# Patient Record
Sex: Female | Born: 1960 | Race: White | Hispanic: No | Marital: Married | Smoking: Former smoker
Health system: Southern US, Community
[De-identification: ages and names within clinical notes are randomized; demographics above are authoritative.]

## PROBLEM LIST (undated history)

## (undated) DIAGNOSIS — K648 Other hemorrhoids: Secondary | ICD-10-CM

## (undated) DIAGNOSIS — E162 Hypoglycemia, unspecified: Secondary | ICD-10-CM

## (undated) DIAGNOSIS — R7989 Other specified abnormal findings of blood chemistry: Secondary | ICD-10-CM

## (undated) DIAGNOSIS — E785 Hyperlipidemia, unspecified: Secondary | ICD-10-CM

## (undated) DIAGNOSIS — F419 Anxiety disorder, unspecified: Secondary | ICD-10-CM

## (undated) DIAGNOSIS — F32A Depression, unspecified: Secondary | ICD-10-CM

## (undated) DIAGNOSIS — F988 Other specified behavioral and emotional disorders with onset usually occurring in childhood and adolescence: Secondary | ICD-10-CM

## (undated) DIAGNOSIS — E119 Type 2 diabetes mellitus without complications: Secondary | ICD-10-CM

## (undated) DIAGNOSIS — IMO0002 Reserved for concepts with insufficient information to code with codable children: Secondary | ICD-10-CM

## (undated) DIAGNOSIS — K449 Diaphragmatic hernia without obstruction or gangrene: Secondary | ICD-10-CM

## (undated) DIAGNOSIS — R030 Elevated blood-pressure reading, without diagnosis of hypertension: Secondary | ICD-10-CM

## (undated) DIAGNOSIS — C801 Malignant (primary) neoplasm, unspecified: Secondary | ICD-10-CM

## (undated) DIAGNOSIS — K219 Gastro-esophageal reflux disease without esophagitis: Secondary | ICD-10-CM

## (undated) DIAGNOSIS — F329 Major depressive disorder, single episode, unspecified: Secondary | ICD-10-CM

## (undated) DIAGNOSIS — T7840XA Allergy, unspecified, initial encounter: Secondary | ICD-10-CM

## (undated) DIAGNOSIS — K8020 Calculus of gallbladder without cholecystitis without obstruction: Secondary | ICD-10-CM

## (undated) DIAGNOSIS — Q742 Other congenital malformations of lower limb(s), including pelvic girdle: Secondary | ICD-10-CM

## (undated) DIAGNOSIS — K2270 Barrett's esophagus without dysplasia: Secondary | ICD-10-CM

## (undated) DIAGNOSIS — K5790 Diverticulosis of intestine, part unspecified, without perforation or abscess without bleeding: Secondary | ICD-10-CM

## (undated) DIAGNOSIS — D649 Anemia, unspecified: Secondary | ICD-10-CM

## (undated) HISTORY — DX: Depression, unspecified: F32.A

## (undated) HISTORY — DX: Other specified behavioral and emotional disorders with onset usually occurring in childhood and adolescence: F98.8

## (undated) HISTORY — DX: Other congenital malformations of lower limb(s), including pelvic girdle: Q74.2

## (undated) HISTORY — DX: Type 2 diabetes mellitus without complications: E11.9

## (undated) HISTORY — DX: Anemia, unspecified: D64.9

## (undated) HISTORY — DX: Hypoglycemia, unspecified: E16.2

## (undated) HISTORY — DX: Anxiety disorder, unspecified: F41.9

## (undated) HISTORY — DX: Hyperlipidemia, unspecified: E78.5

## (undated) HISTORY — PX: HIATAL HERNIA REPAIR: SHX195

## (undated) HISTORY — DX: Gastro-esophageal reflux disease without esophagitis: K21.9

## (undated) HISTORY — DX: Barrett's esophagus without dysplasia: K22.70

## (undated) HISTORY — DX: Allergy, unspecified, initial encounter: T78.40XA

## (undated) HISTORY — DX: Calculus of gallbladder without cholecystitis without obstruction: K80.20

## (undated) HISTORY — DX: Diaphragmatic hernia without obstruction or gangrene: K44.9

## (undated) HISTORY — DX: Major depressive disorder, single episode, unspecified: F32.9

## (undated) HISTORY — DX: Other specified abnormal findings of blood chemistry: R79.89

## (undated) HISTORY — DX: Diverticulosis of intestine, part unspecified, without perforation or abscess without bleeding: K57.90

## (undated) HISTORY — PX: BREAST CYST EXCISION: SHX579

## (undated) HISTORY — DX: Elevated blood-pressure reading, without diagnosis of hypertension: R03.0

## (undated) HISTORY — DX: Reserved for concepts with insufficient information to code with codable children: IMO0002

## (undated) HISTORY — PX: LUMBAR LAMINECTOMY: SHX95

## (undated) HISTORY — DX: Other hemorrhoids: K64.8

## (undated) HISTORY — DX: Malignant (primary) neoplasm, unspecified: C80.1

## (undated) HISTORY — PX: CARDIAC CATHETERIZATION: SHX172

---

## 1999-03-04 ENCOUNTER — Encounter: Admission: RE | Admit: 1999-03-04 | Discharge: 1999-03-04 | Payer: Self-pay | Admitting: Obstetrics and Gynecology

## 1999-03-04 ENCOUNTER — Encounter: Payer: Self-pay | Admitting: Obstetrics and Gynecology

## 2000-03-04 ENCOUNTER — Encounter: Payer: Self-pay | Admitting: Obstetrics and Gynecology

## 2000-03-04 ENCOUNTER — Encounter: Admission: RE | Admit: 2000-03-04 | Discharge: 2000-03-04 | Payer: Self-pay | Admitting: Obstetrics and Gynecology

## 2000-12-01 ENCOUNTER — Ambulatory Visit (HOSPITAL_COMMUNITY): Admission: RE | Admit: 2000-12-01 | Discharge: 2000-12-02 | Payer: Self-pay | Admitting: Neurosurgery

## 2000-12-01 ENCOUNTER — Encounter: Payer: Self-pay | Admitting: Neurosurgery

## 2001-03-09 ENCOUNTER — Encounter: Admission: RE | Admit: 2001-03-09 | Discharge: 2001-03-09 | Payer: Self-pay | Admitting: Obstetrics and Gynecology

## 2001-03-09 ENCOUNTER — Encounter: Payer: Self-pay | Admitting: Obstetrics and Gynecology

## 2002-02-06 ENCOUNTER — Encounter: Payer: Self-pay | Admitting: Internal Medicine

## 2002-02-06 DIAGNOSIS — K648 Other hemorrhoids: Secondary | ICD-10-CM | POA: Insufficient documentation

## 2002-02-06 DIAGNOSIS — K5730 Diverticulosis of large intestine without perforation or abscess without bleeding: Secondary | ICD-10-CM | POA: Insufficient documentation

## 2002-02-06 HISTORY — PX: COLONOSCOPY: SHX174

## 2002-02-06 HISTORY — DX: Diverticulosis of large intestine without perforation or abscess without bleeding: K57.30

## 2002-03-15 ENCOUNTER — Encounter: Payer: Self-pay | Admitting: Obstetrics and Gynecology

## 2002-03-15 ENCOUNTER — Encounter: Admission: RE | Admit: 2002-03-15 | Discharge: 2002-03-15 | Payer: Self-pay | Admitting: Obstetrics and Gynecology

## 2003-03-25 ENCOUNTER — Encounter: Admission: RE | Admit: 2003-03-25 | Discharge: 2003-03-25 | Payer: Self-pay | Admitting: Obstetrics and Gynecology

## 2004-02-13 ENCOUNTER — Ambulatory Visit: Payer: Self-pay | Admitting: Family Medicine

## 2004-04-13 ENCOUNTER — Encounter: Admission: RE | Admit: 2004-04-13 | Discharge: 2004-04-13 | Payer: Self-pay | Admitting: Obstetrics and Gynecology

## 2004-04-14 ENCOUNTER — Ambulatory Visit: Payer: Self-pay | Admitting: Family Medicine

## 2004-04-15 ENCOUNTER — Ambulatory Visit: Payer: Self-pay | Admitting: Family Medicine

## 2004-05-13 ENCOUNTER — Ambulatory Visit: Payer: Self-pay | Admitting: Family Medicine

## 2004-05-14 ENCOUNTER — Ambulatory Visit: Payer: Self-pay | Admitting: Family Medicine

## 2004-05-18 ENCOUNTER — Ambulatory Visit: Payer: Self-pay | Admitting: Family Medicine

## 2004-05-26 ENCOUNTER — Ambulatory Visit: Payer: Self-pay | Admitting: Cardiology

## 2004-05-29 ENCOUNTER — Ambulatory Visit: Payer: Self-pay | Admitting: Cardiology

## 2004-06-04 ENCOUNTER — Inpatient Hospital Stay (HOSPITAL_BASED_OUTPATIENT_CLINIC_OR_DEPARTMENT_OTHER): Admission: RE | Admit: 2004-06-04 | Discharge: 2004-06-04 | Payer: Self-pay | Admitting: Cardiology

## 2004-06-04 ENCOUNTER — Ambulatory Visit: Payer: Self-pay | Admitting: Cardiology

## 2004-06-11 ENCOUNTER — Ambulatory Visit: Payer: Self-pay

## 2004-06-15 ENCOUNTER — Ambulatory Visit: Payer: Self-pay | Admitting: Family Medicine

## 2004-06-19 ENCOUNTER — Ambulatory Visit: Payer: Self-pay | Admitting: Cardiology

## 2004-06-24 ENCOUNTER — Ambulatory Visit: Payer: Self-pay | Admitting: Gastroenterology

## 2004-07-31 ENCOUNTER — Ambulatory Visit: Payer: Self-pay | Admitting: Gastroenterology

## 2004-07-31 ENCOUNTER — Encounter: Payer: Self-pay | Admitting: Gastroenterology

## 2004-08-10 ENCOUNTER — Ambulatory Visit: Payer: Self-pay | Admitting: Internal Medicine

## 2004-09-07 ENCOUNTER — Ambulatory Visit: Payer: Self-pay | Admitting: Gastroenterology

## 2004-09-07 ENCOUNTER — Ambulatory Visit: Payer: Self-pay | Admitting: Family Medicine

## 2004-12-07 ENCOUNTER — Ambulatory Visit: Payer: Self-pay | Admitting: Family Medicine

## 2004-12-23 ENCOUNTER — Ambulatory Visit: Payer: Self-pay | Admitting: Family Medicine

## 2005-01-04 ENCOUNTER — Ambulatory Visit: Payer: Self-pay | Admitting: Family Medicine

## 2005-01-26 ENCOUNTER — Ambulatory Visit: Payer: Self-pay | Admitting: Family Medicine

## 2005-02-05 ENCOUNTER — Ambulatory Visit: Payer: Self-pay | Admitting: Family Medicine

## 2005-04-20 ENCOUNTER — Encounter: Admission: RE | Admit: 2005-04-20 | Discharge: 2005-04-20 | Payer: Self-pay | Admitting: Obstetrics and Gynecology

## 2005-04-27 ENCOUNTER — Encounter: Admission: RE | Admit: 2005-04-27 | Discharge: 2005-04-27 | Payer: Self-pay | Admitting: Obstetrics and Gynecology

## 2005-11-02 ENCOUNTER — Encounter: Admission: RE | Admit: 2005-11-02 | Discharge: 2005-11-02 | Payer: Self-pay | Admitting: Family Medicine

## 2005-11-27 ENCOUNTER — Encounter (INDEPENDENT_AMBULATORY_CARE_PROVIDER_SITE_OTHER): Payer: Self-pay | Admitting: Internal Medicine

## 2005-11-27 LAB — CONVERTED CEMR LAB

## 2006-02-15 ENCOUNTER — Ambulatory Visit: Payer: Self-pay | Admitting: Family Medicine

## 2006-04-22 ENCOUNTER — Encounter: Admission: RE | Admit: 2006-04-22 | Discharge: 2006-04-22 | Payer: Self-pay | Admitting: Family Medicine

## 2006-06-24 ENCOUNTER — Ambulatory Visit: Payer: Self-pay | Admitting: Gastroenterology

## 2006-08-17 ENCOUNTER — Ambulatory Visit: Payer: Self-pay | Admitting: Family Medicine

## 2006-08-17 DIAGNOSIS — F411 Generalized anxiety disorder: Secondary | ICD-10-CM | POA: Insufficient documentation

## 2006-08-17 DIAGNOSIS — R7989 Other specified abnormal findings of blood chemistry: Secondary | ICD-10-CM | POA: Insufficient documentation

## 2006-08-17 LAB — CONVERTED CEMR LAB: Blood Glucose, Fingerstick: 114

## 2006-08-31 ENCOUNTER — Ambulatory Visit: Payer: Self-pay | Admitting: Family Medicine

## 2006-08-31 DIAGNOSIS — E7800 Pure hypercholesterolemia, unspecified: Secondary | ICD-10-CM | POA: Insufficient documentation

## 2006-08-31 DIAGNOSIS — E162 Hypoglycemia, unspecified: Secondary | ICD-10-CM | POA: Insufficient documentation

## 2006-08-31 DIAGNOSIS — F329 Major depressive disorder, single episode, unspecified: Secondary | ICD-10-CM | POA: Insufficient documentation

## 2006-09-01 LAB — CONVERTED CEMR LAB
AST: 22 units/L (ref 0–37)
BUN: 8 mg/dL (ref 6–23)
Calcium: 8.8 mg/dL (ref 8.4–10.5)
Chloride: 107 meq/L (ref 96–112)
Cholesterol: 196 mg/dL (ref 0–200)
Creatinine, Ser: 0.8 mg/dL (ref 0.4–1.2)
Glucose, Bld: 102 mg/dL — ABNORMAL HIGH (ref 70–99)
Potassium: 3.8 meq/L (ref 3.5–5.1)
Sodium: 140 meq/L (ref 135–145)
Total CHOL/HDL Ratio: 4.4

## 2006-09-21 ENCOUNTER — Telehealth (INDEPENDENT_AMBULATORY_CARE_PROVIDER_SITE_OTHER): Payer: Self-pay | Admitting: *Deleted

## 2006-09-27 ENCOUNTER — Ambulatory Visit: Payer: Self-pay | Admitting: Internal Medicine

## 2006-09-27 DIAGNOSIS — K219 Gastro-esophageal reflux disease without esophagitis: Secondary | ICD-10-CM | POA: Insufficient documentation

## 2007-03-01 ENCOUNTER — Ambulatory Visit: Payer: Self-pay | Admitting: Family Medicine

## 2007-03-09 LAB — CONVERTED CEMR LAB
Cholesterol: 269 mg/dL (ref 0–200)
Direct LDL: 177.3 mg/dL
HDL: 41.9 mg/dL (ref >39.0)
Total CHOL/HDL Ratio: 6.4
Triglycerides: 142 mg/dL (ref 0–149)
VLDL: 28 mg/dL (ref 0–40)

## 2007-03-10 ENCOUNTER — Ambulatory Visit: Payer: Self-pay | Admitting: Family Medicine

## 2007-03-10 DIAGNOSIS — J069 Acute upper respiratory infection, unspecified: Secondary | ICD-10-CM | POA: Insufficient documentation

## 2007-03-10 DIAGNOSIS — R030 Elevated blood-pressure reading, without diagnosis of hypertension: Secondary | ICD-10-CM | POA: Insufficient documentation

## 2007-03-10 LAB — CONVERTED CEMR LAB: Rapid Strep: NEGATIVE

## 2007-03-29 ENCOUNTER — Telehealth (INDEPENDENT_AMBULATORY_CARE_PROVIDER_SITE_OTHER): Payer: Self-pay | Admitting: *Deleted

## 2007-04-11 ENCOUNTER — Ambulatory Visit: Payer: Self-pay | Admitting: Internal Medicine

## 2007-04-13 ENCOUNTER — Encounter (INDEPENDENT_AMBULATORY_CARE_PROVIDER_SITE_OTHER): Payer: Self-pay | Admitting: Internal Medicine

## 2007-04-14 LAB — CONVERTED CEMR LAB
AST: 23 units/L (ref 0–37)
Cholesterol: 157 mg/dL (ref 0–200)
HDL: 48.8 mg/dL (ref >39.0)
VLDL: 25 mg/dL (ref 0–40)

## 2007-04-26 ENCOUNTER — Telehealth (INDEPENDENT_AMBULATORY_CARE_PROVIDER_SITE_OTHER): Payer: Self-pay | Admitting: Internal Medicine

## 2007-05-02 ENCOUNTER — Encounter (INDEPENDENT_AMBULATORY_CARE_PROVIDER_SITE_OTHER): Payer: Self-pay | Admitting: Internal Medicine

## 2007-05-26 ENCOUNTER — Ambulatory Visit: Payer: Self-pay | Admitting: Family Medicine

## 2007-05-26 ENCOUNTER — Encounter (INDEPENDENT_AMBULATORY_CARE_PROVIDER_SITE_OTHER): Payer: Self-pay | Admitting: Internal Medicine

## 2007-05-26 DIAGNOSIS — R1011 Right upper quadrant pain: Secondary | ICD-10-CM | POA: Insufficient documentation

## 2007-05-26 DIAGNOSIS — K8020 Calculus of gallbladder without cholecystitis without obstruction: Secondary | ICD-10-CM | POA: Insufficient documentation

## 2007-05-26 HISTORY — DX: Calculus of gallbladder without cholecystitis without obstruction: K80.20

## 2007-05-28 LAB — CONVERTED CEMR LAB: Lipase: 21 units/L (ref 11.0–59.0)

## 2007-06-01 ENCOUNTER — Encounter (INDEPENDENT_AMBULATORY_CARE_PROVIDER_SITE_OTHER): Payer: Self-pay | Admitting: Internal Medicine

## 2007-06-02 ENCOUNTER — Telehealth: Payer: Self-pay | Admitting: Family Medicine

## 2007-06-16 ENCOUNTER — Encounter (INDEPENDENT_AMBULATORY_CARE_PROVIDER_SITE_OTHER): Payer: Self-pay | Admitting: General Surgery

## 2007-06-16 ENCOUNTER — Ambulatory Visit (HOSPITAL_COMMUNITY): Admission: RE | Admit: 2007-06-16 | Discharge: 2007-06-16 | Payer: Self-pay | Admitting: General Surgery

## 2007-06-16 HISTORY — PX: CHOLECYSTECTOMY: SHX55

## 2007-07-18 ENCOUNTER — Encounter: Payer: Self-pay | Admitting: Family Medicine

## 2007-07-24 ENCOUNTER — Ambulatory Visit: Payer: Self-pay | Admitting: Family Medicine

## 2007-07-24 DIAGNOSIS — F988 Other specified behavioral and emotional disorders with onset usually occurring in childhood and adolescence: Secondary | ICD-10-CM | POA: Insufficient documentation

## 2007-07-28 ENCOUNTER — Encounter (INDEPENDENT_AMBULATORY_CARE_PROVIDER_SITE_OTHER): Payer: Self-pay | Admitting: Internal Medicine

## 2007-08-07 ENCOUNTER — Telehealth: Payer: Self-pay | Admitting: Family Medicine

## 2007-08-24 ENCOUNTER — Ambulatory Visit: Payer: Self-pay | Admitting: Family Medicine

## 2007-08-24 ENCOUNTER — Telehealth (INDEPENDENT_AMBULATORY_CARE_PROVIDER_SITE_OTHER): Payer: Self-pay | Admitting: Internal Medicine

## 2007-10-11 ENCOUNTER — Telehealth: Payer: Self-pay | Admitting: Family Medicine

## 2007-10-26 ENCOUNTER — Telehealth (INDEPENDENT_AMBULATORY_CARE_PROVIDER_SITE_OTHER): Payer: Self-pay | Admitting: Internal Medicine

## 2007-11-27 ENCOUNTER — Telehealth: Payer: Self-pay | Admitting: Family Medicine

## 2008-01-08 ENCOUNTER — Telehealth (INDEPENDENT_AMBULATORY_CARE_PROVIDER_SITE_OTHER): Payer: Self-pay | Admitting: Internal Medicine

## 2008-01-25 ENCOUNTER — Ambulatory Visit: Payer: Self-pay | Admitting: Family Medicine

## 2008-02-27 ENCOUNTER — Telehealth (INDEPENDENT_AMBULATORY_CARE_PROVIDER_SITE_OTHER): Payer: Self-pay | Admitting: *Deleted

## 2008-02-29 ENCOUNTER — Telehealth (INDEPENDENT_AMBULATORY_CARE_PROVIDER_SITE_OTHER): Payer: Self-pay | Admitting: *Deleted

## 2008-08-06 ENCOUNTER — Encounter (INDEPENDENT_AMBULATORY_CARE_PROVIDER_SITE_OTHER): Payer: Self-pay | Admitting: Internal Medicine

## 2008-08-06 ENCOUNTER — Ambulatory Visit: Payer: Self-pay | Admitting: Family Medicine

## 2008-08-06 DIAGNOSIS — R5381 Other malaise: Secondary | ICD-10-CM | POA: Insufficient documentation

## 2008-08-06 DIAGNOSIS — R5383 Other fatigue: Secondary | ICD-10-CM

## 2008-08-06 DIAGNOSIS — R079 Chest pain, unspecified: Secondary | ICD-10-CM | POA: Insufficient documentation

## 2008-08-07 ENCOUNTER — Ambulatory Visit: Payer: Self-pay | Admitting: Family Medicine

## 2008-08-07 ENCOUNTER — Encounter (INDEPENDENT_AMBULATORY_CARE_PROVIDER_SITE_OTHER): Payer: Self-pay | Admitting: Internal Medicine

## 2008-08-07 DIAGNOSIS — IMO0002 Reserved for concepts with insufficient information to code with codable children: Secondary | ICD-10-CM | POA: Insufficient documentation

## 2008-08-07 DIAGNOSIS — Q742 Other congenital malformations of lower limb(s), including pelvic girdle: Secondary | ICD-10-CM | POA: Insufficient documentation

## 2008-08-08 LAB — CONVERTED CEMR LAB
AST: 28 units/L (ref 0–37)
Alkaline Phosphatase: 60 units/L (ref 39–117)
BUN: 9 mg/dL (ref 6–23)
Basophils Absolute: 0 10*3/uL (ref 0.0–0.1)
CO2: 26 meq/L (ref 19–32)
Direct LDL: 110.9 mg/dL
Eosinophils Absolute: 0.2 10*3/uL (ref 0.0–0.7)
GFR calc non Af Amer: 80.89 mL/min (ref >60)
Glucose, Bld: 115 mg/dL — ABNORMAL HIGH (ref 70–99)
HCT: 35.9 % — ABNORMAL LOW (ref 36.0–46.0)
Hemoglobin: 12.1 g/dL (ref 12.0–15.0)
MCV: 76.4 fL — ABNORMAL LOW (ref 78.0–100.0)
Monocytes Absolute: 0.7 10*3/uL (ref 0.1–1.0)
Monocytes Relative: 8.2 % (ref 3.0–12.0)
Neutro Abs: 6.2 10*3/uL (ref 1.4–7.7)
RBC: 4.7 M/uL (ref 3.87–5.11)
Sodium: 141 meq/L (ref 135–145)
Total Bilirubin: 0.5 mg/dL (ref 0.3–1.2)
Triglycerides: 226 mg/dL — ABNORMAL HIGH (ref 0.0–149.0)
WBC: 8.9 10*3/uL (ref 4.5–10.5)

## 2008-08-09 ENCOUNTER — Encounter: Admission: RE | Admit: 2008-08-09 | Discharge: 2008-08-09 | Payer: Self-pay | Admitting: Family Medicine

## 2008-08-14 ENCOUNTER — Encounter (INDEPENDENT_AMBULATORY_CARE_PROVIDER_SITE_OTHER): Payer: Self-pay | Admitting: Internal Medicine

## 2008-09-19 ENCOUNTER — Ambulatory Visit: Payer: Self-pay | Admitting: Family Medicine

## 2008-09-19 DIAGNOSIS — R1310 Dysphagia, unspecified: Secondary | ICD-10-CM | POA: Insufficient documentation

## 2008-09-23 ENCOUNTER — Telehealth: Payer: Self-pay | Admitting: Family Medicine

## 2008-10-01 ENCOUNTER — Telehealth: Payer: Self-pay | Admitting: Family Medicine

## 2008-10-09 ENCOUNTER — Ambulatory Visit: Payer: Self-pay | Admitting: Family Medicine

## 2008-10-09 ENCOUNTER — Telehealth: Payer: Self-pay | Admitting: Internal Medicine

## 2008-10-09 DIAGNOSIS — K5900 Constipation, unspecified: Secondary | ICD-10-CM | POA: Insufficient documentation

## 2008-10-09 DIAGNOSIS — Z8719 Personal history of other diseases of the digestive system: Secondary | ICD-10-CM | POA: Insufficient documentation

## 2008-10-09 DIAGNOSIS — E785 Hyperlipidemia, unspecified: Secondary | ICD-10-CM | POA: Insufficient documentation

## 2008-10-09 DIAGNOSIS — E1169 Type 2 diabetes mellitus with other specified complication: Secondary | ICD-10-CM | POA: Insufficient documentation

## 2008-10-09 HISTORY — DX: Constipation, unspecified: K59.00

## 2008-10-10 ENCOUNTER — Ambulatory Visit: Payer: Self-pay | Admitting: Internal Medicine

## 2008-10-14 ENCOUNTER — Encounter: Payer: Self-pay | Admitting: Internal Medicine

## 2008-10-14 ENCOUNTER — Ambulatory Visit: Payer: Self-pay | Admitting: Internal Medicine

## 2008-10-16 ENCOUNTER — Telehealth: Payer: Self-pay | Admitting: Internal Medicine

## 2008-10-16 ENCOUNTER — Encounter: Payer: Self-pay | Admitting: Internal Medicine

## 2008-10-16 DIAGNOSIS — K2270 Barrett's esophagus without dysplasia: Secondary | ICD-10-CM | POA: Insufficient documentation

## 2008-10-16 DIAGNOSIS — K449 Diaphragmatic hernia without obstruction or gangrene: Secondary | ICD-10-CM | POA: Insufficient documentation

## 2008-10-16 HISTORY — DX: Diaphragmatic hernia without obstruction or gangrene: K44.9

## 2008-10-17 ENCOUNTER — Encounter: Payer: Self-pay | Admitting: Internal Medicine

## 2008-10-21 ENCOUNTER — Ambulatory Visit (HOSPITAL_COMMUNITY): Admission: RE | Admit: 2008-10-21 | Discharge: 2008-10-21 | Payer: Self-pay | Admitting: Internal Medicine

## 2008-10-22 ENCOUNTER — Encounter (INDEPENDENT_AMBULATORY_CARE_PROVIDER_SITE_OTHER): Payer: Self-pay | Admitting: Internal Medicine

## 2008-11-21 ENCOUNTER — Telehealth: Payer: Self-pay | Admitting: Internal Medicine

## 2008-11-28 ENCOUNTER — Ambulatory Visit: Payer: Self-pay | Admitting: Internal Medicine

## 2008-11-29 ENCOUNTER — Telehealth (INDEPENDENT_AMBULATORY_CARE_PROVIDER_SITE_OTHER): Payer: Self-pay | Admitting: Internal Medicine

## 2008-12-11 ENCOUNTER — Telehealth: Payer: Self-pay | Admitting: Internal Medicine

## 2008-12-16 ENCOUNTER — Telehealth: Payer: Self-pay | Admitting: Family Medicine

## 2008-12-17 ENCOUNTER — Telehealth (INDEPENDENT_AMBULATORY_CARE_PROVIDER_SITE_OTHER): Payer: Self-pay | Admitting: Internal Medicine

## 2008-12-20 ENCOUNTER — Telehealth (INDEPENDENT_AMBULATORY_CARE_PROVIDER_SITE_OTHER): Payer: Self-pay | Admitting: Internal Medicine

## 2008-12-23 ENCOUNTER — Ambulatory Visit: Payer: Self-pay | Admitting: Internal Medicine

## 2008-12-23 ENCOUNTER — Ambulatory Visit (HOSPITAL_COMMUNITY): Admission: RE | Admit: 2008-12-23 | Discharge: 2008-12-23 | Payer: Self-pay | Admitting: Internal Medicine

## 2009-01-06 ENCOUNTER — Encounter: Payer: Self-pay | Admitting: Family Medicine

## 2009-01-07 ENCOUNTER — Encounter (INDEPENDENT_AMBULATORY_CARE_PROVIDER_SITE_OTHER): Payer: Self-pay | Admitting: *Deleted

## 2009-01-10 ENCOUNTER — Encounter (INDEPENDENT_AMBULATORY_CARE_PROVIDER_SITE_OTHER): Payer: Self-pay | Admitting: Internal Medicine

## 2009-01-13 ENCOUNTER — Encounter (INDEPENDENT_AMBULATORY_CARE_PROVIDER_SITE_OTHER): Payer: Self-pay | Admitting: Internal Medicine

## 2009-01-22 ENCOUNTER — Encounter: Admission: RE | Admit: 2009-01-22 | Discharge: 2009-01-22 | Payer: Self-pay | Admitting: Neurosurgery

## 2009-02-05 ENCOUNTER — Encounter: Admission: RE | Admit: 2009-02-05 | Discharge: 2009-02-05 | Payer: Self-pay | Admitting: Neurosurgery

## 2009-02-11 ENCOUNTER — Encounter (INDEPENDENT_AMBULATORY_CARE_PROVIDER_SITE_OTHER): Payer: Self-pay | Admitting: *Deleted

## 2009-02-12 ENCOUNTER — Inpatient Hospital Stay (HOSPITAL_COMMUNITY): Admission: AD | Admit: 2009-02-12 | Discharge: 2009-02-14 | Payer: Self-pay | Admitting: Surgery

## 2009-02-13 ENCOUNTER — Encounter (INDEPENDENT_AMBULATORY_CARE_PROVIDER_SITE_OTHER): Payer: Self-pay | Admitting: Internal Medicine

## 2009-02-28 ENCOUNTER — Ambulatory Visit: Payer: Self-pay | Admitting: Family Medicine

## 2009-02-28 DIAGNOSIS — D6489 Other specified anemias: Secondary | ICD-10-CM | POA: Insufficient documentation

## 2009-02-28 LAB — CONVERTED CEMR LAB
Basophils Relative: 0.8 % (ref 0.0–3.0)
Eosinophils Relative: 3.4 % (ref 0.0–5.0)
HCT: 36.7 % (ref 36.0–46.0)
Lymphocytes Relative: 29.1 % (ref 12.0–46.0)
MCHC: 32.9 g/dL (ref 30.0–36.0)
MCV: 83.3 fL (ref 78.0–100.0)
RDW: 16.3 % — ABNORMAL HIGH (ref 11.5–14.6)

## 2009-03-04 ENCOUNTER — Ambulatory Visit: Payer: Self-pay | Admitting: Family Medicine

## 2009-06-06 ENCOUNTER — Ambulatory Visit: Payer: Self-pay | Admitting: Family Medicine

## 2009-06-06 LAB — CONVERTED CEMR LAB
AST: 24 units/L (ref 0–37)
Alkaline Phosphatase: 58 units/L (ref 39–117)
Bilirubin, Direct: 0 mg/dL (ref 0.0–0.3)
Chloride: 112 meq/L (ref 96–112)
Cholesterol: 143 mg/dL (ref 0–200)
Glucose, Bld: 120 mg/dL — ABNORMAL HIGH (ref 70–99)
HDL: 53.3 mg/dL (ref >39.00)
Potassium: 4 meq/L (ref 3.5–5.1)
Sodium: 143 meq/L (ref 135–145)
Total Bilirubin: 0.5 mg/dL (ref 0.3–1.2)
Total CHOL/HDL Ratio: 3
VLDL: 25.4 mg/dL (ref 0.0–40.0)

## 2009-07-01 ENCOUNTER — Telehealth: Payer: Self-pay | Admitting: Family Medicine

## 2009-07-08 ENCOUNTER — Ambulatory Visit: Payer: Self-pay | Admitting: Family Medicine

## 2009-07-08 DIAGNOSIS — M7730 Calcaneal spur, unspecified foot: Secondary | ICD-10-CM | POA: Insufficient documentation

## 2009-07-08 DIAGNOSIS — E669 Obesity, unspecified: Secondary | ICD-10-CM | POA: Insufficient documentation

## 2009-07-09 ENCOUNTER — Encounter: Payer: Self-pay | Admitting: Family Medicine

## 2009-10-14 ENCOUNTER — Encounter: Admission: RE | Admit: 2009-10-14 | Discharge: 2009-10-14 | Payer: Self-pay | Admitting: Family Medicine

## 2009-11-04 ENCOUNTER — Encounter (INDEPENDENT_AMBULATORY_CARE_PROVIDER_SITE_OTHER): Payer: Self-pay | Admitting: *Deleted

## 2009-11-24 ENCOUNTER — Telehealth: Payer: Self-pay | Admitting: Family Medicine

## 2009-11-24 ENCOUNTER — Ambulatory Visit: Payer: Self-pay | Admitting: Family Medicine

## 2009-12-04 ENCOUNTER — Ambulatory Visit: Payer: Self-pay | Admitting: Family Medicine

## 2009-12-04 DIAGNOSIS — L03119 Cellulitis of unspecified part of limb: Secondary | ICD-10-CM

## 2009-12-04 DIAGNOSIS — L02419 Cutaneous abscess of limb, unspecified: Secondary | ICD-10-CM | POA: Insufficient documentation

## 2009-12-09 ENCOUNTER — Ambulatory Visit: Payer: Self-pay | Admitting: Family Medicine

## 2009-12-10 ENCOUNTER — Encounter: Payer: Self-pay | Admitting: Family Medicine

## 2009-12-10 LAB — CONVERTED CEMR LAB
ALT: 17 units/L (ref 0–35)
HDL: 45.6 mg/dL (ref >39.00)
LDL Cholesterol: 99 mg/dL (ref 0–99)
Total CHOL/HDL Ratio: 4

## 2009-12-25 ENCOUNTER — Ambulatory Visit: Payer: Self-pay | Admitting: Family Medicine

## 2009-12-29 ENCOUNTER — Ambulatory Visit: Payer: Self-pay | Admitting: Family Medicine

## 2010-01-26 ENCOUNTER — Telehealth: Payer: Self-pay | Admitting: Family Medicine

## 2010-04-18 ENCOUNTER — Encounter: Payer: Self-pay | Admitting: Obstetrics and Gynecology

## 2010-04-19 ENCOUNTER — Encounter: Payer: Self-pay | Admitting: Family Medicine

## 2010-04-28 NOTE — Progress Notes (Signed)
Summary: Phenteramine  Phone Note Refill Request Message from:  Patient on July 01, 2009 10:04 AM  Refills Requested: Medication #1:  PHENTERMINE HCL 15 MG CAPS 1 tab by mouth every morning before breakfast.. [facility] [place]  [phone]   Patient has appt. next week but will run out of medication on Saturday.   Method Requested: Electronic Initial call taken by: Delilah Shan CMA Duncan Dull),  July 01, 2009 10:04 AM  Follow-up for Phone Call        Medication phoned to pharmacy.  Follow-up by: Delilah Shan CMA (AAMA),  July 01, 2009 10:42 AM    Prescriptions: PHENTERMINE HCL 15 MG CAPS (PHENTERMINE HCL) 1 tab by mouth every morning before breakfast.  #30 x 0   Entered and Authorized by:   Ruthe Mannan MD   Signed by:   Ruthe Mannan MD on 07/01/2009   Method used:   Handwritten   RxID:   [id number]

## 2010-04-28 NOTE — Consult Note (Signed)
Summary: [facility]   Imported By: Lanelle Bal 08/13/2009 11:55:04  _____________________________________________________________________  External Attachment:    Type:   Image     Comment:   External Document

## 2010-04-28 NOTE — Assessment & Plan Note (Signed)
Summary: 30 MIN F/U/[facility]   Vital Signs:  Patient profile:   50 year old female Height:      66.5 inches Weight:      204.13 pounds BMI:     32.57 Temp:     98.3 degrees F oral Pulse rate:   84 / minute Pulse rhythm:   regular BP sitting:   116 / 80  (left arm) Cuff size:   large  Vitals Entered By: Delilah Shan CMA Duncan Dull) (June 06, 2009 8:44 AM) CC: 30 minute follow up (BDB), Depression   History of Present Illness: 50 yo pt new to me here to establish care ([facility]).  HLD- has been on Vytorin, doing well.  Has not had lipid panel checked since May 2010, at that time had elevated TGs.  Obesity- has really been working on weight loss.  Lost 20 pounds 6 months ago but gained it all back.  Exercising 4-5 days a week for at least 30 minutes.  Trying to cut out carbohydrates, really feels her diet has improved but she is stuck.  She is interested in weight loss supplements.  No h/o HTN, palpitations.  Depression History:      The patient denies insomnia, hypersomnia, psychomotor agitation, psychomotor retardation, fatigue (loss of energy), feelings of worthlessness (guilt), impaired concentration (indecisiveness), and recurrent thoughts of death or suicide.         Allergies: 1)  Zoloft (Sertraline Hcl)  Review of Systems      See HPI General:  Denies malaise. GI:  Denies abdominal pain and change in bowel habits. Derm:  Denies rash. Psych:  Denies easily angered, easily tearful, irritability, mental problems, panic attacks, sense of great danger, suicidal thoughts/plans, thoughts of violence, unusual visions or sounds, and thoughts /plans of harming others. Endo:  Denies cold intolerance and heat intolerance.  Physical Exam  General:  Well developed, well nourished, no acute distress. weight stable. Mouth:  No deformity or lesions, dentition normal. Lungs:  Clear throughout to auscultation. Heart:  Regular rate and rhythm; no murmurs, rubs,  or  bruits. Abdomen:  soft abdomen minimal tenderness in epigastrium normoactive bowel sounds. No distention.  Extremities:  No clubbing, cyanosis, edema or deformities noted. Psych:  Alert and cooperative. Normal mood and affect.   Impression & Recommendations:  Problem # 1:  HYPERLIPIDEMIA (ICD-272.4) Assessment Unchanged reheck FLP today along with BMET and LFTs given elevated TGs. Her updated medication list for this problem includes:    Vytorin 10-40 Mg Tabs (Ezetimibe-simvastatin) .Marland Kitchen... Take one by mouth daily  Orders: Venipuncture ([address]) TLB-Lipid Panel (80061-LIPID) TLB-Hepatic/Liver Function Pnl (80076-HEPATIC) TLB-BMP (Basic Metabolic Panel-BMET) (80048-METABOL)  Problem # 2:  DEPRESSION (ICD-311) Assessment: Unchanged stable.  Continue current meds. The following medications were removed from the medication list:    Alprazolam 0.25 Mg Tabs (Alprazolam) .Marland Kitchen... Take 1 tablet by mouth once a day prn Her updated medication list for this problem includes:    Effexor 37.5 Mg Tabs (Venlafaxine hcl) .Marland Kitchen... Take 2  tablet by mouth once a day  Problem # 3:  OBESITY, UNSPECIFIED (ICD-278.00) Assessment: Unchanged Time spent with patient 25 minutes, more than 50% of this time was spent counseling patient on weight loss strategies. Will try phenteramine, discussed side effects and monitoring, given up to date hand out. She will keep a 24 hour food recall for the next month and come see me in one month.  Complete Medication List: 1)  Vytorin 10-40 Mg Tabs (Ezetimibe-simvastatin) .... Take one by mouth  daily 2)  Effexor 37.5 Mg Tabs (Venlafaxine hcl) .... Take 2  tablet by mouth once a day 3)  Phentermine Hcl 15 Mg Caps (Phentermine hcl) .Marland Kitchen.. 1 tab by mouth every morning before breakfast. Prescriptions: PHENTERMINE HCL 15 MG CAPS (PHENTERMINE HCL) 1 tab by mouth every morning before breakfast.  #30 x 0   Entered and Authorized by:   Ruthe Mannan MD   Signed by:   Ruthe Mannan MD on  06/06/2009   Method used:   Print then Give to Patient   RxID:   [id number]   Current Allergies (reviewed today): ZOLOFT (SERTRALINE HCL)  Flex Sig Next Due:  Not Indicated Last Colonoscopy:  Location:  [facility].   (02/06/2002 4:11:21 PM) Colonoscopy Result Date:  02/06/2009 Colonoscopy Next Due:  5 yr Hemoccult Next Due:  Not Indicated

## 2010-04-28 NOTE — Assessment & Plan Note (Signed)
Summary: LEGS/DLO   Vital Signs:  Patient profile:   50 year old female Height:      66.5 inches Weight:      205 pounds BMI:     32.71 Temp:     98.6 degrees F oral Pulse rate:   76 / minute Pulse rhythm:   regular BP sitting:   110 / 70  (right arm) Cuff size:   regular  Vitals Entered By: Linde Gillis CMA Duncan Dull) (December 04, 2009 4:00 PM) CC: fell a few days ago and hurt leg and knees   History of Present Illness: 50 yo here s/p fall.  [place]y Seat in parking lot three days ago, tripped over a curb. Crapped both of her knees and left shin on the pavement. Noticed two days ago some warmth and redness around scabs, redness is streaking. No fevers, chills, nausea or vomiting.  Not draining pus.  Putting neosporin on scrapes.  Current Medications (verified): 1)  Effexor 37.5 Mg  Tabs (Venlafaxine Hcl) .... Take 2  Tablet By Mouth Once A Day 2)  Phentermine Hcl 15 Mg Caps (Phentermine Hcl) .Marland Kitchen.. 1 Tab By Mouth Every Morning Before Breakfast and 1 Tab Before Lunch 3)  Simvastatin 40 Mg Tabs (Simvastatin) .... Take One Tablet At Bedtime 4)  Venlafaxine Hcl 37.5 Mg Tabs (Venlafaxine Hcl) .Marland Kitchen.. 1 Tab By Two Times A Day. 5)  Doxycycline Hyclate 100 Mg Caps (Doxycycline Hyclate) .... 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) HERNIATED DISC (ICD-722.2) FATIGUE (ICD-780.79) HEALTH SCREENING (ICD-V70.0) CHEST PAIN (ICD-786.50) ADD (ICD-314.00) CHOLELITHIASIS (ICD-574.20) RUQ PAIN (ICD-789.01) ELEVATED BLOOD PRESSURE WITHOUT DIAGNOSIS OF HYPERTENSION (ICD-796.2) URI (ICD-465.9) G E R D (ICD-530.81) HYPERCHOLESTEROLEMIA, PURE  (ICD-272.0) DEPRESSION (ICD-311) ANXIETY (ICD-300.00) HYPOGLYCEMIA (ICD-251.2) ANXIETY (ICD-300.00) HYPERGLYCEMIA (ICD-790.6)    Past Surgical History: Last updated: 02/13/2009 Laparoscopic Choleycystectomy (Dr Lindie Spruce) 06/16/2007 Colonoscopy showing diverticula and internal hemorrhoids 02/06/2002 ( Dr. Juanda Chance) Cardiolite normal with ef 76% 02/14/2002 Echo EF WNL TR M.R.,T.R 05/2004 C-Section Left Breast Cystectomy Laminectomy EGD--09/2008--hiatal hernia, esophagitis/Barrett's on bx 02/12/2009--laprosscopic fundoplication with repair of hiatal hernia--[facility]  Family History: Last updated: 10/10/2008 Father:   bypass surg hypertension, diabetes Mother: alive Siblings: one younger sister CV + MGM died with MI, PGF died with MI HBP + father DM + father and Mat aunt Breast/ovarian/uterine cancer none Depression none ETOH/drug abuse none Family History of Colon Cancer: PGM Family History of Prostate Cancer: MGF Family History of Colon Polyps: MGM, Mother  Social History: Last updated: 10/10/2008 Marital Status: Married Children: two, 1 girl, 1 boy Occupation: Film/video editor, 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  Alcohol Use - no Daily Caffeine Use 1 cup Illicit Drug Use - no  Risk Factors: Caffeine Use: 2 (03/10/2007) Exercise: no (03/10/2007)  Risk Factors: Smoking Status: quit (10/10/2008) Passive Smoke Exposure: no (03/10/2007)  Review of Systems      See HPI General:  Denies chills and fever. GI:  Denies abdominal pain, nausea, and vomiting.  Physical Exam  General:  Well developed, well nourished, no acute distress.  Skin:  multiple abrasions, scabbed over on bilateral lower extremities, largest on left shin, 6 inches long. Surrounded by warmth and streaking erythema, non tender to palpation, no drainage. Psych:  Alert and cooperative. Normal mood  and affect.   Impression & Recommendations:  Problem # 1:  CELLULITIS, LEGS  (ICD-682.6) Assessment New Treat with doxycycline 100 mg two times a day. Advised pt to needs to be seen immediately if she spikes a temperature or develops any other systemic symptoms such as nausea, vomiting. Her updated medication list for this problem includes:    Doxycycline Hyclate 100 Mg Caps (Doxycycline hyclate) .Marland Kitchen... Take 1 tab twice a day x 10 days  Complete Medication List: 1)  Effexor 37.5 Mg Tabs (Venlafaxine hcl) .... Take 2  tablet by mouth once a day 2)  Phentermine Hcl 15 Mg Caps (Phentermine hcl) .Marland Kitchen.. 1 tab by mouth every morning before breakfast and 1 tab before lunch 3)  Simvastatin 40 Mg Tabs (Simvastatin) .... Take one tablet at bedtime 4)  Venlafaxine Hcl 37.5 Mg Tabs (Venlafaxine hcl) .Marland Kitchen.. 1 tab by two times a day. 5)  Doxycycline Hyclate 100 Mg Caps (Doxycycline hyclate) .... Take 1 tab twice a day x 10 days Prescriptions: DOXYCYCLINE HYCLATE 100 MG CAPS (DOXYCYCLINE HYCLATE) Take 1 tab twice a day x 10 days  #20 x 0   Entered and Authorized by:   Ruthe Mannan MD   Signed by:   Ruthe Mannan MD on 12/04/2009   Method used:   [facility]        [facility]  [place] (retail)       [address]       [place], [place]  [address]       Ph: [phone] or [phone]       Fax: [phone]   RxID:   [id number]   Current Allergies (reviewed today): ZOLOFT (SERTRALINE HCL)

## 2010-04-28 NOTE — Letter (Signed)
Summary: Generic Letter  [place] [facility]  [address] [place], [place] [address]   Phone: [phone]  Fax: [phone]    12/10/2009  ZENORA KARPEL [address] [place], [place]  [address]  Dear Ms. Rosetti,     We have received your lab results and your cholesterol and liver function looks great!  Enclosed you will find a copy of your labs.      Sincerely,   Linde Gillis CMA [facility]) for Dr. Ruthe Mannan

## 2010-04-28 NOTE — Assessment & Plan Note (Signed)
Summary: F/U/DLO   Vital Signs:  Patient profile:   50 year old female Height:      66.5 inches Weight:      204 pounds BMI:     32.55 Temp:     98.3 degrees F oral Pulse rate:   68 / minute Pulse rhythm:   regular BP sitting:   102 / 60  (left arm) Cuff size:   regular  Vitals Entered By: Linde Gillis CMA Duncan Dull) (December 29, 2009 8:56 AM) CC: follow-up visit   History of Present Illness: 50 yo pt  here to discuss medications.  Obesity- has really been working on weight loss.  Lost 20 pounds last year but gained it all back.  Exercising 4-5 days a week for at least 30 minutes.  We started Phenteramine 15 mg daily several months ago.  Did not loose much weight, was not exercisign than and not eating right.  Did not have paliptations, nausea, CP, SOB. Tried it again, still has not lost weight.  She knows that she needs to find time to exercise.     Depression- feels like she is doing fine on generic Venlafaxine.  Denies any signs or symptoms of depression or anxiety.  Current Medications (verified): 1)  Simvastatin 40 Mg Tabs (Simvastatin) .... Take One Tablet At Bedtime 2)  Venlafaxine Hcl 37.5 Mg Tabs (Venlafaxine Hcl) .Marland Kitchen.. 1 Tab By Two Times A Day.  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) HERNIATED DISC (ICD-722.2) FATIGUE (ICD-780.79) HEALTH SCREENING (ICD-V70.0) CHEST PAIN (ICD-786.50) ADD (ICD-314.00) CHOLELITHIASIS (ICD-574.20) RUQ PAIN (ICD-789.01) ELEVATED BLOOD PRESSURE WITHOUT DIAGNOSIS OF HYPERTENSION (ICD-796.2) URI (ICD-465.9) G E R D (ICD-530.81) HYPERCHOLESTEROLEMIA, PURE (ICD-272.0) DEPRESSION (ICD-311) ANXIETY  (ICD-300.00) HYPOGLYCEMIA (ICD-251.2) ANXIETY (ICD-300.00) HYPERGLYCEMIA (ICD-790.6)    Past Surgical History: Last updated: 02/13/2009 Laparoscopic Choleycystectomy (Dr Lindie Spruce) 06/16/2007 Colonoscopy showing diverticula and internal hemorrhoids 02/06/2002 ( Dr. Juanda Chance) Cardiolite normal with ef 76% 02/14/2002 Echo EF WNL TR M.R.,T.R 05/2004 C-Section Left Breast Cystectomy Laminectomy EGD--09/2008--hiatal hernia, esophagitis/Barrett's on bx 02/12/2009--laprosscopic fundoplication with repair of hiatal hernia--[facility]  Family History: Last updated: 10/10/2008 Father:   bypass surg hypertension, diabetes Mother: alive Siblings: one younger sister CV + MGM died with MI, PGF died with MI HBP + father DM + father and Mat aunt Breast/ovarian/uterine cancer none Depression none ETOH/drug abuse none Family History of Colon Cancer: PGM Family History of Prostate Cancer: MGF Family History of Colon Polyps: MGM, Mother  Social History: Last updated: 10/10/2008 Marital Status: Married Children: two, 1 girl, 1 boy Occupation: Film/video editor, 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  Alcohol Use - no Daily Caffeine Use 1 cup Illicit Drug Use - no  Risk Factors: Caffeine Use: 2 (03/10/2007) Exercise: no (03/10/2007)  Risk Factors: Smoking Status: quit (10/10/2008) Passive Smoke Exposure: no (03/10/2007)  Review of Systems      See HPI General:  Denies malaise. Eyes:  Denies blurring. ENT:  Denies difficulty swallowing. CV:  Denies chest pain or discomfort. Resp:  Denies shortness of breath. Psych:  Denies anxiety and depression.  Physical Exam  General:  Well developed, well nourished, no acute distress.  Mouth:  No deformity or lesions, dentition normal. Lungs:  Clear throughout to auscultation. Heart:  Regular rate and rhythm; no murmurs, rubs,  or bruits. Psych:  Alert  and cooperative. Normal mood and affect.   Impression &  Recommendations:  Problem # 1:  DEPRESSION (ICD-311) Assessment Unchanged Stable.  continue Venflafaxine. The following medications were removed from the medication list:    Effexor 37.5 Mg Tabs (Venlafaxine hcl) .Marland Kitchen... Take 2  tablet by mouth once a day Her updated medication list for this problem includes:    Venlafaxine Hcl 37.5 Mg Tabs (Venlafaxine hcl) .Marland Kitchen... 1 tab by two times a day.  Problem # 2:  OBESITY (ICD-278.00) Assessment: Unchanged D/c Phenteramine.  Discussed exercise and possible rejoining weight watchers.  Complete Medication List: 1)  Simvastatin 40 Mg Tabs (Simvastatin) .... Take one tablet at bedtime 2)  Venlafaxine Hcl 37.5 Mg Tabs (Venlafaxine hcl) .Marland Kitchen.. 1 tab by two times a day.  Current Allergies (reviewed today): ZOLOFT (SERTRALINE HCL)

## 2010-04-28 NOTE — Assessment & Plan Note (Signed)
Summary: DISCUSS MEDICATION/CLE   Vital Signs:  Patient profile:   50 year old female Weight:      203.25 pounds Temp:     98.8 degrees F oral Pulse rate:   80 / minute Pulse rhythm:   regular BP sitting:   110 / 78  (left arm) Cuff size:   large  Vitals Entered By: Sydell Axon LPN (November 24, 2009 11:02 AM) CC: Discuss changing her Effexor to a generic   History of Present Illness: 50 yo pt  here to discuss medications.  Obesity- has really been working on weight loss.  Lost 20 pounds last year but gained it all back.  Exercising 4-5 days a week for at least 30 minutes.  We started Phenteramine 15 mg daily several months ago.  Did not loose much weight, was not exercisign than and not eating right.  Did not have paliptations, nausea, CP, SOB. Would like to try again.  Depression- stable on Effexor but would like to try generic.  Called [facility] and they do carry generic effexor.  Current Medications (verified): 1)  Effexor 37.5 Mg  Tabs (Venlafaxine Hcl) .... Take 2  Tablet By Mouth Once A Day 2)  Phentermine Hcl 15 Mg Caps (Phentermine Hcl) .Marland Kitchen.. 1 Tab By Mouth Every Morning Before Breakfast and 1 Tab Before Lunch 3)  Simvastatin 40 Mg Tabs (Simvastatin) .... Take One Tablet At Bedtime 4)  Venlafaxine Hcl 37.5 Mg Tabs (Venlafaxine Hcl) .Marland Kitchen.. 1 Tab By Mouth Daily.  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) HERNIATED DISC (ICD-722.2) FATIGUE (ICD-780.79) HEALTH SCREENING (ICD-V70.0) CHEST PAIN (ICD-786.50) ADD (ICD-314.00) CHOLELITHIASIS (ICD-574.20) RUQ PAIN (ICD-789.01) ELEVATED BLOOD PRESSURE WITHOUT DIAGNOSIS OF HYPERTENSION (ICD-796.2) URI (ICD-465.9) G  E R D (ICD-530.81) HYPERCHOLESTEROLEMIA, PURE (ICD-272.0) DEPRESSION (ICD-311) ANXIETY (ICD-300.00) HYPOGLYCEMIA (ICD-251.2) ANXIETY (ICD-300.00) HYPERGLYCEMIA (ICD-790.6)    Past Surgical History: Last updated: 02/13/2009 Laparoscopic Choleycystectomy (Dr Lindie Spruce) 06/16/2007 Colonoscopy showing diverticula and internal hemorrhoids 02/06/2002 ( Dr. Juanda Chance) Cardiolite normal with ef 76% 02/14/2002 Echo EF WNL TR M.R.,T.R 05/2004 C-Section Left Breast Cystectomy Laminectomy EGD--09/2008--hiatal hernia, esophagitis/Barrett's on bx 02/12/2009--laprosscopic fundoplication with repair of hiatal hernia--[facility]  Family History: Last updated: 10/10/2008 Father:   bypass surg hypertension, diabetes Mother: alive Siblings: one younger sister CV + MGM died with MI, PGF died with MI HBP + father DM + father and Mat aunt Breast/ovarian/uterine cancer none Depression none ETOH/drug abuse none Family History of Colon Cancer: PGM Family History of Prostate Cancer: MGF Family History of Colon Polyps: MGM, Mother  Social History: Last updated: 10/10/2008 Marital Status: Married Children: two, 1 girl, 1 boy Occupation: Film/video editor, 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  Alcohol Use - no Daily Caffeine Use 1 cup Illicit Drug Use - no  Risk Factors: Caffeine Use: 2 (03/10/2007) Exercise: no (03/10/2007)  Risk Factors: Smoking Status: quit (10/10/2008) Passive Smoke Exposure: no (03/10/2007)  Review of Systems      See HPI CV:  Denies chest pain or discomfort, lightheadness, near fainting, and palpitations. Psych:  Denies anxiety and depression.  Physical Exam  General:  Well developed, well nourished, no acute distress.  Psych:  Alert and cooperative. Normal mood and affect.   Impression & Recommendations:  Problem # 1:  OBESITY (ICD-278.00) Assessment Unchanged Time spent with patient 25 minutes, more than  50% of this time was spent counseling patient  on obesity and depression. Since BP stable and is working on lifestyle changes, will try another short course of phenteramine 15 mg dialy.  Problem # 2:  DEPRESSION (ICD-311) Assessment: Unchanged Stable, generic prescription for Effexor sent into [facility]. Her updated medication list for this problem includes:    Effexor 37.5 Mg Tabs (Venlafaxine hcl) .Marland Kitchen... Take 2  tablet by mouth once a day    Venlafaxine Hcl 37.5 Mg Tabs (Venlafaxine hcl) .Marland Kitchen... 1 tab by mouth daily.  Complete Medication List: 1)  Effexor 37.5 Mg Tabs (Venlafaxine hcl) .... Take 2  tablet by mouth once a day 2)  Phentermine Hcl 15 Mg Caps (Phentermine hcl) .Marland Kitchen.. 1 tab by mouth every morning before breakfast and 1 tab before lunch 3)  Simvastatin 40 Mg Tabs (Simvastatin) .... Take one tablet at bedtime 4)  Venlafaxine Hcl 37.5 Mg Tabs (Venlafaxine hcl) .Marland Kitchen.. 1 tab by mouth daily.  Patient Instructions: 1)  Please follow up in one month. Prescriptions: PHENTERMINE HCL 15 MG CAPS (PHENTERMINE HCL) 1 tab by mouth every morning before breakfast and 1 tab before lunch  #60 x 0   Entered and Authorized by:   Ruthe Mannan MD   Signed by:   Ruthe Mannan MD on 11/24/2009   Method used:   Print then Give to Patient   RxID:   [id number] VENLAFAXINE HCL 37.5 MG TABS (VENLAFAXINE HCL) 1 tab by mouth daily.  #21 x 0   Entered and Authorized by:   Ruthe Mannan MD   Signed by:   Ruthe Mannan MD on 11/24/2009   Method used:   [facility]        [facility]  [place]ail[facility]       [address]       [place], [place]  [address]       Ph: [phone] or [phone]       Fax: [phone]   RxID:   [id number] VENLAFAXINE HCL 37.5 MG TABS (VENLAFAXINE HCL) 1 tab by mouth daily.  #600 x 3   Entered and Authorized by:   Ruthe Mannan MD   Signed by:   Ruthe Mannan MD on 11/24/2009   Method used:   Electronically to        [facility]* (retail)             ,          Ph: [phone]       Fax: [phone]   RxID:    [id number]   Current Allergies (reviewed today): ZOLOFT (SERTRALINE HCL)

## 2010-04-28 NOTE — Assessment & Plan Note (Signed)
Summary: FLU SHOT/ARON/DLO  Nurse Visit   Allergies: 1)  Zoloft (Sertraline Hcl)  Immunizations Administered:  Influenza Vaccine # 1:    Vaccine Type: Fluvax 3+    Site: left deltoid    Mfr: [employer]oSmithKline    Dose: 0.5 ml    Route: IM    Given by: Mervin Hack CMA (AAMA)    Exp. Date: 09/26/2010    Lot #: [id number]    VIS given: 10/21/09 version given December 26, 2009.  Flu Vaccine Consent Questions:    Do you have a history of severe allergic reactions to this vaccine? no    Any prior history of allergic reactions to egg and/or gelatin? no    Do you have a sensitivity to the preservative Thimersol? no    Do you have a past history of Guillan-Barre Syndrome? no    Do you currently have an acute febrile illness? no    Have you ever had a severe reaction to latex? no    Vaccine information given and explained to patient? yes    Are you currently pregnant? no  Orders Added: 1)  Flu Vaccine 52yrs + [90658] 2)  Admin 1st Vaccine [[address]]

## 2010-04-28 NOTE — Assessment & Plan Note (Signed)
Summary: 1 MONTH FOLLOW UP/RBH   Vital Signs:  Patient profile:   50 year old female Height:      66.5 inches Weight:      202.38 pounds BMI:     32.29 Temp:     98.0 degrees F oral Pulse rate:   100 / minute Pulse rhythm:   regular BP sitting:   118 / 78  (left arm) Cuff size:   large  Vitals Entered By: Linde Gillis CMA Duncan Dull) (July 08, 2009 8:06 AM) CC: one month follow up   History of Present Illness: 50 yo pt  here for one month follow up.  HLD- has been on Vytorin, doing well.  Lipids last month well controlled- HDL 53, LDL 64, TG 127.  She received a letter from [facility] asking her to switch to generic medication.  She says that she has never been on a statin alone.  Really working on diet and exercise.  Never had myalgias with Vytorin.  Obesity- has really been working on weight loss.  Lost 20 pounds 6 months ago but gained it all back.  Exercising 4-5 days a week for at least 30 minutes.  We started Phenteramine 15 mg daily last month.  She has lost 2 pounds.  No paliptations, nausea, CP, SOB. She feels it really does help to curb her appetite.  She bring in a food journal, portions look good.  Typical meal from her food journal: breakfast:  oatmeal, lunch:  grilled chicken, small dessert, dinner: salad, potatoe, grilled chicken.  Right heel spur- was seeing an orthopedist, she cannot remember his name.  used to wear orthotics which did help for awhile.  She would like to see Dr. Lajoyce Corners as she was told he works a lot with heel spurs.  Pain has been slowly getting worse.   Current Medications (verified): 1)  Effexor 37.5 Mg  Tabs (Venlafaxine Hcl) .... Take 2  Tablet By Mouth Once A Day 2)  Phentermine Hcl 15 Mg Caps (Phentermine Hcl) .Marland Kitchen.. 1 Tab By Mouth Every Morning Before Breakfast and 1 Tab Before Lunch 3)  Simvastatin 40 Mg Tabs (Simvastatin) .... Take One Tablet At Bedtime  Allergies: 1)  Zoloft (Sertraline Hcl)  Review of Systems      See HPI General:   Complains of weight loss; denies fever and loss of appetite. CV:  Denies chest pain or discomfort. GI:  Denies abdominal pain, diarrhea, nausea, and vomiting. Neuro:  Denies headaches.  Physical Exam  General:  Well developed, well nourished, no acute distress. lost 2 pounds since last month. Mouth:  No deformity or lesions, dentition normal. Lungs:  Clear throughout to auscultation. Heart:  Regular rate and rhythm; no murmurs, rubs,  or bruits. Abdomen:  soft abdomen minimal tenderness in epigastrium normoactive bowel sounds. No distention.  Extremities:  TTP over right heel, no obvious swelling. FROM. Psych:  Alert and cooperative. Normal mood and affect.   Impression & Recommendations:  Problem # 1:  HYPERLIPIDEMIA (ICD-272.4) Assessment Unchanged Stable, but wants to try generic statin.  I agree this make sense, especially since she has never been on one without combo. Will try Simvastatin 40 mg.  Follow up in 1 month, recheck FLP and liver function at that time. The following medications were removed from the medication list:    Vytorin 10-40 Mg Tabs (Ezetimibe-simvastatin) .Marland Kitchen... Take one by mouth daily Her updated medication list for this problem includes:    Simvastatin 40 Mg Tabs (Simvastatin) .Marland Kitchen... Take one  tablet at bedtime  Problem # 2:  OBESITY (ICD-278.00) Assessment: Improved Will increase phenteramine to 15 mg two times a day.  Continue food journal.  Follow up in one month.  Problem # 3:  CALCANEAL SPUR, RIGHT (ICD-726.73) Assessment: Deteriorated Refer to ortho as per pt request. Orders: Orthopedic Referral (Ortho)  Complete Medication List: 1)  Effexor 37.5 Mg Tabs (Venlafaxine hcl) .... Take 2  tablet by mouth once a day 2)  Phentermine Hcl 15 Mg Caps (Phentermine hcl) .Marland Kitchen.. 1 tab by mouth every morning before breakfast and 1 tab before lunch 3)  Simvastatin 40 Mg Tabs (Simvastatin) .... Take one tablet at bedtime  Patient Instructions: 1)  Good to see  you. 2)  Please come see me in one month. 3)  Keep up food journal. 4)  Please stop by to see Shirlee Limerick on your way out. Prescriptions: PHENTERMINE HCL 15 MG CAPS (PHENTERMINE HCL) 1 tab by mouth every morning before breakfast and 1 tab before lunch  #60 x 0   Entered and Authorized by:   Ruthe Mannan MD   Signed by:   Ruthe Mannan MD on 07/08/2009   Method used:   Print then Give to Patient   RxID:   [id number] SIMVASTATIN 40 MG TABS (SIMVASTATIN) Take one tablet at bedtime  #90 x 3   Entered and Authorized by:   Ruthe Mannan MD   Signed by:   Ruthe Mannan MD on 07/08/2009   Method used:   Electronically to        [facility]* (mail-order)             ,          Ph: [phone]       Fax: [phone]   RxID:   [id number]   Current Allergies (reviewed today): ZOLOFT (SERTRALINE HCL)

## 2010-04-28 NOTE — Progress Notes (Signed)
Summary: refill request for soma/ denied  Phone Note Refill Request Message from:  Fax from Pharmacy  Refills Requested: Medication #1:  soma 250 mg   Last Refilled: 08/06/2008 [facility] [address], this is no longer on med list.  Initial call taken by: Lowella Petties CMA, AAMA,  January 26, 2010 9:23 AM  Follow-up for Phone Call        what does she need this for? Not typically something I prescribe without seeing the pt first. Not meant to be a chronic medication. Ruthe Mannan MD  January 26, 2010 11:15 AM   Additional Follow-up for Phone Call Additional follow up Details #1::        Advised pharmacist that pt needs to contact the office. Additional Follow-up by: Lowella Petties CMA, AAMA,  January 26, 2010 12:13 PM

## 2010-04-28 NOTE — Progress Notes (Signed)
Summary: clarification is needed on generic effexor.  Phone Note From Pharmacy   Caller: [facility]* Summary of Call: Pharmacy is asking you to clarifly generic effexor script.  Pt has a hx of getting extended release, filled in april.  Also, they need to verify how many pt is to take a day.  In april she was taking 3 of XR,  new script today was for one a day of immediate release.  Phone is [phone], reference [phone]. Initial call taken by: Lowella Petties CMA,  November 24, 2009 4:43 PM  Follow-up for Phone Call        I'm so sorry.  It should be generic effexor XR  37.5 mg- 1 tab by mouth two times a day.  Ruthe Mannan MD  November 25, 2009 7:39 AM  Spoke to [facility] and corrected the Rx for Effexor via telephone.  #180 with 2 refills authorized.  Follow-up by: Linde Gillis CMA Duncan Dull),  November 25, 2009 10:17 AM    New/Updated Medications: VENLAFAXINE HCL 37.5 MG TABS (VENLAFAXINE HCL) 1 tab by two times a day.  Appended Document: clarification is needed on generic effexor. Mimi called back and left a voicemail message wanting to clarify that they are suppose to cancel the Rx that Dr. Dayton Martes sent electronically and only honor the Rx that I called in today.  Called Mimi back but she was unavailable.  Spoke with Mal Amabile and advised him as instructed via telephone.

## 2010-04-28 NOTE — Letter (Signed)
Summary: [name] letter  [place] [facility]  [address] [place], [place] [address]   Phone: [phone]  Fax: [phone]       11/04/2009 MRN: [id number]  [name] [address] [place], [place]  [address]  Dear Ms. [name] [place], and  announce the retirement of Arta Silence, M.D., from full-time practice at the [facility] office effective September 25, 2009 and his plans of returning part-time.  It is important to Dr. Hetty Ely and to our practice that you understand that [facility] has seven physicians in our office for your health care needs.  We will continue to offer the same exceptional care that you have today.    Dr. Hetty Ely has spoken to many of you about his plans for retirement and returning part-time in the fall.   We will continue to work with you through the transition to schedule appointments for you in the office and meet the high standards that [facility] is committed to.   Again, it is with great pleasure that we share the news that Dr. Hetty Ely will return to [facility] at [facility] in October of 2011 with a reduced schedule.    If you have any questions, or would like to request an appointment with one of our physicians, please call us at [phone] and press the option for Scheduling an appointment.  We take pleasure in providing you with excellent patient care and look forward to seeing you at your next office visit.  Our [facility] Physicians are:  Tillman Abide, M.D. Laurita Quint, M.D. Roxy Manns, M.D. Kerby Nora, M.D. Hannah Beat, M.D. Ruthe Mannan, M.D. We proudly welcomed Raechel Ache, M.D. and Eustaquio Boyden, M.D. to the practice in July/August 2011.  Sincerely,  [facility]

## 2010-06-17 ENCOUNTER — Telehealth: Payer: Self-pay | Admitting: *Deleted

## 2010-06-17 NOTE — Telephone Encounter (Signed)
Pt called today to report that she pulled a very small tick off of her that had probably been there for a couple of days.  She said the tick had a spot on it, so she was concerned about RMSF.  I advised her that the really small ticks dont carry that disease. I offered pt an office visit but she said she would just watch and wait.  I told her that if she gets a rash, ring around the bite, or any feelings of illness that she should call and report that. Pt agreed.

## 2010-06-22 ENCOUNTER — Telehealth: Payer: Self-pay | Admitting: *Deleted

## 2010-06-22 NOTE — Telephone Encounter (Signed)
Opened in error

## 2010-06-22 NOTE — Telephone Encounter (Signed)
I agree with below. Please call pt to see how she is feeling.

## 2010-06-22 NOTE — Telephone Encounter (Signed)
Nikki- did Dr. Dayton Martes see this note?  It's still open in chart review.

## 2010-06-22 NOTE — Telephone Encounter (Signed)
Left message on machine at home for patient to return call. 

## 2010-06-22 NOTE — Telephone Encounter (Signed)
Dr. Aron - please advise 

## 2010-06-24 ENCOUNTER — Telehealth: Payer: Self-pay | Admitting: *Deleted

## 2010-06-24 NOTE — Telephone Encounter (Signed)
Patient advised as instructed via telephone.  Site where she found tick is not swollen, not red, and she feels ok.  Will call if she starts to experience other symptoms.

## 2010-06-24 NOTE — Telephone Encounter (Signed)
Open by mistake

## 2010-07-01 ENCOUNTER — Telehealth: Payer: Self-pay | Admitting: *Deleted

## 2010-07-01 LAB — CBC
MCV: 82.9 fL (ref 78.0–100.0)
MCV: 83 fL (ref 78.0–100.0)
Platelets: 423 10*3/uL — ABNORMAL HIGH (ref 150–400)
RBC: 4.09 MIL/uL (ref 3.87–5.11)
WBC: 12.9 10*3/uL — ABNORMAL HIGH (ref 4.0–10.5)
WBC: 19.9 10*3/uL — ABNORMAL HIGH (ref 4.0–10.5)

## 2010-07-01 LAB — DIFFERENTIAL
Basophils Relative: 0 % (ref 0–1)
Eosinophils Absolute: 0 10*3/uL (ref 0.0–0.7)
Lymphs Abs: 0.6 10*3/uL — ABNORMAL LOW (ref 0.7–4.0)
Lymphs Abs: 1.2 10*3/uL (ref 0.7–4.0)
Monocytes Relative: 9 % (ref 3–12)
Neutro Abs: 10.5 10*3/uL — ABNORMAL HIGH (ref 1.7–7.7)
Neutrophils Relative %: 81 % — ABNORMAL HIGH (ref 43–77)
Neutrophils Relative %: 92 % — ABNORMAL HIGH (ref 43–77)

## 2010-07-01 NOTE — Telephone Encounter (Signed)
Needs to be seen first to check bp.

## 2010-07-01 NOTE — Telephone Encounter (Signed)
Pt states she has not been on phentermine for a couple of months but does want to try it again.  She has started walking and thinks this will help her lose weight.  [facility].

## 2010-07-02 NOTE — Telephone Encounter (Signed)
Patient advised as instructed via telephone.  Scheduled appt for 07/09/2010 at 9:15.

## 2010-07-08 ENCOUNTER — Encounter: Payer: Self-pay | Admitting: Family Medicine

## 2010-07-08 LAB — HM COLONOSCOPY

## 2010-07-08 LAB — HM SIGMOIDOSCOPY

## 2010-07-09 ENCOUNTER — Encounter: Payer: Self-pay | Admitting: Family Medicine

## 2010-07-09 ENCOUNTER — Ambulatory Visit (INDEPENDENT_AMBULATORY_CARE_PROVIDER_SITE_OTHER): Payer: Federal, State, Local not specified - PPO | Admitting: Family Medicine

## 2010-07-09 VITALS — BP 132/80 | HR 68 | Temp 98.6°F | Ht 68.0 in | Wt 211.1 lb

## 2010-07-09 DIAGNOSIS — E669 Obesity, unspecified: Secondary | ICD-10-CM

## 2010-07-09 DIAGNOSIS — F329 Major depressive disorder, single episode, unspecified: Secondary | ICD-10-CM

## 2010-07-09 MED ORDER — PHENTERMINE HCL 15 MG PO CAPS: 15.0000 mg | ORAL_CAPSULE | ORAL | Status: AC

## 2010-07-09 NOTE — Assessment & Plan Note (Signed)
Deteriorated. >25 min spent with patient, at least half of which was spent on counseling on weight loss and weaning off venlafaxine. Will restart Phenteramine, continue with diet and exercise. Follow up in one month. See pt instructions for details.

## 2010-07-09 NOTE — Patient Instructions (Signed)
Good to see you. Effexor wean:  Alternate between 1 and 2 capsules daily for 1 week. Then take 1 capsule daily for 1 week. Then take 1 capsule every other day for two weeks and stop if you can.  Follow up in one month.

## 2010-07-09 NOTE — Assessment & Plan Note (Signed)
Improved. See pt instructions.

## 2010-07-09 NOTE — Progress Notes (Signed)
50 yo pt  here to discuss medications.  Obesity- has really been working on weight loss.  Lost 20 pounds last year but gained it all back.  Exercising more regularly now.  Much happier in life in general, so not "stress eating" as much. We started Phenteramine 15 mg daily several months ago.  Did not loose much weight, was not exercising than and not eating right.  Did not have paliptations, nausea, CP, SOB or HTN. Wt Readings from Last 3 Encounters:  07/09/10 211 lb 1.9 oz (95.763 kg)  12/29/09 204 lb (92.534 kg)  12/04/09 205 lb (92.987 kg)    Depression- feels like she is doing fine on generic Venlafaxine.  Denies any signs or symptoms of depression or anxiety.  In fact, she would like to try and wean off of it.  The PMH, PSH, Social History, Family History, Medications, and allergies have been reviewed in [facility], and have been updated if relevant.   Review of Systems       See HPI General:  Denies malaise. Eyes:  Denies blurring. ENT:  Denies difficulty swallowing. CV:  Denies chest pain or discomfort. Resp:  Denies shortness of breath. Psych:  Denies anxiety and depression.  Physical Exam  General:  Well developed, well nourished, no acute distress. BP 132/80  Pulse 68  Temp(Src) 98.6 F (37 C) (Oral)  Ht 5\' 8"  (1.727 m)  Wt 211 lb 1.9 oz (95.763 kg)  BMI 32.10 kg/m2  LMP 06/15/2010  Mouth:  No deformity or lesions, dentition normal. Lungs:  Clear throughout to auscultation. Heart:  Regular rate and rhythm; no murmurs, rubs,  or bruits. Psych:  Alert and cooperative. Normal mood and affect.

## 2010-08-10 ENCOUNTER — Other Ambulatory Visit: Payer: Self-pay | Admitting: *Deleted

## 2010-08-10 NOTE — Telephone Encounter (Signed)
Patient advised as instructed via telephone.  Appt scheduled for BP check on 08/11/2010 at 2:45.

## 2010-08-10 NOTE — Telephone Encounter (Signed)
Pt is asking for refill on phentermine 15 mg. She says she has lost 7 pounds with this.  [facility].

## 2010-08-10 NOTE — Telephone Encounter (Signed)
She needs nurse visit for BP check first.

## 2010-08-11 ENCOUNTER — Other Ambulatory Visit: Payer: Self-pay | Admitting: *Deleted

## 2010-08-11 ENCOUNTER — Ambulatory Visit (INDEPENDENT_AMBULATORY_CARE_PROVIDER_SITE_OTHER): Payer: Federal, State, Local not specified - PPO | Admitting: Family Medicine

## 2010-08-11 DIAGNOSIS — I10 Essential (primary) hypertension: Secondary | ICD-10-CM

## 2010-08-11 MED ORDER — PHENTERMINE HCL 15 MG PO CAPS: 15.0000 mg | ORAL_CAPSULE | ORAL | Status: DC

## 2010-08-11 MED ORDER — SIMVASTATIN 40 MG PO TABS: 40.0000 mg | ORAL_TABLET | Freq: Every day | ORAL | Status: DC

## 2010-08-11 NOTE — Progress Notes (Signed)
Patient came in today for a blood pressure check.  BP 120/88 Pulse 88.  Patient stated that she is doing fine.  She wants to know when to follow up with Dr. Dayton Martes.  Please advise.

## 2010-08-11 NOTE — Telephone Encounter (Signed)
Patient requested refill when she came in for her nurse visit today.  Please advise.  She does not have a follow up visit scheduled.

## 2010-08-11 NOTE — Op Note (Signed)
Whitney [name], Whitney [name]                 ACCOUNT NO.:  [id number]   MEDICAL RECORD NO.:  [id number]          PATIENT TYPE:  AMB   LOCATION:  SDS                          FACILITY:  [facility]   PHYSICIAN:  Cherylynn Ridges, M.D.    DATE OF BIRTH:  20-May-1960   DATE OF PROCEDURE:  06/16/2007  DATE OF DISCHARGE:                               OPERATIVE REPORT   PREOPERATIVE DIAGNOSIS:  Symptomatic cholelithiasis.   POSTOPERATIVE DIAGNOSIS:  Symptomatic cholelithiasis.   PROCEDURE:  Laparoscopic cholecystectomy with intraoperative  cholangiogram.   SURGEON:  Cherylynn Ridges, MD   ASSISTANT:  Lennie Muckle, MD   ANESTHESIA:  General endotracheal.   ESTIMATED BLOOD LOSS:  Less than 30 mL.   COMPLICATIONS:  None.   CONDITION:  Stable.   FINDINGS:  Gallbladder with some mild chronic acute on chronic  cholecystitis.  No stones, normal intraoperative cholangiogram.   INDICATIONS FOR OPERATION:  The patient is a 50 year old with  symptomatic gallstones who comes in now for an elective laparoscopic  cholecystectomy.   PROCEDURE IN DETAIL:  The patient was taken to the operating room and  placed on the table in supine position.  After an adequate endotracheal  anesthetic was administered she was prepped and draped in the usual  sterile manner exposing the midline and right upper quadrant.   A supraumbilical curvilinear incision was made using #11 blade and taken  down to the midline fascia.  The fascia was grabbed with two Kocher  clamps and then an incision made between the clamps using a #15 blade  down into the preperitoneal space.  We grabbed the edges of the fascia  with the Kocher clamps and then bluntly dissect into the peritoneal  cavity.  A pursestring suture around the fascial opening was made using  an 0 Vicryl suture then a Hassan cannula passed into the peritoneal  cavity.  This was secured in place with a pursestring suture.  Once this  was done carbon dioxide gas was insufflated  into the peritoneal cavity  up to a maximal intra-abdominal pressure of 15 mmHg.  Once this was done  two right costal margin 5 mm cannulas and a subxiphoid 12/11 mm cannula  were passed under direct vision.  With all in place we placed the  patient in reverse Trendelenburg, the left side was placed down and  dissection begun.   The gallbladder was retracted towards the right upper quadrant and the  anterior abdominal wall then a second one passed onto the infundibulum.  We dissected out the structures in the hepatoduodenal triangle and the  triangle of Calot, isolated the cystic duct and the cystic artery.  A  clip was placed along the gallbladder side of the cystic duct then a  cholecystodochotomy made using laparoscopic scissors.  It was through  this cholecystodochotomy that a Cook catheter which had been passed  through the anterior abdominal wall was passed.  After securing it in  place a cholangiogram was done which showed that the tip of the catheter  actually went into the common bile duct.  There was  good proximal and  distal flow.  No extravasation.  Good flow into the duodenum.  No  intraductal filling defects.   Once the cholangiogram was completely removed the clip securing it in  place was removed and the catheter then Endoclipped the cystic duct  distally x3.  We transected the cystic duct and the cystic artery after  it had been clipped proximally and distally and dissected out the  gallbladder from its bed with minimal difficulty.  We brought it out  through the supraumbilical fascial site using an EndoCatch bag.   We irrigated with just over a liter of saline solution obtaining  hemostasis in the bed using electrocautery.  Once we had adequate  hemostasis.  We aspirated all fluid and gas from above the liver and  then removed all cannulas.  The supraumbilical fascia site was closed  using a pursestring suture which was in place.  We injected quarter  percent  Marcaine with epi at all sites then we closed the skin at the  supraumbilical and subxiphoid site using running subcuticular stitch of  4-0 Vicryl.  Dermabond, Steri-Strips and Tegaderm was placed at all  sites.  All needle counts, sponge counts and instrument counts were  correct.      Cherylynn Ridges, M.D.  Electronically Signed     JOW/MEDQ  D:  06/16/2007  T:  06/16/2007  Job:  [id number]   cc:   Arta Silence, MD

## 2010-08-11 NOTE — Assessment & Plan Note (Signed)
[facility]                                 ON-CALL NOTE   NAME:Laidlaw, BRYSON PALEN                        MRN:          [id number]  DATE:12/22/2008                            DOB:          1960-06-07    Dr. Verlee Monte Brodie's patient.   Ms. Falter called to say she is having nausea, vomiting, abdominal pain,  and diarrhea.  She has had chills.  I told her that this likely is a  gastroenteritis and that she should take fluids and Tylenol.  She said  that she has a procedure with Dr. Daphine Deutscher in the morning.  I advised her  to call Dr. Ermalene Searing office to appraise him of what is going on.     Barbette Hair. Arlyce Dice, MD,FACG  Electronically Signed    RDK/MedQ  DD: 12/22/2008  DT: 12/22/2008  Job #: [id number]

## 2010-08-12 ENCOUNTER — Telehealth: Payer: Self-pay | Admitting: *Deleted

## 2010-08-12 MED ORDER — FLUTICASONE PROPIONATE 50 MCG/ACT NA SUSP: 2.0000 | Freq: Every day | NASAL | Status: DC

## 2010-08-12 NOTE — Progress Notes (Signed)
  Subjective:    Patient ID: Whitney [name], female    DOB: September 19, 1960, 50 y.o.   MRN: [id number]  HPI Pt normotensive.    Review of Systems No CP or SOB    Objective:   Physical Exam     BP 120/88  Pulse 88  Temp(Src) 97.6 F (36.4 C) (Oral)    Assessment & Plan:  Refill phenteramine. Follow up in one month.

## 2010-08-12 NOTE — Telephone Encounter (Signed)
All allergy medications other than inhaled steroids are OTC. I will send in flonase Rx. She can try allegra d or claritin d for more relief.

## 2010-08-12 NOTE — Telephone Encounter (Signed)
Rx's called to pharmacy.  Patient was notified yesterday while here for nurse visit that if she didn't hear back from me then check with her pharmacy for refills.

## 2010-08-12 NOTE — Telephone Encounter (Signed)
Patient advised via message left on cell phone voicemail.   

## 2010-08-12 NOTE — Telephone Encounter (Signed)
Pt is having a lot of problem with nasal drainage from allergies.  She has tried several otc meds without relief- including claritin, allegra and zyrtec. She is asking if something stronger can be called to [facility].

## 2010-08-14 NOTE — Cardiovascular Report (Signed)
NAMEBETUL, Whitney [name]                 ACCOUNT NO.:  [id number]   MEDICAL RECORD NO.:  [id number]          PATIENT TYPE:  OIB   LOCATION:  6501                         FACILITY:  [facility]   PHYSICIAN:  Charlies Constable, M.D. [facility] DATE OF BIRTH:  Nov 27, 1960   DATE OF PROCEDURE:  06/04/2004  DATE OF DISCHARGE:                              CARDIAC CATHETERIZATION   CLINICAL HISTORY:  Whitney Whitney [name] is 50 years old and was recently referred to  me by Dr. Hetty Ely for evaluation of chest pain and shortness of breath.  She has a history of an elevated cholesterol and borderline diabetes.  She  has a positive family history with her father, Whitney Whitney [name], who is a patient  of mine, who had bypass surgery, and there is a strong family history of  heart disease on his side of the family.  Both of her maternal grandparents  had heart disease as well.  She had had a previous Cardiolite scan two to  three years ago which was negative and with her recurrent symptoms she and  we decided definitive evaluation with coronary angiography was indicated.   PROCEDURE:  The procedure was performed via the right femoral artery using  an arterial sheath and 4 French preformed coronary catheters.  A femoral  arterial punch was performed, and Omnipaque contrast was used.  The patient  tolerated the procedure well and left the laboratory in satisfactory  condition.   RESULTS:  The aortic pressure was 119/80, with a mean of 98.  Left  ventricular pressure was 119/3.   The left main coronary artery.  The left main coronary artery was free of  significant disease.   The left anterior descending artery.  The left anterior descending artery  gave rise to two diagonal branches and three moderate-size septal  perforators.  The LAD and its branches were free of significant disease.   The circumflex artery.  The circumflex is a large codominant vessel and gave  rise to a ramus branches, two marginal branches, and three  posterolateral  branches.  These vessels were free of significant disease.   The right coronary artery.  The right coronary artery was a moderate-size  vessel and gave rise to a posterior descending branch and just a minimal  posterolateral branch.  It also gave rise to a conus branch and a right  ventricular branch.  These vessels were free of significant disease.   The left ventriculogram.  The left ventriculogram was performed in the RAO  projection and showed good wall motion with no areas of hypokinesis.   CONCLUSION:  Normal coronary angiography and left ventricular wall motion.   RECOMMENDATIONS:  The patient's coronaries look quite clean, without any  evidence of visible plaque.  In view of these findings, I think it is very  unlikely her symptoms are cardiac.  I will plan to have her follow up with  Dr. Hetty Ely, and he can decide about further GI evaluation.  She asked  about this.  She just started on Prilosec last night, and will ask her to  continue that until she sees  Dr. Hetty Ely.  Will also get an echocardiogram  to rule out other  remote cardiac possibilities for the cause of her chest pain.  She has a  strong family history, and if her LDL is quite high, then her risk factor  profile might be high enough to justify statin treatment.  She has no plaque  in her coronary arteries, so she does not have criteria for secondary  prevention.      BB/MEDQ  D:  06/04/2004  T:  06/04/2004  Job:  [id number]   cc:   Laurita Quint, M.D.  [address] [place]  [place] [address]  Fax: [phone]   Cardiopulmonary Lab

## 2010-09-04 ENCOUNTER — Other Ambulatory Visit: Payer: Self-pay | Admitting: *Deleted

## 2010-09-04 MED ORDER — VENLAFAXINE HCL 37.5 MG PO TABS: 37.5000 mg | ORAL_TABLET | Freq: Two times a day (BID) | ORAL | Status: DC

## 2010-09-09 ENCOUNTER — Other Ambulatory Visit: Payer: Self-pay | Admitting: *Deleted

## 2010-09-09 MED ORDER — PHENTERMINE HCL 15 MG PO CAPS: 15.0000 mg | ORAL_CAPSULE | ORAL | Status: DC

## 2010-09-09 NOTE — Telephone Encounter (Signed)
[facility] 

## 2010-09-18 ENCOUNTER — Other Ambulatory Visit: Payer: Self-pay | Admitting: Family Medicine

## 2010-09-18 DIAGNOSIS — Z1231 Encounter for screening mammogram for malignant neoplasm of breast: Secondary | ICD-10-CM

## 2010-09-24 ENCOUNTER — Ambulatory Visit: Payer: Federal, State, Local not specified - PPO | Admitting: Family Medicine

## 2010-09-28 ENCOUNTER — Encounter: Payer: Self-pay | Admitting: Family Medicine

## 2010-09-28 ENCOUNTER — Ambulatory Visit (INDEPENDENT_AMBULATORY_CARE_PROVIDER_SITE_OTHER): Payer: Federal, State, Local not specified - PPO | Admitting: Family Medicine

## 2010-09-28 VITALS — BP 122/80 | HR 74 | Temp 98.3°F | Ht 68.0 in | Wt 207.0 lb

## 2010-09-28 DIAGNOSIS — IMO0001 Reserved for inherently not codable concepts without codable children: Secondary | ICD-10-CM

## 2010-09-28 DIAGNOSIS — E669 Obesity, unspecified: Secondary | ICD-10-CM

## 2010-09-28 DIAGNOSIS — W57XXXA Bitten or stung by nonvenomous insect and other nonvenomous arthropods, initial encounter: Secondary | ICD-10-CM

## 2010-09-28 MED ORDER — TRIAMCINOLONE ACETONIDE 0.1 % EX CREA: 1.0000 "application " | TOPICAL_CREAM | Freq: Two times a day (BID) | CUTANEOUS | Status: DC

## 2010-09-28 MED ORDER — PHENTERMINE HCL 30 MG PO CAPS: 15.0000 mg | ORAL_CAPSULE | ORAL | Status: AC

## 2010-09-28 NOTE — Progress Notes (Signed)
  Subjective:    Patient ID: Whitney [name], female    DOB: Feb 12, 1961, 50 y.o.   MRN: [id number]  HPI  50 yo here for:  1. Tick bite- few days ago, noticed tick on left upper arm, only could have been there for less than a day. Removed it all. Next day, noticed a small red area around it. No fever, chills, rashes, nausea, vomiting, headaches or sensitivity to light.  2.  Weight loss- Walking daily now. Using phenteramine for past 2 months. Wt Readings from Last 3 Encounters:  09/28/10 207 lb (93.895 kg)  07/09/10 211 lb 1.9 oz (95.763 kg)  12/29/09 204 lb (92.534 kg)   No CP, SOB or palpitations. BP Readings from Last 3 Encounters:  09/28/10 122/80  08/11/10 120/88  07/09/10 132/80     Review of Systems See HPI    Objective:   Physical Exam BP 122/80  Pulse 74  Temp(Src) 98.3 F (36.8 C) (Oral)  Ht 5\' 8"  (1.727 m)  Wt 207 lb (93.895 kg)  BMI 31.47 kg/m2 NWG:NFAOZ, pleasant, NAD HEENT:  MMM CVS:  RRR Resp:  CTA bilaterally Skin:  Small, 1 cm, raised erythematous area upper left arm. Ext:  No edema       Assessment & Plan:   1. Tick bite   New.  Reassurance provided.  Local inflammatory response around area where tick bit her, not consistent with tick borne illness. Red flag symptoms given regarding immediate follow up. The patient indicates understanding of these issues and agrees with the plan.   2. OBESITY  Improved. Will refill phenteramine, last time. The patient indicates understanding of these issues and agrees with the plan.

## 2010-10-04 ENCOUNTER — Other Ambulatory Visit: Payer: Self-pay | Admitting: Family Medicine

## 2010-10-09 ENCOUNTER — Encounter: Payer: Self-pay | Admitting: Internal Medicine

## 2010-10-19 ENCOUNTER — Ambulatory Visit
Admission: RE | Admit: 2010-10-19 | Discharge: 2010-10-19 | Disposition: A | Discharge status: Home / Self Care | Payer: Federal, State, Local not specified - PPO | Source: Ambulatory Visit | Attending: Family Medicine | Admitting: Family Medicine

## 2010-10-19 DIAGNOSIS — Z1231 Encounter for screening mammogram for malignant neoplasm of breast: Secondary | ICD-10-CM

## 2010-10-20 ENCOUNTER — Encounter: Payer: Self-pay | Admitting: *Deleted

## 2010-10-21 ENCOUNTER — Telehealth: Payer: Self-pay | Admitting: *Deleted

## 2010-10-21 NOTE — Telephone Encounter (Signed)
Pt has a new grandbaby coming into the family and it has been recommended that the family members get tdap.  OK to schedule?

## 2010-10-21 NOTE — Telephone Encounter (Signed)
Yes ok to schedule.

## 2010-10-22 NOTE — Telephone Encounter (Signed)
Patient advised as instructed via telephone.  She will call on Friday to be put on nurse visit schedule.

## 2010-10-23 ENCOUNTER — Ambulatory Visit (INDEPENDENT_AMBULATORY_CARE_PROVIDER_SITE_OTHER): Payer: Federal, State, Local not specified - PPO | Admitting: Family Medicine

## 2010-10-23 DIAGNOSIS — Z23 Encounter for immunization: Secondary | ICD-10-CM

## 2010-10-23 NOTE — Progress Notes (Signed)
Tdap given during nurse visit today. 

## 2010-10-27 ENCOUNTER — Ambulatory Visit: Payer: Federal, State, Local not specified - PPO

## 2010-12-01 ENCOUNTER — Telehealth: Payer: Self-pay | Admitting: *Deleted

## 2010-12-01 NOTE — Telephone Encounter (Signed)
I cannot treat that without an office visit- Whitney [name] can decide whether to wait on Dr Dayton Martes or f/u with first available thanks

## 2010-12-01 NOTE — Telephone Encounter (Signed)
Patient advised as instructed via telephone, she stated that she will wait and schedule an appt with Dr. Dayton Martes when she returns.

## 2010-12-01 NOTE — Telephone Encounter (Signed)
Patient came by the office and left message for Dr. Dayton Martes. Patient is having hot flashes. Patient started experiencing these about 1 1/2 years ago briefly during the night. Patient is now having them all during the day and gets worse at night and they wake her up 3-4 times during her sleep. [facility]  Patient is aware that Dr. Dayton Martes is out this week and requested that this go to another doctor.

## 2010-12-10 ENCOUNTER — Other Ambulatory Visit: Payer: Self-pay | Admitting: Family Medicine

## 2010-12-21 LAB — CBC
Hemoglobin: 13
RBC: 4.62
WBC: 9.3

## 2010-12-21 LAB — COMPREHENSIVE METABOLIC PANEL
ALT: 21
Alkaline Phosphatase: 61
CO2: 27
GFR calc non Af Amer: >60
Glucose, Bld: 135 — ABNORMAL HIGH
Potassium: 3.9
Sodium: 141

## 2010-12-21 LAB — DIFFERENTIAL
Basophils Relative: 1
Eosinophils Absolute: 0.1
Eosinophils Relative: 1
Monocytes Relative: 6
Neutrophils Relative %: 72

## 2011-02-12 ENCOUNTER — Other Ambulatory Visit: Payer: Self-pay | Admitting: Family Medicine

## 2011-03-09 ENCOUNTER — Encounter: Payer: Self-pay | Admitting: Family Medicine

## 2011-03-09 ENCOUNTER — Ambulatory Visit (INDEPENDENT_AMBULATORY_CARE_PROVIDER_SITE_OTHER): Payer: Federal, State, Local not specified - PPO | Admitting: Family Medicine

## 2011-03-09 VITALS — BP 118/80 | HR 88 | Temp 98.7°F | Wt 211.8 lb

## 2011-03-09 DIAGNOSIS — J40 Bronchitis, not specified as acute or chronic: Secondary | ICD-10-CM

## 2011-03-09 MED ORDER — GUAIFENESIN-CODEINE 100-10 MG/5ML PO SYRP: 5.0000 mL | ORAL_SOLUTION | Freq: Two times a day (BID) | ORAL | Status: AC | PRN

## 2011-03-09 MED ORDER — AZITHROMYCIN 250 MG PO TABS: ORAL_TABLET | ORAL | Status: DC

## 2011-03-09 MED ORDER — AZITHROMYCIN 250 MG PO TABS: ORAL_TABLET | ORAL | Status: AC

## 2011-03-09 NOTE — Progress Notes (Signed)
  Subjective:    Patient ID: Whitney [name], female    DOB: 08-Dec-1960, 50 y.o.   MRN: [id number]  HPI CC: cough  5d h/o cough, chest congestion.  Started with fatigue, achey.  Last night with some sinus headache.  Cough dry.  Slight ST.  Cough keeping her up at night.  Tried mucinex for the last week.  Also tried aleve.  No fevers/chills,abd pain, joint pains, nausea, ear pain, tooth pain.   No sick contacts at home.  No smokers at home.  No h/o asthma/COPD.  + seasonal allergies.  Review of Systems Per HPI    Objective:   Physical Exam  Nursing note and vitals reviewed. Constitutional: She appears well-developed and well-nourished. No distress.  HENT:  Head: Normocephalic and atraumatic.  Right Ear: Hearing, tympanic membrane, external ear and ear canal normal.  Left Ear: Hearing, tympanic membrane, external ear and ear canal normal.  Nose: No mucosal edema or rhinorrhea. Right sinus exhibits no maxillary sinus tenderness and no frontal sinus tenderness. Left sinus exhibits no maxillary sinus tenderness and no frontal sinus tenderness.  Mouth/Throat: Uvula is midline, oropharynx is clear and moist and mucous membranes are normal. No oropharyngeal exudate, posterior oropharyngeal edema, posterior oropharyngeal erythema or tonsillar abscesses.  Eyes: Conjunctivae and EOM are normal. Pupils are equal, round, and reactive to light. No scleral icterus.  Neck: Normal range of motion. Neck supple.  Cardiovascular: Normal rate, regular rhythm, normal heart sounds and intact distal pulses.   No murmur heard. Pulses:      Radial pulses are 2+ on the right side, and 2+ on the left side.  Pulmonary/Chest: Effort normal. No respiratory distress. She has no wheezes. She has rhonchi in the right middle field and the right lower field. She has no rales.  Lymphadenopathy:    She has no cervical adenopathy.  Psychiatric: She has a normal mood and affect. Her behavior is normal. Judgment and thought  content normal.       Assessment & Plan:

## 2011-03-09 NOTE — Patient Instructions (Signed)
Sounds like bronchitis.  These usually are viral illness. Take cheratussin for cough at night. Can try mucinex to break up mucous with plenty of fluid during the day. If not improving as expected, or fever >101 or worsening cough, fill zpack. Update [place] if not improving as expected.

## 2011-03-09 NOTE — Assessment & Plan Note (Signed)
Anticipate viral, however given lung findings (L sided rhonchi), provided with [facility] script for zpack. Update [place] if sxs not improving as expected.

## 2011-03-16 ENCOUNTER — Other Ambulatory Visit: Payer: Self-pay | Admitting: *Deleted

## 2011-03-16 MED ORDER — AZITHROMYCIN 250 MG PO TABS: ORAL_TABLET | ORAL | Status: DC

## 2011-03-16 NOTE — Telephone Encounter (Signed)
Left message on cell phone voicemail that Rx for Zpak has been sent to [facility].

## 2011-03-16 NOTE — Telephone Encounter (Signed)
Patient wanted to know if another Rx for Zpak could be called to [facility].  She stated that often times one round doesn't do the trick but a second Rx usually knocks out what she has.  Please advise.

## 2011-06-24 ENCOUNTER — Encounter (HOSPITAL_COMMUNITY): Payer: Self-pay | Admitting: Emergency Medicine

## 2011-06-24 ENCOUNTER — Emergency Department (INDEPENDENT_AMBULATORY_CARE_PROVIDER_SITE_OTHER)
Admission: EM | Admit: 2011-06-24 | Discharge: 2011-06-24 | Disposition: A | Discharge status: Home / Self Care | Payer: Federal, State, Local not specified - PPO | Source: Home / Self Care

## 2011-06-24 ENCOUNTER — Telehealth: Payer: Self-pay | Admitting: Family Medicine

## 2011-06-24 DIAGNOSIS — J019 Acute sinusitis, unspecified: Secondary | ICD-10-CM

## 2011-06-24 MED ORDER — AZITHROMYCIN 250 MG PO TABS: ORAL_TABLET | ORAL | Status: AC

## 2011-06-24 NOTE — ED Provider Notes (Signed)
History     CSN: [id number]  Arrival date & time 06/24/11  1207   None     Chief Complaint  Patient presents with  . URI    (Consider location/radiation/quality/duration/timing/severity/associated sxs/prior treatment) HPI Comments: Patient presents today with complaints of a sinus infection for one week. She reports that she has yellow-green nasal drainage, nasal stuffiness, and sinus pressure. She also states that she has a mild nonproductive cough. No fever. States symptoms are same as previous sinus infections that she's had. Z-Pak works well for her for sinus infections, and she is requesting the same.    Past Medical History  Diagnosis Date  . Personal history of other diseases of digestive system   . Other and unspecified hyperlipidemia   . Internal hemorrhoids without mention of complication   . Diverticulosis of colon (without mention of hemorrhage)   . Unspecified constipation   . Dysphagia, unspecified   . Congenital anomalies of foot, not elsewhere classified   . Displacement of intervertebral disc, site unspecified, without myelopathy   . Other malaise and fatigue   . Chest pain, unspecified   . Attention deficit disorder without mention of hyperactivity   . Calculus of gallbladder without mention of cholecystitis or obstruction   . Abdominal pain, right upper quadrant   . Elevated blood pressure reading without diagnosis of hypertension   . Esophageal reflux   . Depressive disorder, not elsewhere classified   . Anxiety state, unspecified   . Hypoglycemia, unspecified   . Other abnormal blood chemistry   . High cholesterol     Past Surgical History  Procedure Date  . Cholecystectomy 06/16/2007    Dr. Lindie Spruce  . Colonoscopy 02/06/2002    diverticula and internal hemorroids  . Cesarean section   . Cystectomy     left breast  . Laminectomy   . Hernia repair     Family History  Problem Relation Age of Onset  . Colon polyps Mother   . Hypertension Father    . Diabetes Father   . Colon polyps Maternal Grandmother   . Cancer Maternal Grandfather     prostate  . Cancer Paternal Grandmother     colon    History  Substance Use Topics  . Smoking status: Former Games developer  . Smokeless tobacco: Never Used  . Alcohol Use: Yes    OB History    Grav Para Term Preterm Abortions TAB SAB Ect Mult Living                  Review of Systems  Constitutional: Negative for fever, chills and fatigue.  HENT: Positive for congestion, rhinorrhea, postnasal drip and sinus pressure. Negative for ear pain, sore throat and sneezing.   Respiratory: Positive for cough. Negative for shortness of breath and wheezing.   Cardiovascular: Negative for chest pain.    Allergies  Sertraline hcl  Home Medications   Current Outpatient Rx  Name Route Sig Dispense Refill  . AZITHROMYCIN 250 MG PO TABS  take 2 tabs today, then 1 tab daily x 4 days 6 each 0  . FLUTICASONE PROPIONATE 50 MCG/ACT NA SUSP Nasal 2 sprays by Nasal route daily. 16 g 12  . SIMVASTATIN 40 MG PO TABS  TAKE 1 TABLET (40 MG TOTAL) BY MOUTH AT BEDTIME. 30 tablet 11  . TRIAMCINOLONE ACETONIDE 0.1 % EX CREA Topical Apply 1 application topically 2 (two) times daily. 30 g 2  . VENLAFAXINE HCL 37.5 MG PO TABS Oral Take 1 tablet (  37.5 mg total) by mouth 2 (two) times daily. 180 tablet 3    BP 127/77  Pulse 90  Temp(Src) 99.3 F (37.4 C) (Oral)  Resp 20  SpO2 97%  LMP 06/17/2011  Physical Exam  Nursing note and vitals reviewed. Constitutional: She appears well-developed and well-nourished. No distress.  HENT:  Head: Normocephalic and atraumatic.  Right Ear: Tympanic membrane, external ear and ear canal normal.  Left Ear: Tympanic membrane, external ear and ear canal normal.  Nose: Right sinus exhibits maxillary sinus tenderness. Left sinus exhibits maxillary sinus tenderness.  Mouth/Throat: Uvula is midline, oropharynx is clear and moist and mucous membranes are normal. No oropharyngeal  exudate, posterior oropharyngeal edema or posterior oropharyngeal erythema.  Neck: Neck supple.  Cardiovascular: Normal rate, regular rhythm and normal heart sounds.   Pulmonary/Chest: Effort normal and breath sounds normal. No respiratory distress.  Lymphadenopathy:    She has no cervical adenopathy.  Neurological: She is alert.  Skin: Skin is warm and dry.  Psychiatric: She has a normal mood and affect.    ED Course  Procedures (including critical care time)  Labs Reviewed - No data to display No results found.   1. Acute sinusitis       MDM  Sinusitis symptoms x 1 week. Pt requests rx of ZPak, states this works well for her.         Melody Comas, [place] 06/24/11 1408

## 2011-06-24 NOTE — Telephone Encounter (Signed)
Triage Record Num: [id number] Operator: Durward Mallard DiMatteis Patient Name: Whitney [name] Call Date & Time: 06/24/2011 10:23:37AM Patient Phone: [phone] PCP: Ruthe Mannan Patient Gender: Female PCP Fax : [phone] Patient DOB: 1961-03-29 Practice Name: [facility] Reason for Call: Caller: [name]/Patient; PCP: Ruthe Mannan Nestor Ramp); CB#: [phone]; Call regarding Cough/Congestion; sx started 06/16/11; having alot of head congestion and pressure; coughing up green mucus; no fever; taking Mucinex and Tylenol; helps a little; Triaged per URI Guideline; See in 24 hr d/t productive cough with colored sputum; homecare given andwill [facility] d/t no appt for today and closed tomorrow Protocol(s) Used: Upper Respiratory Infection (URI) Recommended Outcome per Protocol: See Provider within 24 hours Reason for Outcome: Productive cough with colored sputum (other than clear or white sputum) Care Advice: ~ Use a cool mist humidifier to moisten air. Be sure to clean according to manufacturer's instructions. Increase fluids to 8-12 eight oz (1.6 to 2.4 liters) glasses per day, half of them to be water. Soups, popsicles, fruit juices, non-caffeinated sodas (unless restricting sodium intake), jello, broths, decaf teas, etc. are all okay. Warm fluids can be soothing. ~ ~ Warm fluids may help, or try a mixture of honey and lemon juice in warm tea. Call provider if has a fever over 101.5 F (38.6 C) that has not responded to home care measures, having shaking chills or any fever in someone immunocompromised/frail elderly. ~ 06/24/2011 10:38:27AM Page 1 of 1 [id number]

## 2011-06-24 NOTE — Discharge Instructions (Signed)
Continue Mucinex, and an over-the-counter pain reliever as needed. Increase fluids and rest. Return or follow up with your primary care physician if symptoms persist in one week, sooner if change or worsen.  Sinusitis Sinusitis an infection of the air pockets (sinuses) in your face. This can cause puffiness (swelling). It can also cause drainage from your sinuses.  HOME CARE   Only take medicine as told by your doctor.   Drink enough fluids to keep your pee (urine) clear or pale yellow.   Apply moist heat or ice packs for pain relief.   Use salt (saline) nose sprays. The spray will wet the thick fluid in the nose. This can help the sinuses drain.  GET HELP RIGHT AWAY IF:   You have a fever.   Your baby is older than 3 months with a rectal temperature of 102 F (38.9 C) or higher.   Your baby is 71 months old or younger with a rectal temperature of 100.4 F (38 C) or higher.   The pain gets worse.   You get a very bad headache.   You keep throwing up (vomiting).   Your face gets puffy.  MAKE SURE YOU:   Understand these instructions.   Will watch your condition.   Will get help right away if you are not doing well or get worse.  Document Released: 09/01/2007 Document Revised: 03/04/2011 Document Reviewed: 09/01/2007 [facility] Patient Information 2012 [place], [place].

## 2011-06-24 NOTE — ED Notes (Signed)
Onset one week ago of head congestion, ears are stuffy, sore throat, "alot of head congestion".  Reports green and yellow nasal congestion and coughing up yellow phlegm, and cough.

## 2011-06-28 NOTE — ED Provider Notes (Signed)
Medical screening examination/treatment/procedure(s) were performed by non-physician practitioner and as supervising physician I was immediately available for consultation/collaboration.  Kenroy Timberman M. MD   Khizar Fiorella M Mischell Branford, MD 06/28/11 2146 

## 2011-07-30 ENCOUNTER — Encounter: Payer: Self-pay | Admitting: Family Medicine

## 2011-07-30 ENCOUNTER — Ambulatory Visit (INDEPENDENT_AMBULATORY_CARE_PROVIDER_SITE_OTHER): Payer: Federal, State, Local not specified - PPO | Admitting: Family Medicine

## 2011-07-30 ENCOUNTER — Other Ambulatory Visit: Payer: Self-pay | Admitting: Family Medicine

## 2011-07-30 ENCOUNTER — Ambulatory Visit (INDEPENDENT_AMBULATORY_CARE_PROVIDER_SITE_OTHER)
Admission: RE | Admit: 2011-07-30 | Discharge: 2011-07-30 | Disposition: A | Discharge status: Home / Self Care | Payer: Federal, State, Local not specified - PPO | Source: Ambulatory Visit | Attending: Family Medicine | Admitting: Family Medicine

## 2011-07-30 VITALS — BP 130/90 | HR 84 | Temp 98.8°F | Wt 203.0 lb

## 2011-07-30 DIAGNOSIS — R1011 Right upper quadrant pain: Secondary | ICD-10-CM

## 2011-07-30 DIAGNOSIS — R109 Unspecified abdominal pain: Secondary | ICD-10-CM

## 2011-07-30 LAB — POCT URINALYSIS DIPSTICK
Protein, UA: NEGATIVE
Spec Grav, UA: >=1.03
Urobilinogen, UA: NEGATIVE
pH, UA: 6

## 2011-07-30 MED ORDER — CYCLOBENZAPRINE HCL 5 MG PO TABS: 5.0000 mg | ORAL_TABLET | Freq: Three times a day (TID) | ORAL | Status: DC | PRN

## 2011-07-30 MED ORDER — IOHEXOL 300 MG/ML  SOLN
80.0000 mL | Freq: Once | INTRAMUSCULAR | Status: AC | PRN
  Administered 2011-07-30: 80 mL via INTRAVENOUS

## 2011-07-30 NOTE — Progress Notes (Signed)
Subjective:    Patient ID: Whitney [name], female    DOB: 1960-11-20, 51 y.o.   MRN: [id number]  HPI  51 yo here for right upper back pain that "shoots straight through" to RUQ of abdomen, getting worse past few days. No known injury. No CP or SOB. No rashes. No n/v/d.  Not sure if it is worsened by food. Remote h/o cholecystectomy. Does not feel like a muscle spasm- "near ribs not spine."  Patient Active Problem List  Diagnoses  . HYPOGLYCEMIA  . HYPERCHOLESTEROLEMIA, PURE  . HYPERLIPIDEMIA  . OBESITY  . OTHER SPECIFIED ANEMIAS  . ANXIETY  . DEPRESSION  . ADD  . HEMORRHOIDS, INTERNAL  . G E R D  . BARRETTS ESOPHAGUS  . HIATAL HERNIA  . DIVERTICULAR DISEASE  . CONSTIPATION  . CHOLELITHIASIS  . CELLULITIS, LEGS  . HERNIATED DISC  . CALCANEAL SPUR, RIGHT  . CONGENITAL ANOMALIES OF FOOT NEC  . FATIGUE  . CHEST PAIN  . DYSPHAGIA UNSPECIFIED  . RUQ PAIN  . HYPERGLYCEMIA  . ELEVATED BLOOD PRESSURE WITHOUT DIAGNOSIS OF HYPERTENSION  . ESOPHAGITIS, HX OF  . Tick bite  . Bronchitis  . Right upper quadrant pain   Past Medical History  Diagnosis Date  . Personal history of other diseases of digestive system   . Other and unspecified hyperlipidemia   . Internal hemorrhoids without mention of complication   . Diverticulosis of colon (without mention of hemorrhage)   . Unspecified constipation   . Dysphagia, unspecified   . Congenital anomalies of foot, not elsewhere classified   . Displacement of intervertebral disc, site unspecified, without myelopathy   . Other malaise and fatigue   . Chest pain, unspecified   . Attention deficit disorder without mention of hyperactivity   . Calculus of gallbladder without mention of cholecystitis or obstruction   . Abdominal pain, right upper quadrant   . Elevated blood pressure reading without diagnosis of hypertension   . Esophageal reflux   . Depressive disorder, not elsewhere classified   . Anxiety state, unspecified   .  Hypoglycemia, unspecified   . Other abnormal blood chemistry   . High cholesterol    Past Surgical History  Procedure Date  . Cholecystectomy 06/16/2007    Dr. Lindie Spruce  . Colonoscopy 02/06/2002    diverticula and internal hemorroids  . Cesarean section   . Cystectomy     left breast  . Laminectomy   . Hernia repair    History  Substance Use Topics  . Smoking status: Former Games developer  . Smokeless tobacco: Never Used  . Alcohol Use: Yes   Family History  Problem Relation Age of Onset  . Colon polyps Mother   . Hypertension Father   . Diabetes Father   . Colon polyps Maternal Grandmother   . Cancer Maternal Grandfather     prostate  . Cancer Paternal Grandmother     colon   Allergies  Allergen Reactions  . Sertraline Hcl     REACTION: Headache/ vision changes   Current Outpatient Prescriptions on File Prior to Visit  Medication Sig Dispense Refill  . fluticasone (FLONASE) 50 MCG/ACT nasal spray 2 sprays by Nasal route daily.  16 g  12  . simvastatin (ZOCOR) 40 MG tablet TAKE 1 TABLET (40 MG TOTAL) BY MOUTH AT BEDTIME.  30 tablet  11  . triamcinolone (KENALOG) 0.1 % cream Apply 1 application topically 2 (two) times daily.  30 g  2  . venlafaxine (EFFEXOR) 37.5  MG tablet Take 1 tablet (37.5 mg total) by mouth 2 (two) times daily.  180 tablet  3   The PMH, PSH, Social History, Family History, Medications, and allergies have been reviewed in [facility], and have been updated if relevant.    Review of Systems  See HPI      Objective:   Physical Exam BP 130/90  Pulse 84  Temp(Src) 98.8 F (37.1 C) (Oral)  Wt 203 lb (92.08 kg)  General:  Well-developed,well-nourished,in no acute distress; alert,appropriate and cooperative throughout examination Head:  normocephalic and atraumatic.   Eyes:  vision grossly intact, pupils equal, pupils round, and pupils reactive to light.   Ears:  R ear normal and L ear normal.   Nose:  no external deformity.   Mouth:  good dentition.     Lungs:  Normal respiratory effort, chest expands symmetrically. Lungs are clear to auscultation, no crackles or wheezes. Heart:  Normal rate and regular rhythm. S1 and S2 normal without gallop, murmur, click, rub or other extra sounds. Abdomen:  Bowel sounds positive,abdomen soft, TTP over RUQ without masses, organomegaly or hernias noted. Msk:  No deformity or scoliosis noted of thoracic or lumbar spine.   Extremities:  No clubbing, cyanosis, edema, or deformity noted with normal full range of motion of all joints.   Neurologic:  alert & oriented X3 and gait normal.   Skin:  Intact without suspicious lesions or rasheslert and cooperative with normal attention span and concentration. No apparent delusions, illusions, hallucinations      Assessment & Plan:   1. Right upper quadrant pain  CT Abdomen Wo Contrast, Comprehensive metabolic panel, Lipase   New- very wide differential. Pain is quite significant and getting worse- will order CT abdomen for further evaluation- ?biliary duct/hepatic issue. Could also be a MSK issue- no known injury. Will also CMET and lipase. Advised to go straight to ER if symptoms worsen over the weekend.

## 2011-07-30 NOTE — Patient Instructions (Signed)
Good to see you. Please stop by to see Whitney [name] on your way out.

## 2011-07-31 LAB — COMPREHENSIVE METABOLIC PANEL
Alkaline Phosphatase: 71 U/L (ref 39–117)
Creat: 0.79 mg/dL (ref 0.50–1.10)
Glucose, Bld: 105 mg/dL — ABNORMAL HIGH (ref 70–99)
Sodium: 139 mEq/L (ref 135–145)
Total Bilirubin: 0.3 mg/dL (ref 0.3–1.2)
Total Protein: 6.7 g/dL (ref 6.0–8.3)

## 2011-07-31 LAB — LIPASE: Lipase: 28 U/L (ref 0–75)

## 2011-08-03 ENCOUNTER — Telehealth: Payer: Self-pay | Admitting: *Deleted

## 2011-08-03 MED ORDER — CYCLOBENZAPRINE HCL 5 MG PO TABS: 5.0000 mg | ORAL_TABLET | Freq: Three times a day (TID) | ORAL | Status: AC | PRN

## 2011-08-03 NOTE — Telephone Encounter (Signed)
Advised pt of lab results.  She says she is still having some spasms and asks if she can have some flexeril sent to [facility]. She states overall she is bettter, but is still having the spasms.  [facility] to see if anything has been called in.

## 2011-08-03 NOTE — Telephone Encounter (Signed)
Rx sent 

## 2011-10-15 ENCOUNTER — Encounter: Payer: Self-pay | Admitting: Gastroenterology

## 2011-12-15 ENCOUNTER — Other Ambulatory Visit: Payer: Self-pay | Admitting: Family Medicine

## 2011-12-16 ENCOUNTER — Encounter: Payer: Self-pay | Admitting: Internal Medicine

## 2012-01-28 ENCOUNTER — Other Ambulatory Visit: Payer: Self-pay | Admitting: Family Medicine

## 2012-01-28 DIAGNOSIS — Z1231 Encounter for screening mammogram for malignant neoplasm of breast: Secondary | ICD-10-CM

## 2012-02-21 ENCOUNTER — Ambulatory Visit
Admission: RE | Admit: 2012-02-21 | Discharge: 2012-02-21 | Disposition: A | Discharge status: Home / Self Care | Payer: Federal, State, Local not specified - PPO | Source: Ambulatory Visit | Attending: Family Medicine | Admitting: Family Medicine

## 2012-02-21 DIAGNOSIS — Z1231 Encounter for screening mammogram for malignant neoplasm of breast: Secondary | ICD-10-CM

## 2012-08-08 ENCOUNTER — Telehealth: Payer: Self-pay

## 2012-08-08 NOTE — Telephone Encounter (Signed)
Pt has 2 more days of Effexor and pt has not scheduled CPX yet; pt scheduled med refill appt 08/10/12 at 8 am.

## 2012-08-10 ENCOUNTER — Encounter: Payer: Self-pay | Admitting: Family Medicine

## 2012-08-10 ENCOUNTER — Ambulatory Visit (INDEPENDENT_AMBULATORY_CARE_PROVIDER_SITE_OTHER): Payer: Federal, State, Local not specified - PPO | Admitting: Family Medicine

## 2012-08-10 VITALS — BP 120/88 | HR 68 | Temp 97.9°F | Wt 201.0 lb

## 2012-08-10 DIAGNOSIS — E785 Hyperlipidemia, unspecified: Secondary | ICD-10-CM

## 2012-08-10 DIAGNOSIS — F329 Major depressive disorder, single episode, unspecified: Secondary | ICD-10-CM

## 2012-08-10 LAB — COMPREHENSIVE METABOLIC PANEL
ALT: 29 U/L (ref 0–35)
CO2: 27 mEq/L (ref 19–32)
Calcium: 8.6 mg/dL (ref 8.4–10.5)
Chloride: 106 mEq/L (ref 96–112)
GFR: 84.47 mL/min (ref >60.00)
Sodium: 140 mEq/L (ref 135–145)
Total Bilirubin: 0.5 mg/dL (ref 0.3–1.2)
Total Protein: 6.2 g/dL (ref 6.0–8.3)

## 2012-08-10 LAB — LIPID PANEL: HDL: 45 mg/dL (ref >39.00)

## 2012-08-10 LAB — LDL CHOLESTEROL, DIRECT: Direct LDL: 159 mg/dL

## 2012-08-10 MED ORDER — VENLAFAXINE HCL 37.5 MG PO TABS: ORAL_TABLET | ORAL | Status: DC

## 2012-08-10 NOTE — Progress Notes (Signed)
Subjective:    Patient ID: Whitney [name], female    DOB: 1960-06-16, 52 y.o.   MRN: [id number]  HPI  Depression- on Effexor 37.5 mg twice daily daily. Feels it is working well.  Denies any anxiety or depression.  HLD- was on Zocor 40 mg daily.  Stopped taking it at least a year ago. Has lost weight, worked out with a Education officer, environmental and now remodeling her son's house.  Patient Active Problem List   Diagnosis Date Noted  . OBESITY 07/08/2009  . OTHER SPECIFIED ANEMIAS 02/28/2009  . BARRETTS ESOPHAGUS 10/16/2008  . HIATAL HERNIA 10/16/2008  . HYPERLIPIDEMIA 10/09/2008  . CONSTIPATION 10/09/2008  . ESOPHAGITIS, HX OF 10/09/2008  . DYSPHAGIA UNSPECIFIED 09/19/2008  . HERNIATED DISC 08/07/2008  . 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 ADD 07/24/2007  . CHOLELITHIASIS 05/26/2007  . ELEVATED BLOOD PRESSURE WITHOUT DIAGNOSIS OF HYPERTENSION 03/10/2007  . G E R D 09/27/2006  . HYPOGLYCEMIA 08/31/2006  . DEPRESSION 08/31/2006  . ANXIETY 08/17/2006  . HYPERGLYCEMIA 08/17/2006  . HEMORRHOIDS, INTERNAL 02/06/2002  . DIVERTICULAR DISEASE 02/06/2002   Past Medical History  Diagnosis Date  . Personal history of other diseases of digestive system   . Other and unspecified hyperlipidemia   . Internal hemorrhoids without mention of complication   . Diverticulosis of colon (without mention of hemorrhage)   . Unspecified constipation   . Dysphagia, unspecified   . Congenital anomalies of foot, not elsewhere classified   . Displacement of intervertebral disc, site unspecified, without myelopathy   . Other malaise and fatigue   . Chest pain, unspecified   . Attention deficit disorder without mention of hyperactivity   . Calculus of gallbladder without mention of cholecystitis or obstruction   . Abdominal pain, right upper quadrant   . Elevated blood pressure reading without diagnosis of hypertension   . Esophageal reflux   . Depressive disorder, not elsewhere classified   .  Anxiety state, unspecified   . Hypoglycemia, unspecified   . Other abnormal blood chemistry   . High cholesterol    Past Surgical History  Procedure Laterality Date  . Cholecystectomy  06/16/2007    Dr. Lindie Spruce  . Colonoscopy  02/06/2002    diverticula and internal hemorroids  . Cesarean section    . Cystectomy      left breast  . Laminectomy    . Hernia repair     History  Substance Use Topics  . Smoking status: Former Games developer  . Smokeless tobacco: Never Used  . Alcohol Use: Yes   Family History  Problem Relation Age of Onset  . Colon polyps Mother   . Hypertension Father   . Diabetes Father   . Colon polyps Maternal Grandmother   . Cancer Maternal Grandfather     prostate  . Cancer Paternal Grandmother     colon   Allergies  Allergen Reactions  . Sertraline Hcl     REACTION: Headache/ vision changes   Current Outpatient Prescriptions on File Prior to Visit  Medication Sig Dispense Refill  . triamcinolone (KENALOG) 0.1 % cream Apply 1 application topically 2 (two) times daily.  30 g  2  . fluticasone (FLONASE) 50 MCG/ACT nasal spray 2 sprays by Nasal route daily.  16 g  12   No current facility-administered medications on file prior to visit.   The PMH, PSH, Social History, Family History, Medications, and allergies have been reviewed in [facility], and have been updated if relevant.   Review of  Systems    See HPI Objective:   Physical Exam BP 120/88  Pulse 68  Temp(Src) 97.9 F (36.6 C)  Wt 201 lb (91.173 kg)  BMI 30.57 kg/m2  LMP 06/17/2011 Wt Readings from Last 3 Encounters:  08/10/12 201 lb (91.173 kg)  07/30/11 203 lb (92.08 kg)  03/09/11 211 lb 12 oz (96.049 kg)    General:  Well-developed,well-nourished,in no acute distress; alert,appropriate and cooperative throughout examination Head:  normocephalic and atraumatic.   Eyes:  vision grossly intact, pupils equal, pupils round, and pupils reactive to light.   Ears:  R ear normal and L ear normal.    Nose:  no external deformity.   Mouth:  good dentition.   Lungs:  Normal respiratory effort, chest expands symmetrically. Lungs are clear to auscultation, no crackles or wheezes. Heart:  Normal rate and regular rhythm. S1 and S2 normal without gallop, murmur, click, rub or other extra sounds. Psych:  Cognition and judgment appear intact. Alert and cooperative with normal attention span and concentration. No apparent delusions, illusions, hallucinations     Assessment & Plan:  1. HYPERLIPIDEMIA Recheck lipids today.  May not need to restart Zocor given weight loss and life style changes. - Comprehensive metabolic panel - Lipid Panel  2. DEPRESSION Stable on current dose of Effexor.  Rx refilled.

## 2012-08-10 NOTE — Patient Instructions (Addendum)
Good to see you. Good luck renovating your son's house.  We will call you with your lab results.

## 2012-08-11 MED ORDER — SIMVASTATIN 20 MG PO TABS: 20.0000 mg | ORAL_TABLET | Freq: Every evening | ORAL | Status: DC

## 2012-08-11 NOTE — Addendum Note (Signed)
Addended by: Eliezer Bottom on: 08/11/2012 02:28 PM   Modules accepted: Orders

## 2012-10-09 ENCOUNTER — Encounter: Payer: Self-pay | Admitting: Family Medicine

## 2012-10-09 ENCOUNTER — Other Ambulatory Visit (INDEPENDENT_AMBULATORY_CARE_PROVIDER_SITE_OTHER): Payer: Federal, State, Local not specified - PPO

## 2012-10-09 DIAGNOSIS — R030 Elevated blood-pressure reading, without diagnosis of hypertension: Secondary | ICD-10-CM

## 2012-10-09 DIAGNOSIS — E785 Hyperlipidemia, unspecified: Secondary | ICD-10-CM

## 2012-10-09 LAB — COMPREHENSIVE METABOLIC PANEL
ALT: 26 U/L (ref 0–35)
Albumin: 3.6 g/dL (ref 3.5–5.2)
CO2: 27 mEq/L (ref 19–32)
Calcium: 9.2 mg/dL (ref 8.4–10.5)
Chloride: 106 mEq/L (ref 96–112)
GFR: 76.26 mL/min (ref >60.00)
Glucose, Bld: 157 mg/dL — ABNORMAL HIGH (ref 70–99)
Potassium: 4.3 mEq/L (ref 3.5–5.1)
Sodium: 138 mEq/L (ref 135–145)
Total Bilirubin: 0.5 mg/dL (ref 0.3–1.2)
Total Protein: 6.8 g/dL (ref 6.0–8.3)

## 2012-10-09 LAB — LIPID PANEL: Cholesterol: 211 mg/dL — ABNORMAL HIGH (ref 0–200)

## 2013-01-08 ENCOUNTER — Other Ambulatory Visit: Payer: Self-pay | Admitting: Family Medicine

## 2013-02-08 ENCOUNTER — Ambulatory Visit (INDEPENDENT_AMBULATORY_CARE_PROVIDER_SITE_OTHER): Payer: Federal, State, Local not specified - PPO

## 2013-02-08 DIAGNOSIS — Z23 Encounter for immunization: Secondary | ICD-10-CM

## 2013-02-09 ENCOUNTER — Other Ambulatory Visit: Payer: Self-pay | Admitting: Family Medicine

## 2013-02-09 MED ORDER — VENLAFAXINE HCL 37.5 MG PO TABS: ORAL_TABLET | ORAL | Status: DC

## 2013-02-09 NOTE — Telephone Encounter (Signed)
Which medication ?

## 2013-02-09 NOTE — Telephone Encounter (Signed)
Pt states [facility] requesting new RX from our office.

## 2013-03-29 HISTORY — PX: COLONOSCOPY: SHX174

## 2013-04-06 ENCOUNTER — Other Ambulatory Visit: Payer: Self-pay | Admitting: Family Medicine

## 2013-04-06 NOTE — Telephone Encounter (Signed)
Pt requesting medication refill. Last ov 07/2012. pls advise

## 2013-04-11 ENCOUNTER — Other Ambulatory Visit: Payer: Self-pay | Admitting: Family Medicine

## 2013-04-29 ENCOUNTER — Other Ambulatory Visit: Payer: Self-pay | Admitting: Family Medicine

## 2013-04-30 NOTE — Telephone Encounter (Signed)
Pt requesting medication refill. Last ov 07/2012. Medication on Hx list only. pls advise

## 2013-06-01 ENCOUNTER — Telehealth: Payer: Self-pay

## 2013-06-01 NOTE — Telephone Encounter (Signed)
Megan with [facility] said pt fell early AM on 05/31/13;pt fell flat on her face and pt is not sure if she passed out or not, if passed out for <one min. Today pt still feels foggy, sleepy,neck is stiff, h/a, pt feels like she is only breathing at 75% of how she should be breathing and pt has numbness from nose to forehead; [facility] is ED disposition. No available appts today. Agreed pt should go to ED for evaluation. Megan voiced understanding.

## 2013-06-01 NOTE — Telephone Encounter (Signed)
Patient Information:  Caller Name: Sharona  Phone: [phone]  Patient: Whitney [name], Whitney [name]  Gender: Female  DOB: 06-08-1960  Age: 53 Years  PCP: Arnette Norris [facility])  Pregnant: No  Office Follow Up:  Does the office need to follow up with this patient?: No  Instructions For The Office: N/A  RN Note:  Report to [place] who agrees to ED evaluation. Patient made aware and advised to proceed now to ED with another adult driving.  Symptoms  Reason For Call & Symptoms: Patient fell shortly after getting out of bed yesterday, 3/5. Patient reports falling "face forward, flat on face". Patient uncertain if she blacked out or passed out but "woke up with face planted in the carpet". Patient believes if she was unconscious then < 1 minute unconscious because husband had come from shower to check on her after hearing "a boom,"Patient reports being "in a fog" all day yesterday and now concerned today by headache, stiff neck, and "breathing at only about 75% of what is normal for me." Accompanied by numbness in the tip of the nose and upward to top of forehead with continued feeling of sleepiness and "feeling foggy."  Reviewed Health History In EMR: Yes  Reviewed Medications In EMR: Yes  Reviewed Allergies In EMR: Yes  Reviewed Surgeries / Procedures: Yes  Date of Onset of Symptoms: 05/31/2013  Treatments Tried: Rest  Treatments Tried Worked: No OB / GYN:  LMP: Unknown  Guideline(s) Used:  Head Injury  Disposition Per Guideline:   Go to ED Now (or to Office with PCP Approval)  Reason For Disposition Reached:   Sounds like a serious injury to the triager  Advice Given:  N/A  Patient Will Follow Care Advice:  YES

## 2013-06-02 ENCOUNTER — Encounter (HOSPITAL_COMMUNITY): Payer: Self-pay | Admitting: Emergency Medicine

## 2013-06-02 ENCOUNTER — Emergency Department (HOSPITAL_COMMUNITY)
Admission: EM | Admit: 2013-06-02 | Discharge: 2013-06-02 | Disposition: A | Discharge status: Home / Self Care | Payer: Federal, State, Local not specified - PPO | Attending: Emergency Medicine | Admitting: Emergency Medicine

## 2013-06-02 ENCOUNTER — Emergency Department (HOSPITAL_COMMUNITY)
Admission: EM | Admit: 2013-06-02 | Discharge: 2013-06-02 | Disposition: A | Discharge status: Nursing Home (Includes ICF) | Payer: Federal, State, Local not specified - PPO | Source: Home / Self Care | Attending: Family Medicine | Admitting: Family Medicine

## 2013-06-02 DIAGNOSIS — IMO0002 Reserved for concepts with insufficient information to code with codable children: Secondary | ICD-10-CM | POA: Insufficient documentation

## 2013-06-02 DIAGNOSIS — R55 Syncope and collapse: Secondary | ICD-10-CM

## 2013-06-02 DIAGNOSIS — Z87768 Personal history of other specified (corrected) congenital malformations of integument, limbs and musculoskeletal system: Secondary | ICD-10-CM | POA: Insufficient documentation

## 2013-06-02 DIAGNOSIS — R51 Headache: Secondary | ICD-10-CM

## 2013-06-02 DIAGNOSIS — R404 Transient alteration of awareness: Secondary | ICD-10-CM

## 2013-06-02 DIAGNOSIS — S0990XA Unspecified injury of head, initial encounter: Secondary | ICD-10-CM

## 2013-06-02 DIAGNOSIS — R4020 Unspecified coma: Secondary | ICD-10-CM

## 2013-06-02 DIAGNOSIS — R519 Headache, unspecified: Secondary | ICD-10-CM

## 2013-06-02 DIAGNOSIS — Z8739 Personal history of other diseases of the musculoskeletal system and connective tissue: Secondary | ICD-10-CM | POA: Insufficient documentation

## 2013-06-02 DIAGNOSIS — E785 Hyperlipidemia, unspecified: Secondary | ICD-10-CM | POA: Insufficient documentation

## 2013-06-02 DIAGNOSIS — Z8776 Personal history of (corrected) congenital malformations of integument, limbs and musculoskeletal system: Secondary | ICD-10-CM | POA: Insufficient documentation

## 2013-06-02 DIAGNOSIS — Z79899 Other long term (current) drug therapy: Secondary | ICD-10-CM | POA: Insufficient documentation

## 2013-06-02 DIAGNOSIS — F329 Major depressive disorder, single episode, unspecified: Secondary | ICD-10-CM | POA: Insufficient documentation

## 2013-06-02 DIAGNOSIS — F411 Generalized anxiety disorder: Secondary | ICD-10-CM | POA: Insufficient documentation

## 2013-06-02 DIAGNOSIS — S060XAA Concussion with loss of consciousness status unknown, initial encounter: Secondary | ICD-10-CM

## 2013-06-02 DIAGNOSIS — S060X9A Concussion with loss of consciousness of unspecified duration, initial encounter: Secondary | ICD-10-CM

## 2013-06-02 DIAGNOSIS — Y9301 Activity, walking, marching and hiking: Secondary | ICD-10-CM | POA: Insufficient documentation

## 2013-06-02 DIAGNOSIS — S060X0A Concussion without loss of consciousness, initial encounter: Secondary | ICD-10-CM | POA: Insufficient documentation

## 2013-06-02 DIAGNOSIS — W1809XA Striking against other object with subsequent fall, initial encounter: Secondary | ICD-10-CM | POA: Insufficient documentation

## 2013-06-02 DIAGNOSIS — Z8679 Personal history of other diseases of the circulatory system: Secondary | ICD-10-CM | POA: Insufficient documentation

## 2013-06-02 DIAGNOSIS — K219 Gastro-esophageal reflux disease without esophagitis: Secondary | ICD-10-CM | POA: Insufficient documentation

## 2013-06-02 DIAGNOSIS — F3289 Other specified depressive episodes: Secondary | ICD-10-CM | POA: Insufficient documentation

## 2013-06-02 DIAGNOSIS — E7800 Pure hypercholesterolemia, unspecified: Secondary | ICD-10-CM | POA: Insufficient documentation

## 2013-06-02 DIAGNOSIS — Y92009 Unspecified place in unspecified non-institutional (private) residence as the place of occurrence of the external cause: Secondary | ICD-10-CM | POA: Insufficient documentation

## 2013-06-02 LAB — CBC WITH DIFFERENTIAL/PLATELET
BASOS ABS: 0 10*3/uL (ref 0.0–0.1)
Basophils Relative: 0 % (ref 0–1)
EOS ABS: 0.1 10*3/uL (ref 0.0–0.7)
Eosinophils Relative: 1 % (ref 0–5)
HCT: 46.7 % — ABNORMAL HIGH (ref 36.0–46.0)
Hemoglobin: 16.2 g/dL — ABNORMAL HIGH (ref 12.0–15.0)
Lymphocytes Relative: 27 % (ref 12–46)
Lymphs Abs: 2.5 10*3/uL (ref 0.7–4.0)
MCH: 31.2 pg (ref 26.0–34.0)
MCHC: 34.7 g/dL (ref 30.0–36.0)
MCV: 90 fL (ref 78.0–100.0)
Monocytes Absolute: 0.7 10*3/uL (ref 0.1–1.0)
Monocytes Relative: 7 % (ref 3–12)
Neutro Abs: 5.9 10*3/uL (ref 1.7–7.7)
Neutrophils Relative %: 64 % (ref 43–77)
PLATELETS: 364 10*3/uL (ref 150–400)
RBC: 5.19 MIL/uL — ABNORMAL HIGH (ref 3.87–5.11)
RDW: 12.8 % (ref 11.5–15.5)
WBC: 9.3 10*3/uL (ref 4.0–10.5)

## 2013-06-02 LAB — URINALYSIS, ROUTINE W REFLEX MICROSCOPIC
Bilirubin Urine: NEGATIVE
Glucose, UA: NEGATIVE mg/dL
Hgb urine dipstick: NEGATIVE
Ketones, ur: NEGATIVE mg/dL
Nitrite: NEGATIVE
Protein, ur: NEGATIVE mg/dL
Specific Gravity, Urine: 1.014 (ref 1.005–1.030)
UROBILINOGEN UA: 0.2 mg/dL (ref 0.0–1.0)
pH: 7 (ref 5.0–8.0)

## 2013-06-02 LAB — BASIC METABOLIC PANEL
BUN: 14 mg/dL (ref 6–23)
CO2: 28 mEq/L (ref 19–32)
Calcium: 9.8 mg/dL (ref 8.4–10.5)
Chloride: 99 mEq/L (ref 96–112)
Creatinine, Ser: 0.68 mg/dL (ref 0.50–1.10)
GFR calc Af Amer: >90 mL/min (ref >90)
Glucose, Bld: 118 mg/dL — ABNORMAL HIGH (ref 70–99)
POTASSIUM: 4.2 meq/L (ref 3.7–5.3)
SODIUM: 140 meq/L (ref 137–147)

## 2013-06-02 LAB — URINE MICROSCOPIC-ADD ON

## 2013-06-02 LAB — TROPONIN I: Troponin I: <0.3 ng/mL (ref <0.30)

## 2013-06-02 MED ORDER — METOCLOPRAMIDE HCL 5 MG/ML IJ SOLN
10.0000 mg | Freq: Once | INTRAMUSCULAR | Status: AC
  Administered 2013-06-02: 10 mg via INTRAVENOUS
  Filled 2013-06-02: qty 2

## 2013-06-02 MED ORDER — DEXAMETHASONE SODIUM PHOSPHATE 10 MG/ML IJ SOLN
10.0000 mg | Freq: Once | INTRAMUSCULAR | Status: AC
  Administered 2013-06-02: 10 mg via INTRAVENOUS
  Filled 2013-06-02: qty 1

## 2013-06-02 MED ORDER — SODIUM CHLORIDE 0.9 % IV BOLUS (SEPSIS)
1000.0000 mL | Freq: Once | INTRAVENOUS | Status: AC
  Administered 2013-06-02: 1000 mL via INTRAVENOUS

## 2013-06-02 MED ORDER — DIPHENHYDRAMINE HCL 50 MG/ML IJ SOLN
25.0000 mg | Freq: Once | INTRAMUSCULAR | Status: AC
  Administered 2013-06-02: 25 mg via INTRAVENOUS
  Filled 2013-06-02: qty 1

## 2013-06-02 NOTE — ED Notes (Signed)
EDP at bedside  

## 2013-06-02 NOTE — ED Notes (Signed)
Pt is concerned that she needs a CT scan of her head. Informed EDP. Stated that he will talk to pt and made pt aware.

## 2013-06-02 NOTE — ED Notes (Signed)
Pt stated that she woke up face down on Thursday. She remembers walking in her bedroom but nothing after that. Since then she has been more sleepy than normal. She stated that she has been having a headache since Thursday. Along with neck and back pain. She has an abrasion on her nose. She stated that on Thursday she had clear drainage. No blood. No blurred vision, weakness or other neurological deficits at the time. No cardiac or respiratory distress. Will continue to monitor.

## 2013-06-02 NOTE — ED Provider Notes (Signed)
Medical screening examination/treatment/procedure(s) were performed by resident physician or non-physician practitioner and as supervising physician I was immediately available for consultation/collaboration.   Pauline Good MD.   Billy Fischer, MD 06/02/13 [phone]

## 2013-06-02 NOTE — ED Provider Notes (Signed)
CSN: [id number]     Arrival date & time 06/02/13  1346 History   First MD Initiated Contact with Patient 06/02/13 1551     Chief Complaint  Patient presents with  . Fall    head injury.     (Consider location/radiation/quality/duration/timing/severity/associated sxs/prior Treatment) HPI Comments: 53 year old female presents complaining of headache and facial numbness. On Thursday morning, she got out of bed and fell. She remembers getting out of bed but does not remember falling. She said she must have knocked herself out. Her husband thinks it was probably about 30 seconds before she regained consciousness. She has had a constant headache since then. She also has had  numbness in the area of her upper lip to her forehead. She also admits to some general weakness and fatigue. She feels like since this happened, she just wants to sleep all the time. She has taken Advil which helps temporarily but the headache returns. The headache has not gotten any better or worse. She denies any history of seizures. Denies any history of arrhythmia or syncopal episodes. She cannot clearly say whether she lost consciousness and then fell or fell and caused herself to lose consciousness by hitting the ground.  Patient is a 53 y.o. female presenting with fall.  Fall Associated symptoms include headaches. Pertinent negatives include no chest pain, no abdominal pain and no shortness of breath.    Past Medical History  Diagnosis Date  . Personal history of other diseases of digestive system   . Other and unspecified hyperlipidemia   . Internal hemorrhoids without mention of complication   . Diverticulosis of colon (without mention of hemorrhage)   . Unspecified constipation   . Dysphagia, unspecified(787.20)   . Congenital anomalies of foot, not elsewhere classified   . Displacement of intervertebral disc, site unspecified, without myelopathy   . Other malaise and fatigue   . Chest pain, unspecified   .  Attention deficit disorder without mention of hyperactivity   . Calculus of gallbladder without mention of cholecystitis or obstruction   . Abdominal pain, right upper quadrant   . Elevated blood pressure reading without diagnosis of hypertension   . Esophageal reflux   . Depressive disorder, not elsewhere classified   . Anxiety state, unspecified   . Hypoglycemia, unspecified   . Other abnormal blood chemistry   . High cholesterol    Past Surgical History  Procedure Laterality Date  . Cholecystectomy  06/16/2007    Dr. Hulen Skains  . Colonoscopy  02/06/2002    diverticula and internal hemorroids  . Cesarean section    . Cystectomy      left breast  . Laminectomy    . Hernia repair     Family History  Problem Relation Age of Onset  . Colon polyps Mother   . Hypertension Father   . Diabetes Father   . Colon polyps Maternal Grandmother   . Cancer Maternal Grandfather     prostate  . Cancer Paternal Grandmother     colon   History  Substance Use Topics  . Smoking status: Former Research scientist (life sciences)  . Smokeless tobacco: Never Used  . Alcohol Use: Yes   OB History   Grav Para Term Preterm Abortions TAB SAB Ect Mult Living                 Review of Systems  Constitutional: Positive for fatigue. Negative for fever and chills.  HENT: Positive for congestion.   Eyes: Negative for visual disturbance.  Respiratory: Negative  for cough and shortness of breath.   Cardiovascular: Negative for chest pain, palpitations and leg swelling.  Gastrointestinal: Negative for nausea, vomiting and abdominal pain.  Endocrine: Negative for polydipsia and polyuria.  Genitourinary: Negative for dysuria, urgency and frequency.  Musculoskeletal: Negative for arthralgias and myalgias.  Skin: Negative for rash.  Neurological: Positive for numbness and headaches. Negative for dizziness, weakness and light-headedness.    Allergies  Sertraline hcl  Home Medications   Current Outpatient Rx  Name  Route  Sig   Dispense  Refill  . simvastatin (ZOCOR) 20 MG tablet      TAKE 1 TABLET (20 MG TOTAL) BY MOUTH EVERY EVENING.   30 tablet   2   . venlafaxine (EFFEXOR) 37.5 MG tablet      TAKE 1 TABLET BY MOUTH DAILY   30 tablet   2   . cyclobenzaprine (FLEXERIL) 5 MG tablet      TAKE 1 TABLET (5 MG TOTAL) BY MOUTH 3 (THREE) TIMES DAILY AS NEEDED FOR MUSCLE SPASMS.   30 tablet   1   . EXPIRED: fluticasone (FLONASE) 50 MCG/ACT nasal spray   Nasal   2 sprays by Nasal route daily.   16 g   12   . triamcinolone (KENALOG) 0.1 % cream   Topical   Apply 1 application topically 2 (two) times daily.   30 g   2    BP 143/91  Pulse 93  Temp(Src) 99.1 F (37.3 C) (Oral)  Resp 21  SpO2 100%  LMP 06/17/2011 Physical Exam  Nursing note and vitals reviewed. Constitutional: She is oriented to person, place, and time. Vital signs are normal. She appears well-developed and well-nourished. No distress.  HENT:  Head: Normocephalic. Head is with abrasion (abrasion with ecchymosis and swelling over the bridge of the nose) and with contusion. Head is without raccoon's eyes and without Battle's sign.  Right Ear: No hemotympanum.  Left Ear: No hemotympanum.  Nose: Sinus tenderness (bridge of the nose) present.  Cardiovascular: Normal rate, regular rhythm and normal heart sounds.  Exam reveals no gallop and no friction rub.   No murmur heard. Pulmonary/Chest: Effort normal and breath sounds normal. No respiratory distress.  Neurological: She is alert and oriented to person, place, and time. She has normal strength and normal reflexes. No cranial nerve deficit or sensory deficit. She exhibits normal muscle tone. She displays no seizure activity. Coordination and gait normal. GCS eye subscore is 4. GCS verbal subscore is 5. GCS motor subscore is 6.  Skin: Skin is warm and dry. No rash noted. She is not diaphoretic.  Psychiatric: She has a normal mood and affect. Judgment normal.    ED Course  Procedures  (including critical care time) Labs Review Labs Reviewed - No data to display Imaging Review No results found.   MDM   1. Head injury   2. Loss of consciousness    Pt needs more workup than we can do here, would probably benefit from CT or MRI of her head.  Transferred to ED via shuttle in stable condition        Liam Graham, PA-C 06/02/13 1656

## 2013-06-02 NOTE — ED Notes (Signed)
The pt was [facility] after she fell on Thursday.  She struck her face.  Unable to determine why she fell.  Since then she has had neck and head pain and she has a bruise across the bridge of her nose

## 2013-06-02 NOTE — Discharge Instructions (Signed)
Concussion, Adult °A concussion, or closed-head injury, is a brain injury caused by a direct blow to the head or by a quick and sudden movement (jolt) of the head or neck. Concussions are usually not life-threatening. Even so, the effects of a concussion can be serious. If you have had a concussion before, you are more likely to experience concussion-like symptoms after a direct blow to the head.  °CAUSES  °· Direct blow to the head, such as from running into another player during a soccer game, being hit in a fight, or hitting your head on a hard surface. °· A jolt of the head or neck that causes the brain to move back and forth inside the skull, such as in a car crash. °SIGNS AND SYMPTOMS  °The signs of a concussion can be hard to notice. Early on, they may be missed by you, family members, and health care providers. You may look fine but act or feel differently. °Symptoms are usually temporary, but they may last for days, weeks, or even longer. Some symptoms may appear right away while others may not show up for hours or days. Every head injury is different. Symptoms include:  °· Mild to moderate headaches that will not go away. °· A feeling of pressure inside your head.  °· Having more trouble than usual:   °· Learning or remembering things you have heard. °· Answering questions.  °· Paying attention or concentrating.   °· Organizing daily tasks.   °· Making decisions and solving problems.   °· Slowness in thinking, acting or reacting, speaking, or reading.   °· Getting lost or being easily confused.   °· Feeling tired all the time or lacking energy (fatigued).   °· Feeling drowsy.   °· Sleep disturbances.   °· Sleeping more than usual.   °· Sleeping less than usual.   °· Trouble falling asleep.   °· Trouble sleeping (insomnia).   °· Loss of balance or feeling lightheaded or dizzy.   °· Nausea or vomiting.   °· Numbness or tingling.   °· Increased sensitivity to:   °· Sounds.   °· Lights.   °· Distractions.    °· Vision problems or eyes that tire easily.   °· Diminished sense of taste or smell.   °· Ringing in the ears.   °· Mood changes such as feeling sad or anxious.   °· Becoming easily irritated or angry for little or no reason.   °· Lack of motivation. °· Seeing or hearing things other people do not see or hear (hallucinations). °DIAGNOSIS  °Your health care provider can usually diagnose a concussion based on a description of your injury and symptoms. He or she will ask whether you passed out (lost consciousness) and whether you are having trouble remembering events that happened right before and during your injury.  °Your evaluation might include:  °· A brain scan to look for signs of injury to the brain. Even if the test shows no injury, you may still have a concussion.   °· Blood tests to be sure other problems are not present. °TREATMENT  °· Concussions are usually treated in an emergency department, in urgent care, or at a clinic. You may need to stay in the hospital overnight for further treatment.   °· Tell your health care provider if you are taking any medicines, including prescription medicines, over-the-counter medicines, and natural remedies. Some medicines, such as blood thinners (anticoagulants) and aspirin, may increase the chance of complications. Also tell your health care provider whether you have had alcohol or are taking illegal drugs. This information may affect treatment. °· Your health care provider will send you   home with important instructions to follow.  How fast you will recover from a concussion depends on many factors. These factors include how severe your concussion is, what part of your brain was injured, your age, and how healthy you were before the concussion.  Most people with mild injuries recover fully. Recovery can take time. In general, recovery is slower in older persons. Also, persons who have had a concussion in the past or have other medical problems may find that it  takes longer to recover from their current injury. HOME CARE INSTRUCTIONS  General Instructions  Carefully follow the directions your health care provider gave you.  Only take over-the-counter or prescription medicines for pain, discomfort, or fever as directed by your health care provider.  Take only those medicines that your health care provider has approved.  Do not drink alcohol until your health care provider says you are well enough to do so. Alcohol and certain other drugs may slow your recovery and can put you at risk of further injury.  If it is harder than usual to remember things, write them down.  If you are easily distracted, try to do one thing at a time. For example, do not try to watch TV while fixing dinner.  Talk with family members or close friends when making important decisions.  Keep all follow-up appointments. Repeated evaluation of your symptoms is recommended for your recovery.  Watch your symptoms and tell others to do the same. Complications sometimes occur after a concussion. Older adults with a brain injury may have a higher risk of serious complications such as of a blood clot on the brain.  Tell your teachers, school nurse, school counselor, coach, athletic trainer, or work Freight forwarder about your injury, symptoms, and restrictions. Tell them about what you can or cannot do. They should watch for:   Increased problems with attention or concentration.   Increased difficulty remembering or learning new information.   Increased time needed to complete tasks or assignments.   Increased irritability or decreased ability to cope with stress.   Increased symptoms.   Rest. Rest helps the brain to heal. Make sure you:  Get plenty of sleep at night. Avoid staying up late at night.  Keep the same bedtime hours on weekends and weekdays.  Rest during the day. Take daytime naps or rest breaks when you feel tired.  Limit activities that require a lot of  thought or concentration. These includes   Doing homework or job-related work.   Watching TV.   Working on the computer.  Avoid any situation where there is potential for another head injury (football, hockey, soccer, basketball, martial arts, downhill snow sports and horseback riding). Your condition will get worse every time you experience a concussion. You should avoid these activities until you are evaluated by the appropriate follow-up caregivers. Returning To Your Regular Activities You will need to return to your normal activities slowly, not all at once. You must give your body and brain enough time for recovery.  Do not return to sports or other athletic activities until your health care provider tells you it is safe to do so.  Ask your health care provider when you can drive, ride a bicycle, or operate heavy machinery. Your ability to react may be slower after a brain injury. Never do these activities if you are dizzy.  Ask your health care provider about when you can return to work or school. Preventing Another Concussion It is very important to avoid another  brain injury, especially before you have recovered. In rare cases, another injury can lead to permanent brain damage, brain swelling, or death. The risk of this is greatest during the first 7 10 days after a head injury. Avoid injuries by:   Wearing a seat belt when riding in a car.   Drinking alcohol only in moderation.   Wearing a helmet when biking, skiing, skateboarding, skating, or doing similar activities.  Avoiding activities that could lead to a second concussion, such as contact or recreational sports, until your health care provider says it is OK.  Taking safety measures in your home.   Remove clutter and tripping hazards from floors and stairways.   Use grab bars in bathrooms and handrails by stairs.   Place non-slip mats on floors and in bathtubs.   Improve lighting in dim areas. SEEK MEDICAL  CARE IF:   You have increased problems paying attention or concentrating.   You have increased difficulty remembering or learning new information.   You need more time to complete tasks or assignments than before.   You have increased irritability or decreased ability to cope with stress.  You have more symptoms than before. Seek medical care if you have any of the following symptoms for more than 2 weeks after your injury:   Lasting (chronic) headaches.   Dizziness or balance problems.   Nausea.  Vision problems.   Increased sensitivity to noise or light.   Depression or mood swings.   Anxiety or irritability.   Memory problems.   Difficulty concentrating or paying attention.   Sleep problems.   Feeling tired all the time. SEEK IMMEDIATE MEDICAL CARE IF:   You have severe or worsening headaches. These may be a sign of a blood clot in the brain.  You have weakness (even if only in one hand, leg, or part of the face).  You have numbness.  You have decreased coordination.   You vomit repeatedly.  You have increased sleepiness.  One pupil is larger than the other.   You have convulsions.   You have slurred speech.   You have increased confusion. This may be a sign of a blood clot in the brain.  You have increased restlessness, agitation, or irritability.   You are unable to recognize people or places.   You have neck pain.   It is difficult to wake you up.   You have unusual behavior changes.   You lose consciousness. MAKE SURE YOU:   Understand these instructions.  Will watch your condition.  Will get help right away if you are not doing well or get worse. Document Released: 06/05/2003 Document Revised: 11/15/2012 Document Reviewed: 10/05/2012 [facility] Patient Information 2014 [place], [place].  Syncope Syncope is a fainting spell. This means the person loses consciousness and drops to the ground. The person is generally  unconscious for less than 5 minutes. The person may have some muscle twitches for up to 15 seconds before waking up and returning to normal. Syncope occurs more often in elderly people, but it can happen to anyone. While most causes of syncope are not dangerous, syncope can be a sign of a serious medical problem. It is important to seek medical care.  CAUSES  Syncope is caused by a sudden decrease in blood flow to the brain. The specific cause is often not determined. Factors that can trigger syncope include:  Taking medicines that lower blood pressure.  Sudden changes in posture, such as standing up suddenly.  Taking more  medicine than prescribed.  Standing in one place for too long.  Seizure disorders.  Dehydration and excessive exposure to heat.  Low blood sugar (hypoglycemia).  Straining to have a bowel movement.  Heart disease, irregular heartbeat, or other circulatory problems.  Fear, emotional distress, seeing blood, or severe pain. SYMPTOMS  Right before fainting, you may:  Feel dizzy or lightheaded.  Feel nauseous.  See all white or all black in your field of vision.  Have cold, clammy skin. DIAGNOSIS  Your caregiver will ask about your symptoms, perform a physical exam, and perform electrocardiography (ECG) to record the electrical activity of your heart. Your caregiver may also perform other heart or blood tests to determine the cause of your syncope. TREATMENT  In most cases, no treatment is needed. Depending on the cause of your syncope, your caregiver may recommend changing or stopping some of your medicines. HOME CARE INSTRUCTIONS  Have someone stay with you until you feel stable.  Do not drive, operate machinery, or play sports until your caregiver says it is okay.  Keep all follow-up appointments as directed by your caregiver.  Lie down right away if you start feeling like you might faint. Breathe deeply and steadily. Wait until all the symptoms have  passed.  Drink enough fluids to keep your urine clear or pale yellow.  If you are taking blood pressure or heart medicine, get up slowly, taking several minutes to sit and then stand. This can reduce dizziness. SEEK IMMEDIATE MEDICAL CARE IF:   You have a severe headache.  You have unusual pain in the chest, abdomen, or back.  You are bleeding from the mouth or rectum, or you have black or tarry stool.  You have an irregular or very fast heartbeat.  You have pain with breathing.  You have repeated fainting or seizure-like jerking during an episode.  You faint when sitting or lying down.  You have confusion.  You have difficulty walking.  You have severe weakness.  You have vision problems. If you fainted, call your local emergency services (911 in [place] Do not drive yourself to the hospital.  MAKE SURE YOU:  Understand these instructions.  Will watch your condition.  Will get help right away if you are not doing well or get worse. Document Released: 03/15/2005 Document Revised: 09/14/2011 Document Reviewed: 05/14/2011 [facility] Patient Information 2014 [place].

## 2013-06-02 NOTE — ED Provider Notes (Signed)
CSN: [id number]     Arrival date & time 06/02/13  1705 History   First MD Initiated Contact with Patient 06/02/13 1728     Chief Complaint  Patient presents with  . Fall     (Consider location/radiation/quality/duration/timing/severity/associated sxs/prior Treatment) Patient is a 53 y.o. female presenting with fall and syncope.  Fall This is a new problem. The current episode started 2 days ago. Episode frequency: Once. The problem has been resolved. Associated symptoms include headaches. Pertinent negatives include no chest pain, no abdominal pain and no shortness of breath. Nothing aggravates the symptoms. Nothing relieves the symptoms.  Loss of Consciousness Episode history:  Single Most recent episode:  2 days ago Duration: Seconds to minutes. Timing:  Constant Progression:  Resolved Chronicity:  New Context: standing up (Getting up from bed. Took no more than 6 steps prior to losing consciousness the)   Witnessed: no   Relieved by:  Nothing Associated symptoms: headaches   Associated symptoms: no chest pain, no diaphoresis, no difficulty breathing, no dizziness, no nausea, no shortness of breath and no vomiting     Past Medical History  Diagnosis Date  . Personal history of other diseases of digestive system   . Other and unspecified hyperlipidemia   . Internal hemorrhoids without mention of complication   . Diverticulosis of colon (without mention of hemorrhage)   . Unspecified constipation   . Dysphagia, unspecified(787.20)   . Congenital anomalies of foot, not elsewhere classified   . Displacement of intervertebral disc, site unspecified, without myelopathy   . Other malaise and fatigue   . Chest pain, unspecified   . Attention deficit disorder without mention of hyperactivity   . Calculus of gallbladder without mention of cholecystitis or obstruction   . Abdominal pain, right upper quadrant   . Elevated blood pressure reading without diagnosis of hypertension   .  Esophageal reflux   . Depressive disorder, not elsewhere classified   . Anxiety state, unspecified   . Hypoglycemia, unspecified   . Other abnormal blood chemistry   . High cholesterol    Past Surgical History  Procedure Laterality Date  . Cholecystectomy  06/16/2007    Dr. Hulen Skains  . Colonoscopy  02/06/2002    diverticula and internal hemorroids  . Cesarean section    . Cystectomy      left breast  . Laminectomy    . Hernia repair     Family History  Problem Relation Age of Onset  . Colon polyps Mother   . Hypertension Father   . Diabetes Father   . Colon polyps Maternal Grandmother   . Cancer Maternal Grandfather     prostate  . Cancer Paternal Grandmother     colon   History  Substance Use Topics  . Smoking status: Former Research scientist (life sciences)  . Smokeless tobacco: Never Used  . Alcohol Use: Yes   OB History   Grav Para Term Preterm Abortions TAB SAB Ect Mult Living                 Review of Systems  Constitutional: Negative for diaphoresis.  Respiratory: Negative for shortness of breath.   Cardiovascular: Positive for syncope. Negative for chest pain.  Gastrointestinal: Negative for nausea, vomiting and abdominal pain.  Neurological: Positive for headaches. Negative for dizziness.  All other systems reviewed and are negative.      Allergies  Sertraline hcl  Home Medications   Current Outpatient Rx  Name  Route  Sig  Dispense  Refill  .  Aspirin-Salicylamide-Caffeine (BC HEADACHE POWDER PO)   Oral   Take 1 packet by mouth daily as needed (pain).         . cyclobenzaprine (FLEXERIL) 5 MG tablet   Oral   Take 5 mg by mouth 3 (three) times daily as needed for muscle spasms.         . fluticasone (FLONASE) 50 MCG/ACT nasal spray   Each Nare   Place 2 sprays into both nostrils daily as needed for allergies or rhinitis.         Marland Kitchen ibuprofen (ADVIL,MOTRIN) 200 MG tablet   Oral   Take 800 mg by mouth every 6 (six) hours as needed for moderate pain.          Marland Kitchen omeprazole (PRILOSEC OTC) 20 MG tablet   Oral   Take 20 mg by mouth daily.         . simvastatin (ZOCOR) 20 MG tablet   Oral   Take 20 mg by mouth every evening.         . venlafaxine (EFFEXOR) 37.5 MG tablet   Oral   Take 37.5 mg by mouth every evening.          BP 127/87  Pulse 92  Temp(Src) 98.1 F (36.7 C) (Oral)  Resp 18  Ht 5\' 8"  (1.727 m)  Wt 202 lb (91.627 kg)  BMI 30.72 kg/m2  SpO2 94%  LMP 06/17/2011 Physical Exam  Nursing note and vitals reviewed. Constitutional: She is oriented to person, place, and time. She appears well-developed and well-nourished. No distress.  HENT:  Head: Normocephalic. Head is with abrasion (Bridge of nose).  Nose: Right sinus exhibits no maxillary sinus tenderness and no frontal sinus tenderness. Left sinus exhibits no maxillary sinus tenderness and no frontal sinus tenderness.  Mouth/Throat: Oropharynx is clear and moist.  Eyes: Conjunctivae are normal. Pupils are equal, round, and reactive to light. No scleral icterus.  Neck: Normal range of motion. Neck supple. Muscular tenderness (right) present. No spinous process tenderness present.  Cardiovascular: Normal rate, regular rhythm, normal heart sounds and intact distal pulses.   No murmur heard. Pulmonary/Chest: Effort normal and breath sounds normal. No stridor. No respiratory distress. She has no rales.  Abdominal: Soft. Bowel sounds are normal. She exhibits no distension. There is no tenderness.  Musculoskeletal: Normal range of motion.  Neurological: She is alert and oriented to person, place, and time. She has normal strength. No cranial nerve deficit or sensory deficit. Coordination and gait normal. GCS eye subscore is 4. GCS verbal subscore is 5. GCS motor subscore is 6.  Reflex Scores:      Patellar reflexes are 1+ on the right side and 1+ on the left side. Skin: Skin is warm and dry. No rash noted.  Psychiatric: She has a normal mood and affect. Her behavior is normal.     ED Course  Procedures (including critical care time) Labs Review Labs Reviewed  CBC WITH DIFFERENTIAL - Abnormal; Notable for the following:    RBC 5.19 (*)    Hemoglobin 16.2 (*)    HCT 46.7 (*)    All other components within normal limits  BASIC METABOLIC PANEL - Abnormal; Notable for the following:    Glucose, Bld 118 (*)    All other components within normal limits  URINALYSIS, ROUTINE W REFLEX MICROSCOPIC - Abnormal; Notable for the following:    APPearance CLOUDY (*)    Leukocytes, UA MODERATE (*)    All other components within normal limits  URINE MICROSCOPIC-ADD ON - Abnormal; Notable for the following:    Squamous Epithelial / LPF FEW (*)    All other components within normal limits  TROPONIN I   Imaging Review No results found.   EKG Interpretation   Date/Time:  Saturday June 02 2013 19:21:01 EST Ventricular Rate:  88 PR Interval:  130 QRS Duration: 92 QT Interval:  373 QTC Calculation: 451 R Axis:   10 Text Interpretation:  Sinus rhythm No significant change was found  Confirmed by [employer]  MD, TREY ([id number]) on 06/02/2013 10:10:44 PM      MDM   Final diagnoses:  Syncope  Headache  Concussion    53 year old female presenting 2 days after a syncope episode and fall. She states she had just up from bed, took a few steps, and then lost consciousness. She thinks that she felt her knees and then fell forward onto her face. Since that time, she has been feeling more sleepy than usual and has had a generalized headache. The pain persisted, so she went to urgent care today, and was sent from there to the ER.    On exam, she has an abrasion to her face, but no other signs of facial trauma. I have a low suspicion for intracranial traumatic injury, especially 2 days out from event.  I do not think head or C-spine imaging is warranted. However, her symptoms are consistent with concussion. She will also require a basic syncope evaluation.  10:31 PM better after  headache cocktail. Lab workup unremarkable. EKG normal, with no Brugada, delta waves, or prolonged QTC. She appears stable for discharge home with PCP followup. Given concussion precautions  Arbie Cookey, MD 06/02/13 2233

## 2013-06-02 NOTE — ED Notes (Signed)
Reports falling Thursday morning with knees hitting floor then face first.  States"pain is felt from mouth to back of head and behind eyes."  Very fatigue.  Denies n/v  Mild relief with advil.

## 2013-06-04 ENCOUNTER — Other Ambulatory Visit: Payer: Self-pay | Admitting: Internal Medicine

## 2013-06-08 ENCOUNTER — Encounter: Payer: Self-pay | Admitting: Family Medicine

## 2013-06-08 ENCOUNTER — Other Ambulatory Visit: Payer: Self-pay

## 2013-06-08 ENCOUNTER — Ambulatory Visit (INDEPENDENT_AMBULATORY_CARE_PROVIDER_SITE_OTHER): Payer: Federal, State, Local not specified - PPO | Admitting: Family Medicine

## 2013-06-08 VITALS — BP 120/90 | HR 93 | Temp 97.7°F | Ht 65.5 in | Wt 200.5 lb

## 2013-06-08 DIAGNOSIS — R55 Syncope and collapse: Secondary | ICD-10-CM

## 2013-06-08 DIAGNOSIS — F0781 Postconcussional syndrome: Secondary | ICD-10-CM | POA: Insufficient documentation

## 2013-06-08 MED ORDER — VENLAFAXINE HCL 37.5 MG PO TABS: 37.5000 mg | ORAL_TABLET | Freq: Every evening | ORAL | Status: DC

## 2013-06-08 NOTE — Telephone Encounter (Signed)
Spoke to pt and informed her Rx has been sent to requested pharmacy. Informed pt that she will need a f/u appt for future appts. Today's visit was for hospital f/u only. Pt verbally expressed understanding.

## 2013-06-08 NOTE — Progress Notes (Signed)
Pre visit review using our clinic review tool, if applicable. No additional management support is needed unless otherwise documented below in the visit note. 

## 2013-06-08 NOTE — Assessment & Plan Note (Signed)
Discussed course and symptoms of post concussive syndrome along with red flag symptoms (see AVS).  Symptoms appear mild at this point.  Advised "brain" rest. Call or return to clinic prn if these symptoms worsen or fail to improve as anticipated.

## 2013-06-08 NOTE — Progress Notes (Signed)
Subjective:   Patient ID: Whitney [name], female    DOB: [birth year], 53 y.o.   MRN: [id number]  Whitney [name] is a pleasant 53 y.o. year old female who presents to clinic today with Hospitalization Follow-up  on 06/08/2013  HPI: 53 year old female here for ER follow up.  Notes reviewed.  Went to UC and then sent to ED on 3/7 s/p syncope.  Woke up at 6:30 am because dog was barking to go outside.  Felt fine when she stood up.  Started walking down the hall and that is the last thing she remembered.  Woke up on the floor, face down with hands at her side.  Thinks she landed on her knees and then head- her husband heard two thuds.  Took her at least 30 seconds before she could communicate with her husband.  Was not feeling ill the night before or that morning prior to the fall.  Does not remember feeling dizzy, nauseated, or diaphoretic.  Denies having CP.  On exam, she had an abrasion to her face, but no other signs of facial trauma.  EKG normal, with no Brugada, delta waves, or prolonged QTC.  Head CT and C spine were not done- felt she had a concussion and advised to follow up with me here today.  Has been "out of it," has had some headaches since this happened.  Denies feeling nauseated.  No photophobia or dizziness.       She does take zocor for HLD.  Former smoker.  Has had cardiac cath in past.  Lab Results  Component Value Date   CHOL 211* 10/09/2012   HDL 47.90 10/09/2012   LDLCALC 99 12/09/2009   LDLDIRECT 133.7 10/09/2012   TRIG 225.0* 10/09/2012   CHOLHDL 4 10/09/2012   Patient Active Problem List   Diagnosis Date Noted  . Post concussion syndrome 06/08/2013  . Syncope 06/08/2013  . OBESITY 07/08/2009  . OTHER SPECIFIED ANEMIAS 02/28/2009  . BARRETTS ESOPHAGUS 10/16/2008  . HIATAL HERNIA 10/16/2008  . HYPERLIPIDEMIA 10/09/2008  . CONSTIPATION 10/09/2008  . ESOPHAGITIS, HX OF 10/09/2008  . DYSPHAGIA UNSPECIFIED 09/19/2008  . HERNIATED DISC 08/07/2008  . 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 ADD 07/24/2007  . CHOLELITHIASIS 05/26/2007  . ELEVATED BLOOD PRESSURE WITHOUT DIAGNOSIS OF HYPERTENSION 03/10/2007  . G E R D 09/27/2006  . HYPOGLYCEMIA 08/31/2006  . DEPRESSION 08/31/2006  . ANXIETY 08/17/2006  . HYPERGLYCEMIA 08/17/2006  . HEMORRHOIDS, INTERNAL 02/06/2002  . DIVERTICULAR DISEASE 02/06/2002   Past Medical History  Diagnosis Date  . Personal history of other diseases of digestive system   . Other and unspecified hyperlipidemia   . Internal hemorrhoids without mention of complication   . Diverticulosis of colon (without mention of hemorrhage)   . Unspecified constipation   . Dysphagia, unspecified(787.20)   . Congenital anomalies of foot, not elsewhere classified   . Displacement of intervertebral disc, site unspecified, without myelopathy   . Other malaise and fatigue   . Chest pain, unspecified   . Attention deficit disorder without mention of hyperactivity   . Calculus of gallbladder without mention of cholecystitis or obstruction   . Abdominal pain, right upper quadrant   . Elevated blood pressure reading without diagnosis of hypertension   . Esophageal reflux   . Depressive disorder, not elsewhere classified   . Anxiety state, unspecified   . Hypoglycemia, unspecified   . Other abnormal blood chemistry   . High cholesterol    Past Surgical  History  Procedure Laterality Date  . Cholecystectomy  06/16/2007    Dr. Hulen Skains  . Colonoscopy  02/06/2002    diverticula and internal hemorroids  . Cesarean section    . Cystectomy      left breast  . Laminectomy    . Hernia repair     History  Substance Use Topics  . Smoking status: Former Research scientist (life sciences)  . Smokeless tobacco: Never Used  . Alcohol Use: Yes   Family History  Problem Relation Age of Onset  . Colon polyps Mother   . Hypertension Father   . Diabetes Father   . Colon polyps Maternal Grandmother   . Cancer Maternal Grandfather     prostate  . Cancer Paternal  Grandmother     colon   Allergies  Allergen Reactions  . Sertraline Hcl     REACTION: Headache/ vision changes   Current Outpatient Prescriptions on File Prior to Visit  Medication Sig Dispense Refill  . Aspirin-Salicylamide-Caffeine (BC HEADACHE POWDER PO) Take 1 packet by mouth daily as needed (pain).      . cyclobenzaprine (FLEXERIL) 5 MG tablet Take 5 mg by mouth 3 (three) times daily as needed for muscle spasms.      . fluticasone (FLONASE) 50 MCG/ACT nasal spray Place 2 sprays into both nostrils daily as needed for allergies or rhinitis.      Marland Kitchen omeprazole (PRILOSEC OTC) 20 MG tablet Take 20 mg by mouth daily.      . simvastatin (ZOCOR) 20 MG tablet Take 20 mg by mouth every evening.      . venlafaxine (EFFEXOR) 37.5 MG tablet Take 37.5 mg by mouth every evening.       No current facility-administered medications on file prior to visit.   The PMH, PSH, Social History, Family History, Medications, and allergies have been reviewed in [facility], and have been updated if relevant.    Review of Systems See HPI No blurred vision No UE weakness No DOE No orthopnea    Objective:    BP 120/90  Pulse 93  Temp(Src) 97.7 F (36.5 C) (Oral)  Ht 5' 5.5" (1.664 m)  Wt 200 lb 8 oz (90.946 kg)  BMI 32.85 kg/m2  SpO2 96%  LMP 06/17/2011   Physical Exam  Nursing note and vitals reviewed. Constitutional: She is oriented to person, place, and time. She appears well-developed and well-nourished. No distress.  HENT:  Nose:    Cardiovascular: Normal rate, regular rhythm and normal pulses.   No  bruits  Pulmonary/Chest: Effort normal and breath sounds normal.  Abdominal: Soft. Normal appearance and bowel sounds are normal.  Neurological: She is alert and oriented to person, place, and time. She has normal strength and normal reflexes. No cranial nerve deficit or sensory deficit. She displays a negative Romberg sign.  Psychiatric: She has a normal mood and affect. Her speech is normal and  behavior is normal. Judgment and thought content normal. Cognition and memory are normal.          Assessment & Plan:   Post concussion syndrome No Follow-up on file.

## 2013-06-08 NOTE — Patient Instructions (Signed)
Great to see you. Please stop by to see Rosaria Ferries on your way out to set up your cardiology referral.  Post-Concussion Syndrome Post-concussion syndrome describes the symptoms that can occur after a head injury. These symptoms can last from weeks to months. CAUSES  It is not clear why some head injuries cause post-concussion syndrome. It can occur whether your head injury was mild or severe and whether you were wearing head protection or not.  SIGNS AND SYMPTOMS  Memory difficulties.  Dizziness.  Headaches.  Double vision or blurry vision.  Sensitivity to light.  Hearing difficulties.  Depression.  Tiredness.  Weakness.  Difficulty with concentration.  Difficulty sleeping or staying asleep.  Vomiting.  Poor balance or instability on your feet.  Slow reaction time.  Difficulty learning and remembering things you have heard. DIAGNOSIS  There is no test to determine whether you have post-concussion syndrome. Your health care provider may order an imaging scan of your brain, such as a CT scan, to check for other problems that may be causing your symptoms (such as severe injury inside your skull). TREATMENT  Usually, these problems disappear over time without medical care. Your health care provider may prescribe medicine to help ease your symptoms. It is important to follow up with a neurologist to evaluate your recovery and address any lingering symptoms or issues. HOME CARE INSTRUCTIONS   Only take over-the-counter or prescription medicines for pain, discomfort, or fever as directed by your health care provider. Do not take aspirin. Aspirin can slow blood clotting.  Sleep with your head slightly elevated to help with headaches.  Avoid any situation where there is potential for another head injury (football, hockey, soccer, basketball, martial arts, downhill snow sports, and horseback riding). Your condition will get worse every time you experience a concussion. You should  avoid these activities until you are evaluated by the appropriate follow-up health care providers.  Keep all follow-up appointments as directed by your health care provider. SEEK IMMEDIATE MEDICAL CARE IF:  You develop confusion or unusual drowsiness.  You cannot wake the injured person.  You develop nausea or persistent, forceful vomiting.  You feel like you are moving when you are not (vertigo).  You notice the injured person's eyes moving rapidly back and forth. This may be a sign of vertigo.  You have convulsions or faint.  You have severe, persistent headaches that are not relieved by medicine.  You cannot use your arms or legs normally.  Your pupils change size.  You have clear or bloody discharge from the nose or ears.  Your problems are getting worse, not better. MAKE SURE YOU:  Understand these instructions.  Will watch your condition.  Will get help right away if you are not doing well or get worse. Document Released: 09/04/2001 Document Revised: 01/03/2013 Document Reviewed: 10/01/2010 [facility] Patient Information 2014 [place].

## 2013-06-08 NOTE — Telephone Encounter (Signed)
Pt left v/m; pt was seen this afternoon and when pt went to [facility] and picked up simvastatin rx that was refilled today but was told by pharmacy waiting on OK for effexor; pt has one pill left and request refill done today.pt request cb.

## 2013-06-08 NOTE — Assessment & Plan Note (Signed)
Refer to cardiology for possible Holter monitor, ? Carotid dopplers. Reassured by EKG but could still have EP or arrythmia not detected on EKG. Continue statin. Orders Placed This Encounter  Procedures  . Ambulatory referral to Cardiology

## 2013-06-21 ENCOUNTER — Encounter: Payer: Self-pay | Admitting: Cardiovascular Disease

## 2013-06-21 ENCOUNTER — Ambulatory Visit: Payer: Self-pay | Admitting: Cardiovascular Disease

## 2013-06-21 ENCOUNTER — Ambulatory Visit (INDEPENDENT_AMBULATORY_CARE_PROVIDER_SITE_OTHER): Payer: Federal, State, Local not specified - PPO | Admitting: Cardiovascular Disease

## 2013-06-21 VITALS — BP 110/78 | HR 85 | Ht 68.0 in | Wt 204.5 lb

## 2013-06-21 DIAGNOSIS — R000 Tachycardia, unspecified: Secondary | ICD-10-CM

## 2013-06-21 DIAGNOSIS — R0602 Shortness of breath: Secondary | ICD-10-CM

## 2013-06-21 DIAGNOSIS — F0781 Postconcussional syndrome: Secondary | ICD-10-CM

## 2013-06-21 DIAGNOSIS — R55 Syncope and collapse: Secondary | ICD-10-CM

## 2013-06-21 DIAGNOSIS — R030 Elevated blood-pressure reading, without diagnosis of hypertension: Secondary | ICD-10-CM

## 2013-06-21 DIAGNOSIS — E785 Hyperlipidemia, unspecified: Secondary | ICD-10-CM

## 2013-06-21 DIAGNOSIS — E669 Obesity, unspecified: Secondary | ICD-10-CM

## 2013-06-21 NOTE — Patient Instructions (Addendum)
You are doing well. No medication changes were made.  We will order a holter monitor 48 hour for syncope, shortness of breath Please pick this up at the medical mall entrance of [facility] as soon as you leave the office  Your physician has requested that you have an echocardiogram for syncope. Echocardiography is a painless test that uses sound waves to create images of your heart. It provides your doctor with information about the size and shape of your heart and how well your heart's chambers and valves are working. This procedure takes approximately one hour. There are no restrictions for this procedure.  Please call us if you have new issues that need to be addressed before your next appt.

## 2013-06-21 NOTE — Assessment & Plan Note (Signed)
We have encouraged continued exercise, careful diet management in an effort to lose weight. 

## 2013-06-21 NOTE — Assessment & Plan Note (Signed)
Blood pressures today running 130s up to 140s. Diastolic 80 to 78M. Suggested she monitor her blood pressure at home

## 2013-06-21 NOTE — Assessment & Plan Note (Signed)
She reports chronic dizziness since her syncope episode. Tried to reassure her that this could be normal postconcussion syndrome

## 2013-06-21 NOTE — Assessment & Plan Note (Signed)
Encouraged her to stay on her simvastatin. As she feels better, encouraged weight loss and exercise

## 2013-06-21 NOTE — Progress Notes (Signed)
Patient ID: [name], female    DOB: 1960/11/04, 53 y.o.   MRN: [id number]  HPI Comments: [name] is a 53 year old woman with prior history of chest pain and shortness of breath, hyperlipidemia, borderline diabetes with prior stress tests dating 4 2006, cardiac catheterization in March 2006 showing no significant CAD, presenting after an episode of syncope.  Reports that 3 weeks ago early in the morning she was getting out of bed. She had acute onset of syncope with no warning. She found herself on the floor. She had workup in the emergency room including lab work, cardiac enzymes, EKG. No head CT scan was performed. Workup was essentially normal and she was discharged home.  In followup she continues to have dizziness since her syncope episode. This was felt to be from concussion.  She does report having some shortness of breath prior to the syncope, which may be slightly worse after her syncope episode. She denies any significant chest pain. Has not had any near syncope or other syncope episodes prior to her syncope episode and since then. She denies having leg edema, no significant orthostatic type symptoms.  EKG shows normal sinus rhythm with rate 85 beats a minute with no significant ST or T wave changes   Outpatient Encounter Prescriptions as of 06/21/2013  Medication Sig  . Aspirin-Salicylamide-Caffeine (BC HEADACHE POWDER PO) Take 1 packet by mouth daily as needed (pain).  . cyclobenzaprine (FLEXERIL) 5 MG tablet Take 5 mg by mouth 3 (three) times daily as needed for muscle spasms.  . fluticasone (FLONASE) 50 MCG/ACT nasal spray Place 2 sprays into both nostrils daily as needed for allergies or rhinitis.  Marland Kitchen NAPROXEN SODIUM PO Take by mouth as needed.  Marland Kitchen omeprazole (PRILOSEC OTC) 20 MG tablet Take 20 mg by mouth daily.  . simvastatin (ZOCOR) 20 MG tablet Take 20 mg by mouth every evening.  . venlafaxine (EFFEXOR) 37.5 MG tablet Take 1 tablet (37.5 mg total) by mouth every  evening.     Review of Systems  Constitutional: Negative.   HENT: Negative.   Eyes: Negative.   Respiratory: Negative.   Cardiovascular: Negative.   Gastrointestinal: Negative.   Endocrine: Negative.   Musculoskeletal: Negative.   Skin: Negative.   Allergic/Immunologic: Negative.   Neurological: Positive for dizziness and syncope.  Hematological: Negative.   Psychiatric/Behavioral: Negative.   All other systems reviewed and are negative.    BP 110/78  Pulse 85  Ht 5\' 8"  (1.727 m)  Wt 204 lb 8 oz (92.761 kg)  BMI 31.10 kg/m2  LMP 06/17/2011  Physical Exam  Nursing note and vitals reviewed. Constitutional: She is oriented to person, place, and time. She appears well-developed and well-nourished.  HENT:  Head: Normocephalic.  Nose: Nose normal.  Mouth/Throat: Oropharynx is clear and moist.  Eyes: Conjunctivae are normal. Pupils are equal, round, and reactive to light.  Neck: Normal range of motion. Neck supple. No JVD present.  Cardiovascular: Normal rate, regular rhythm, S1 normal, S2 normal, normal heart sounds and intact distal pulses.  Exam reveals no gallop and no friction rub.   No murmur heard. Pulmonary/Chest: Effort normal and breath sounds normal. No respiratory distress. She has no wheezes. She has no rales. She exhibits no tenderness.  Abdominal: Soft. Bowel sounds are normal. She exhibits no distension. There is no tenderness.  Musculoskeletal: Normal range of motion. She exhibits no edema and no tenderness.  Lymphadenopathy:    She has no cervical adenopathy.  Neurological: She is alert and  oriented to person, place, and time. Coordination normal.  Skin: Skin is warm and dry. No rash noted. No erythema.  Psychiatric: She has a normal mood and affect. Her behavior is normal. Judgment and thought content normal.    Assessment and Plan

## 2013-06-21 NOTE — Assessment & Plan Note (Addendum)
Hospital records were reviewed. Etiology of her syncope episode is unclear. As she had her episode getting out of bed, concerning for orthostasis. Blood pressure today well controlled with no significant orthostatic drop. Lab work did not suggest dehydration. Unable to exclude arrhythmia. Today Holter monitor has been ordered as well as echocardiogram to rule out structural heart disease. If these are normal, would monitor her for now. If she has additional episodes, she would require a 30 day monitor.

## 2013-06-22 ENCOUNTER — Other Ambulatory Visit: Payer: Self-pay

## 2013-06-22 ENCOUNTER — Other Ambulatory Visit (INDEPENDENT_AMBULATORY_CARE_PROVIDER_SITE_OTHER): Payer: Federal, State, Local not specified - PPO

## 2013-06-22 DIAGNOSIS — R0602 Shortness of breath: Secondary | ICD-10-CM

## 2013-06-22 DIAGNOSIS — R000 Tachycardia, unspecified: Secondary | ICD-10-CM

## 2013-06-22 DIAGNOSIS — R55 Syncope and collapse: Secondary | ICD-10-CM

## 2013-06-26 DIAGNOSIS — R55 Syncope and collapse: Secondary | ICD-10-CM

## 2013-06-27 ENCOUNTER — Telehealth: Payer: Self-pay

## 2013-06-27 ENCOUNTER — Other Ambulatory Visit: Payer: Self-pay

## 2013-06-27 DIAGNOSIS — R55 Syncope and collapse: Secondary | ICD-10-CM

## 2013-06-27 DIAGNOSIS — R000 Tachycardia, unspecified: Secondary | ICD-10-CM

## 2013-06-27 DIAGNOSIS — R0602 Shortness of breath: Secondary | ICD-10-CM

## 2013-06-27 NOTE — Telephone Encounter (Signed)
Reviewed results of 48 hr holter monitor: "NSR w/ rare APCs, PVCs.  Periods of sinus tachycardia." Pt reports she has not had any more syncopal episodes.   States that when she goes from squatting to standing position quickly, she feels lightheaded.  Advised pt to change positions slowly and monitor her symptoms.  Offered pt to set up a 30 day monitor, which she declines at this time.  Advised pt to monitor sx and call if they return. She is agreeable to this and will call w/ further questions or concerns.

## 2013-07-05 ENCOUNTER — Other Ambulatory Visit: Payer: Self-pay | Admitting: *Deleted

## 2013-07-05 MED ORDER — SIMVASTATIN 20 MG PO TABS: 20.0000 mg | ORAL_TABLET | Freq: Every evening | ORAL | Status: DC

## 2013-07-12 ENCOUNTER — Other Ambulatory Visit: Payer: Self-pay

## 2013-07-12 MED ORDER — VENLAFAXINE HCL 37.5 MG PO TABS: 37.5000 mg | ORAL_TABLET | Freq: Every evening | ORAL | Status: DC

## 2013-07-12 NOTE — Telephone Encounter (Signed)
Pt left v/m; pt thought would be seen before venlafaxine ran out that was refilled on 06/08/13 but pt is out of med and has scheduled appt to see Dr Deborra Medina on 07/16/13.Please advise.[facility].

## 2013-07-12 NOTE — Telephone Encounter (Signed)
Patient notified as instructed by telephone that refill was sent to [facility].

## 2013-07-16 ENCOUNTER — Encounter: Payer: Self-pay | Admitting: Family Medicine

## 2013-07-16 ENCOUNTER — Ambulatory Visit (INDEPENDENT_AMBULATORY_CARE_PROVIDER_SITE_OTHER): Payer: Federal, State, Local not specified - PPO | Admitting: Family Medicine

## 2013-07-16 VITALS — BP 128/80 | HR 81 | Temp 98.0°F | Wt 207.0 lb

## 2013-07-16 DIAGNOSIS — F329 Major depressive disorder, single episode, unspecified: Secondary | ICD-10-CM

## 2013-07-16 DIAGNOSIS — E785 Hyperlipidemia, unspecified: Secondary | ICD-10-CM

## 2013-07-16 DIAGNOSIS — F3289 Other specified depressive episodes: Secondary | ICD-10-CM

## 2013-07-16 DIAGNOSIS — R55 Syncope and collapse: Secondary | ICD-10-CM

## 2013-07-16 MED ORDER — VENLAFAXINE HCL 37.5 MG PO TABS: 37.5000 mg | ORAL_TABLET | Freq: Every evening | ORAL | Status: DC

## 2013-07-16 NOTE — Patient Instructions (Signed)
Good to see you. Come back for fasting labs at your convenience.

## 2013-07-16 NOTE — Assessment & Plan Note (Signed)
Well controlled. Rx given to pt for 90 day supply of effexor with 3 refills.

## 2013-07-16 NOTE — Progress Notes (Signed)
Pre visit review using our clinic review tool, if applicable. No additional management support is needed unless otherwise documented below in the visit note. 

## 2013-07-16 NOTE — Assessment & Plan Note (Signed)
Continue Zocor. Due for labs. She will schedule fasting labs- orders entered.

## 2013-07-16 NOTE — Assessment & Plan Note (Signed)
No further episodes. Call or return to clinic prn if these symptoms worsen or fail to improve as anticipated.

## 2013-07-16 NOTE — Progress Notes (Signed)
Subjective:   Patient ID: Whitney [name], female    DOB: 10-May-1960, 53 y.o.   MRN: [id number]  TAIMA RADA is a pleasant 53 y.o. year old female who presents to clinic today with Follow-up  on 07/16/2013  HPI: Saw her last month for hospital follow up after syncopal episode.  Referred her to cardiology for event monitor.  Given 48 Holter monitor- results showed:  NSR w/ rare APCs, PVCs. Periods of sinus tachycardia.  Offered 30 day Holter monitor but she declined. No further syncopal episodes.  Depression- well controlled on Effexor 37.5 mg daily.  Feels mood is good.  Denies any symptoms of anxiety or depression.  HLD-  Due for labs but not fasting today. Lab Results  Component Value Date   CHOL 211* 10/09/2012   HDL 47.90 10/09/2012   LDLCALC 99 12/09/2009   LDLDIRECT 133.7 10/09/2012   TRIG 225.0* 10/09/2012   CHOLHDL 4 10/09/2012   Patient Active Problem List   Diagnosis Date Noted  . Post concussion syndrome 06/08/2013  . Syncope 06/08/2013  . OBESITY 07/08/2009  . OTHER SPECIFIED ANEMIAS 02/28/2009  . BARRETTS ESOPHAGUS 10/16/2008  . HIATAL HERNIA 10/16/2008  . HYPERLIPIDEMIA 10/09/2008  . CONSTIPATION 10/09/2008  . ESOPHAGITIS, HX OF 10/09/2008  . DYSPHAGIA UNSPECIFIED 09/19/2008  . HERNIATED DISC 08/07/2008  . 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 ADD 07/24/2007  . CHOLELITHIASIS 05/26/2007  . ELEVATED BLOOD PRESSURE WITHOUT DIAGNOSIS OF HYPERTENSION 03/10/2007  . G E R D 09/27/2006  . HYPOGLYCEMIA 08/31/2006  . DEPRESSION 08/31/2006  . ANXIETY 08/17/2006  . HYPERGLYCEMIA 08/17/2006  . HEMORRHOIDS, INTERNAL 02/06/2002  . DIVERTICULAR DISEASE 02/06/2002   Past Medical History  Diagnosis Date  . Personal history of other diseases of digestive system   . Other and unspecified hyperlipidemia   . Internal hemorrhoids without mention of complication   . Diverticulosis of colon (without mention of hemorrhage)   . Unspecified constipation   . Dysphagia,  unspecified(787.20)   . Congenital anomalies of foot, not elsewhere classified   . Displacement of intervertebral disc, site unspecified, without myelopathy   . Other malaise and fatigue   . Chest pain, unspecified   . Attention deficit disorder without mention of hyperactivity   . Calculus of gallbladder without mention of cholecystitis or obstruction   . Abdominal pain, right upper quadrant   . Elevated blood pressure reading without diagnosis of hypertension   . Esophageal reflux   . Depressive disorder, not elsewhere classified   . Anxiety state, unspecified   . Hypoglycemia, unspecified   . Other abnormal blood chemistry   . High cholesterol    Past Surgical History  Procedure Laterality Date  . Cholecystectomy  06/16/2007    Dr. Hulen Skains  . Colonoscopy  02/06/2002    diverticula and internal hemorroids  . Cesarean section    . Cystectomy      left breast  . Laminectomy    . Hernia repair    . Cardiac catheterization      [facility]   History  Substance Use Topics  . Smoking status: Former Research scientist (life sciences)  . Smokeless tobacco: Never Used  . Alcohol Use: Yes     Comment: occassional   Family History  Problem Relation Age of Onset  . Colon polyps Mother   . Hypertension Father   . Diabetes Father   . Heart disease Father   . Colon polyps Maternal Grandmother   . Heart attack Maternal Grandmother   . Cancer  Maternal Grandfather     prostate  . Cancer Paternal Grandmother     colon  . Heart attack Paternal Grandfather    Allergies  Allergen Reactions  . Sertraline Hcl     REACTION: Headache/ vision changes   Current Outpatient Prescriptions on File Prior to Visit  Medication Sig Dispense Refill  . Aspirin-Salicylamide-Caffeine (BC HEADACHE POWDER PO) Take 1 packet by mouth daily as needed (pain).      . cyclobenzaprine (FLEXERIL) 5 MG tablet Take 5 mg by mouth 3 (three) times daily as needed for muscle spasms.      . fluticasone (FLONASE) 50 MCG/ACT nasal spray Place 2  sprays into both nostrils daily as needed for allergies or rhinitis.      Marland Kitchen NAPROXEN SODIUM PO Take by mouth as needed.      Marland Kitchen omeprazole (PRILOSEC OTC) 20 MG tablet Take 20 mg by mouth daily.      . simvastatin (ZOCOR) 20 MG tablet Take 1 tablet (20 mg total) by mouth every evening.  30 tablet  3   No current facility-administered medications on file prior to visit.   The PMH, PSH, Social History, Family History, Medications, and allergies have been reviewed in [facility], and have been updated if relevant.     Review of Systems    See HPI No dizziness No HA No blurred vision  Objective:    BP 128/80  Pulse 81  Temp(Src) 98 F (36.7 C) (Oral)  Wt 207 lb (93.895 kg)  SpO2 97%  LMP 06/17/2011   Physical Exam  Nursing note and vitals reviewed. Constitutional: She is oriented to person, place, and time. She appears well-developed and well-nourished. No distress.  HENT:  Head: Normocephalic.  Musculoskeletal: Normal range of motion.  Neurological: She is alert and oriented to person, place, and time.  Skin: Skin is warm and dry.  Psychiatric: She has a normal mood and affect. Her behavior is normal. Judgment and thought content normal.          Assessment & Plan:   No diagnosis found. No Follow-up on file.

## 2013-08-12 ENCOUNTER — Other Ambulatory Visit: Payer: Self-pay | Admitting: Internal Medicine

## 2013-08-12 ENCOUNTER — Other Ambulatory Visit: Payer: Self-pay | Admitting: Family Medicine

## 2013-08-29 ENCOUNTER — Encounter: Payer: Self-pay | Admitting: Internal Medicine

## 2013-10-01 ENCOUNTER — Encounter: Payer: Self-pay | Admitting: *Deleted

## 2013-10-01 ENCOUNTER — Telehealth: Payer: Self-pay | Admitting: Internal Medicine

## 2013-10-01 NOTE — Telephone Encounter (Signed)
Spoke with patient and she is scheduled for recall EGD on 10/19/13. She reports she is having worsening reflux, clearing of throat and has problems with food going down for the last 3 months. She has tried Nexium without relief. She is also reporting blood in stool off and on for one year. She is concerned due to recent family members having cancer. Scheduled OV with Dr. Olevia Perches on 10/02/13 at 9:15 AM.

## 2013-10-02 ENCOUNTER — Encounter: Payer: Self-pay | Admitting: Internal Medicine

## 2013-10-02 ENCOUNTER — Ambulatory Visit (INDEPENDENT_AMBULATORY_CARE_PROVIDER_SITE_OTHER): Payer: Federal, State, Local not specified - PPO | Admitting: Internal Medicine

## 2013-10-02 VITALS — BP 120/88 | HR 96 | Ht 66.0 in | Wt 205.4 lb

## 2013-10-02 DIAGNOSIS — K648 Other hemorrhoids: Secondary | ICD-10-CM

## 2013-10-02 DIAGNOSIS — K2270 Barrett's esophagus without dysplasia: Secondary | ICD-10-CM

## 2013-10-02 DIAGNOSIS — K625 Hemorrhage of anus and rectum: Secondary | ICD-10-CM

## 2013-10-02 MED ORDER — ESOMEPRAZOLE MAGNESIUM 40 MG PO CPDR: 40.0000 mg | DELAYED_RELEASE_CAPSULE | Freq: Two times a day (BID) | ORAL | Status: DC

## 2013-10-02 MED ORDER — MOVIPREP 100 G PO SOLR: 1.0000 | Freq: Once | ORAL | Status: DC

## 2013-10-02 NOTE — Patient Instructions (Addendum)
You have been scheduled for an endoscopy and colonoscopy. Please follow the written instructions given to you at your visit today. Please pick up your prep at the pharmacy within the next 1-3 days. If you use inhalers (even only as needed), please bring them with you on the day of your procedure. Your physician has requested that you go to www.startemmi.com and enter the access code given to you at your visit today. This web site gives a general overview about your procedure. However, you should still follow specific instructions given to you by our office regarding your preparation for the procedure.  We have sent the following medications to your pharmacy for you to pick up at your convenience: Nexium twice daily (in place of prilosec)  Please purchase the following medications over the counter and take as directed: Preparation H every night  CC: Dr Deborra Medina

## 2013-10-02 NOTE — Progress Notes (Signed)
Whitney [name] 22-Aug-1960 [id number]  Note: This dictation was prepared with Dragon digital system. Any transcriptional errors that result from this procedure are unintentional.   History of Present Illness:  This is a 53 year old white female with severe gastroesophageal reflux previously evaluated with esophageal manometry which showed a normal lower esophageal sphincter, 6/8 peristaltic contractions, one simultaneous contraction and one retrograde contraction. She is having dysphagia to solids and liquids. Her last upper endoscopy in July 2010 showed Barrett's esophagus as well as a 3 cm hiatal hernia with mild esophagitis. She has been complaining of burning substernally at night as well as during the day and some dysphagia. She tried Prilosec 20 mg but did not see any difference. She is currently on Gaviscon and TUMS. A separate problem has been bright red blood per rectum in small amounts. She has known hemorrhoids. Her last colonoscopy in July 2010 showed mild diverticulosis. Her grandmother had colon cancer.    Past Medical History  Diagnosis Date  . HLD (hyperlipidemia)   . Internal hemorrhoids without mention of complication   . Diverticulosis   . Congenital anomalies of foot, not elsewhere classified   . Displacement of intervertebral disc, site unspecified, without myelopathy   . Attention deficit disorder without mention of hyperactivity   . Gallstones   . Elevated blood pressure reading without diagnosis of hypertension   . Esophageal reflux   . Depression   . Anxiety   . Hypoglycemia, unspecified   . Other abnormal blood chemistry   . Hiatal hernia   . Barrett's esophagus     Past Surgical History  Procedure Laterality Date  . Cholecystectomy  06/16/2007    Dr. Hulen Skains  . Colonoscopy  02/06/2002    diverticula and internal hemorroids  . Cesarean section    . Breast cyst excision Left   . Lumbar laminectomy      L5-S1  . Hiatal hernia repair    . Cardiac  catheterization      [facility]    Allergies  Allergen Reactions  . Sertraline Hcl     REACTION: Headache/ vision changes  . Zoloft [Sertraline Hcl]     Family history and social history have been reviewed.  Review of Systems: Painless hematochezia. Denies abdominal pain  The remainder of the 10 point ROS is negative except as outlined in the H&P  Physical Exam: General Appearance Well developed, in no distress mildly overweight Eyes  Non icteric  HEENT  Non traumatic, normocephalic  Mouth No lesion, tongue papillated, no cheilosis Neck Supple without adenopathy, thyroid not enlarged, no carotid bruits, no JVD Lungs Clear to auscultation bilaterally COR Normal S1, normal S2, regular rhythm, no murmur, quiet precordium Abdomen soft nontender with normoactive bowel sounds. Liver edge at costal margin. Rectal and anoscopic exam reveals normal perianal area. Normal rectal sphincter tone. One first-grade hemorrhoid internally. Blue and swollen. No bleeding. No thrombosis. Stool is Hemoccult negative Extremities  No pedal edema Skin No lesions Neurological Alert and oriented x 3 Psychological Normal mood and affect  Assessment and Plan:   Problem #1 Barrett's esophagus refractory to PPIs. Patient is due for a recall endoscopy. We will schedule this. She may need esophageal dilation. We will start her on Nexium 40 mg twice a day. We have also discussed antireflux measures.  Problem #2 Painless rectal bleeding. Anoscopic exam reveals a first-grade internal hemorrhoid. She has a family history of colon cancer in a grangparent  And in  a cousin with stage IV colon cancer. We  will proceed with a colonoscopy.  We have discussed the prep, sedation and the procedure.    Whitney [name] 10/02/2013

## 2013-10-03 ENCOUNTER — Encounter: Payer: Self-pay | Admitting: Internal Medicine

## 2013-10-19 ENCOUNTER — Encounter: Payer: Federal, State, Local not specified - PPO | Admitting: Internal Medicine

## 2013-11-21 ENCOUNTER — Ambulatory Visit (AMBULATORY_SURGERY_CENTER): Payer: Federal, State, Local not specified - PPO | Admitting: Internal Medicine

## 2013-11-21 ENCOUNTER — Other Ambulatory Visit: Payer: Self-pay | Admitting: Internal Medicine

## 2013-11-21 ENCOUNTER — Encounter: Payer: Self-pay | Admitting: Internal Medicine

## 2013-11-21 VITALS — BP 136/74 | HR 69 | Temp 96.9°F | Resp 26 | Ht 66.0 in | Wt 205.0 lb

## 2013-11-21 DIAGNOSIS — D126 Benign neoplasm of colon, unspecified: Secondary | ICD-10-CM

## 2013-11-21 DIAGNOSIS — K219 Gastro-esophageal reflux disease without esophagitis: Secondary | ICD-10-CM

## 2013-11-21 DIAGNOSIS — K2270 Barrett's esophagus without dysplasia: Secondary | ICD-10-CM

## 2013-11-21 DIAGNOSIS — K625 Hemorrhage of anus and rectum: Secondary | ICD-10-CM

## 2013-11-21 MED ORDER — SODIUM CHLORIDE 0.9 % IV SOLN: 500.0000 mL | INTRAVENOUS | Status: DC

## 2013-11-21 MED ORDER — ESOMEPRAZOLE MAGNESIUM 40 MG PO CPDR: 40.0000 mg | DELAYED_RELEASE_CAPSULE | Freq: Two times a day (BID) | ORAL | Status: DC

## 2013-11-21 NOTE — Op Note (Signed)
[place]  [address] [place], [address]   COLONOSCOPY PROCEDURE REPORT  PATIENT: Whitney [name], Whitney [name]  MR#: [id number] BIRTHDATE: 08/16/1960 , 85  yrs. old GENDER: Female ENDOSCOPIST: Lafayette Dragon, MD REFERRED MV:[facility] Aron, M.D. PROCEDURE DATE:  11/21/2013 PROCEDURE:   Colonoscopy, screening First Screening Colonoscopy - Avg.  risk and is 50 yrs.  old or older - No.  Prior Negative Screening - Now for repeat screening. 10 or more years since last screening Prior Negative Screening - Now for repeat screening.  Above average risk Prior Negative Screening - Now for repeat screening.  Other: See Comments  History of Adenoma - Now for follow-up colonoscopy & has been > or = to 3 yrs.  N/A  Polyps Removed Today? Yes. ASA CLASS:   Class II INDICATIONS:Rectal Bleeding and Last colonoscopy July 2000 and.Marland Kitchen Positive family history of colon cancer in maternal cousin. Low-volume rectal bleeding. MEDICATIONS: MAC sedation, administered by CRNA and propofol (Diprivan) 400mg  IV  DESCRIPTION OF PROCEDURE:   After the risks benefits and alternatives of the procedure were thoroughly explained, informed consent was obtained.  A digital rectal exam revealed no abnormalities of the rectum.   The LB [id number] and LB [id number]  endoscope was introduced through the anus and advanced to the cecum, which was identified by both the appendix and ileocecal valve. No adverse events experienced.   The quality of the prep was good, using MoviPrep  The instrument was then slowly withdrawn as the colon was fully examined.      COLON FINDINGS: A sessile polyp ranging between 3-46mm in size was found in the sigmoid colon.  A polypectomy was performed with cold forceps.  The resection was complete and the polyp tissue was completely retrieved.  Retroflexed views revealed no abnormalities. The time to cecum=31 minutes 21 seconds.  Withdrawal time=6 minutes 05 seconds.  The  scope was withdrawn and the procedure completed. COMPLICATIONS: There were no complications.  ENDOSCOPIC IMPRESSION: 1.Sessile polyp ranging between 3-65mm in size was found in the sigmoid colon; polypectomy was performed with cold forceps 2. long redundant colon. Difficult exam. 3. low volume hematochezia is likely from a rectal source  RECOMMENDATIONS: 1.  Await biopsy results 2.  high fiber diet 3. preparation H. when necessary anal /rectal bleeding  eSigned:  Lafayette Dragon, MD 11/21/2013 4:07 PM   cc:   PATIENT NAME:  Whitney [name] MR#: [id number]

## 2013-11-21 NOTE — Patient Instructions (Signed)
Discharge instructions given with verbal understanding. Handout on polyps and biopsies taken. Resume previous medications. YOU HAD AN ENDOSCOPIC PROCEDURE TODAY AT [facility]: Refer to the procedure report that was given to you for any specific questions about what was found during the examination.  If the procedure report does not answer your questions, please call your gastroenterologist to clarify.  If you requested that your care partner not be given the details of your procedure findings, then the procedure report has been included in a sealed envelope for you to review at your convenience later.  YOU SHOULD EXPECT: Some feelings of bloating in the abdomen. Passage of more gas than usual.  Walking can help get rid of the air that was put into your GI tract during the procedure and reduce the bloating. If you had a lower endoscopy (such as a colonoscopy or flexible sigmoidoscopy) you may notice spotting of blood in your stool or on the toilet paper. If you underwent a bowel prep for your procedure, then you may not have a normal bowel movement for a few days.  DIET: Your first meal following the procedure should be a light meal and then it is ok to progress to your normal diet.  A half-sandwich or bowl of soup is an example of a good first meal.  Heavy or fried foods are harder to digest and may make you feel nauseous or bloated.  Likewise meals heavy in dairy and vegetables can cause extra gas to form and this can also increase the bloating.  Drink plenty of fluids but you should avoid alcoholic beverages for 24 hours.  ACTIVITY: Your care partner should take you home directly after the procedure.  You should plan to take it easy, moving slowly for the rest of the day.  You can resume normal activity the day after the procedure however you should NOT DRIVE or use heavy machinery for 24 hours (because of the sedation medicines used during the test).    SYMPTOMS TO REPORT  IMMEDIATELY: A gastroenterologist can be reached at any hour.  During normal business hours, 8:30 AM to 5:00 PM Monday through Friday, call [phone].  After hours and on weekends, please call the GI answering service at [phone] who will take a message and have the physician on call contact you.   Following lower endoscopy (colonoscopy or flexible sigmoidoscopy):  Excessive amounts of blood in the stool  Significant tenderness or worsening of abdominal pains  Swelling of the abdomen that is new, acute  Fever of 100F or higher  Following upper endoscopy (EGD)  Vomiting of blood or coffee ground material  New chest pain or pain under the shoulder blades  Painful or persistently difficult swallowing  New shortness of breath  Fever of 100F or higher  Black, tarry-looking stools  FOLLOW UP: If any biopsies were taken you will be contacted by phone or by letter within the next 1-3 weeks.  Call your gastroenterologist if you have not heard about the biopsies in 3 weeks.  Our staff will call the home number listed on your records the next business day following your procedure to check on you and address any questions or concerns that you may have at that time regarding the information given to you following your procedure. This is a courtesy call and so if there is no answer at the home number and we have not heard from you through the emergency physician on call, we will assume that you  have returned to your regular daily activities without incident.  SIGNATURES/CONFIDENTIALITY: You and/or your care partner have signed paperwork which will be entered into your electronic medical record.  These signatures attest to the fact that that the information above on your After Visit Summary has been reviewed and is understood.  Full responsibility of the confidentiality of this discharge information lies with you and/or your care-partner.

## 2013-11-21 NOTE — Progress Notes (Signed)
Report to PACU, RN, vss, BBS= Clear.  

## 2013-11-21 NOTE — Op Note (Signed)
[place]  [address]. [place], [address]   ENDOSCOPY PROCEDURE REPORT  PATIENT: Whitney [name], Whitney [name]  MR#: [id number] BIRTHDATE: 06/12/1960 , 33  yrs. old GENDER: Female ENDOSCOPIST: Lafayette Dragon, MD REFERRED BY:  Arnette Norris, M.D. PROCEDURE DATE:  11/21/2013 PROCEDURE:  EGD w/ biopsy ASA CLASS:     Class II INDICATIONS:  history of Barrett's esophagus.   history of gastroesophageal reflux.  Upper endoscopy in July 2000 and showed Barrett's esophagus and 3 cm hiatal hernia. MEDICATIONS: MAC sedation, administered by CRNA and propofol (Diprivan) 200mg  IV TOPICAL ANESTHETIC: none  DESCRIPTION OF PROCEDURE: After the risks benefits and alternatives of the procedure were thoroughly explained, informed consent was obtained.  The LB [id number] endoscope was introduced through the mouth and advanced to the second portion of the duodenum. Without limitations.  The instrument was slowly withdrawn as the mucosa was fully examined.      [Esophagus: proximal and mid-esophageal mucosa was normal. The distal esophageal lumen was angulated and slightly irregular. Biopsies were taken from the GE junction to followup on Barrett's esophagus. There was a small reducible hiatal hernia. There was no stricture  Stomach: gastric folds in gastric antrum were unremarkable. Retroflexion of the endoscope revealed normal fundus and cardia gastric outlet was normal  Duodenum: duodenal bulb and descending duodenum was normal The scope was then withdrawn from the patient and the procedure completed.  COMPLICATIONS: There were no complications. ENDOSCOPIC IMPRESSION:  1. small reducible hernia 2. is from squamocolumnar Junction to followup on Barrett's esophagus   RECOMMENDATIONS: 1.  Await pathology results 2.  Anti-reflux regimen to be follow 3.  Continue PPI- Nexiem  40 mg/day  REPEAT EXAM: for EGD pending biopsy results.  eSigned:  Lafayette Dragon, MD 11/21/2013  4:00 PM   CC:  PATIENT NAME:  Whitney [name], Whitney [name] MR#: [id number]

## 2013-11-21 NOTE — Progress Notes (Signed)
Called to room to assist during endoscopic procedure.  Patient ID and intended procedure confirmed with present staff. Received instructions for my participation in the procedure from the performing physician.  

## 2013-11-21 NOTE — Progress Notes (Signed)
Once patient in position for Colonoscopy, an old bruise of left calf, 4" x 3", was noted. Small laceration in center of bruise.

## 2013-11-22 ENCOUNTER — Telehealth: Payer: Self-pay | Admitting: *Deleted

## 2013-11-22 NOTE — Telephone Encounter (Signed)
  Follow up Call-  Call back number 11/21/2013  Post procedure Call Back phone  # [phone]  Permission to leave phone message Yes     Patient questions:  Do you have a fever, pain , or abdominal swelling? No. Pain Score  0 *  Have you tolerated food without any problems? Yes.    Have you been able to return to your normal activities? Yes.    Do you have any questions about your discharge instructions: Diet   No. Medications  No. Follow up visit  No.  Do you have questions or concerns about your Care? No.  Actions: * If pain score is 4 or above: No action needed, pain <4.  Pt states that she has a "sore throat".  Advised her that she could take Tylenol as needed.

## 2013-11-27 ENCOUNTER — Encounter: Payer: Self-pay | Admitting: Internal Medicine

## 2013-11-28 ENCOUNTER — Encounter: Payer: Self-pay | Admitting: *Deleted

## 2013-11-30 ENCOUNTER — Other Ambulatory Visit: Payer: Self-pay | Admitting: *Deleted

## 2013-11-30 MED ORDER — SIMVASTATIN 20 MG PO TABS: 20.0000 mg | ORAL_TABLET | Freq: Every evening | ORAL | Status: DC

## 2014-01-22 ENCOUNTER — Ambulatory Visit (INDEPENDENT_AMBULATORY_CARE_PROVIDER_SITE_OTHER): Payer: Federal, State, Local not specified - PPO | Admitting: Internal Medicine

## 2014-01-22 ENCOUNTER — Encounter: Payer: Self-pay | Admitting: Internal Medicine

## 2014-01-22 VITALS — BP 118/76 | HR 87 | Temp 98.6°F | Wt 204.0 lb

## 2014-01-22 DIAGNOSIS — J069 Acute upper respiratory infection, unspecified: Secondary | ICD-10-CM

## 2014-01-22 MED ORDER — AZITHROMYCIN 250 MG PO TABS: ORAL_TABLET | ORAL | Status: DC

## 2014-01-22 MED ORDER — HYDROCODONE-HOMATROPINE 5-1.5 MG/5ML PO SYRP: 5.0000 mL | ORAL_SOLUTION | Freq: Three times a day (TID) | ORAL | Status: DC | PRN

## 2014-01-22 NOTE — Progress Notes (Signed)
HPI  Pt presents to the clinic today with c/o cough, chest congestion and sore throat. She reports this started 10 days ago. The cough is productive of green/yellow mucous. She denies fever but has felt fatigued. She has tried Mucinex and Naproxen without much relief. She has no history of allergies or breathing problems. She has had sick contacts. She does not smoke.  Review of Systems      Past Medical History  Diagnosis Date  . HLD (hyperlipidemia)   . Internal hemorrhoids without mention of complication   . Diverticulosis   . Congenital anomalies of foot, not elsewhere classified   . Displacement of intervertebral disc, site unspecified, without myelopathy   . Attention deficit disorder without mention of hyperactivity   . Gallstones   . Elevated blood pressure reading without diagnosis of hypertension   . Esophageal reflux   . Depression   . Anxiety   . Hypoglycemia, unspecified   . Other abnormal blood chemistry   . Hiatal hernia   . Barrett's esophagus     Family History  Problem Relation Age of Onset  . Colon polyps Mother   . Hypertension Father   . Diabetes Father   . Heart disease Father   . Colon polyps Maternal Grandmother   . Heart attack Maternal Grandmother   . Prostate cancer Maternal Grandfather   . Heart attack Paternal Grandfather   . Colon cancer Paternal Grandmother     History   Social History  . Marital Status: Married    Spouse Name: N/A    Number of Children: 2  . Years of Education: N/A   Occupational History  . 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 Smoking status: Former Smoker    Types: Cigarettes  . Smokeless tobacco: Never Used     Comment: 2 years as a teenager  . Alcohol Use: Yes     Comment: occassional  . Drug Use: No  . Sexual Activity: Yes   Other Topics Concern  . Not on file   Social History Narrative  . No narrative on file    Allergies  Allergen Reactions  . Sertraline Hcl    REACTION: Headache/ vision changes  . Zoloft [Sertraline Hcl]      Constitutional: Positive fatigue. Denies headache, fever or abrupt weight changes.  HEENT:  Positive sore throat. Denies eye redness, eye pain, pressure behind the eyes, facial pain, nasal congestion, ear pain, ringing in the ears, wax buildup, runny nose or bloody nose. Respiratory: Positive cough. Denies difficulty breathing or shortness of breath.  Cardiovascular: Denies chest pain, chest tightness, palpitations or swelling in the hands or feet.   No other specific complaints in a complete review of systems (except as listed in HPI above).  Objective:   BP 118/76  Pulse 87  Temp(Src) 98.6 F (37 C) (Oral)  Wt 204 lb (92.534 kg)  SpO2 98%  LMP 06/17/2011 Wt Readings from Last 3 Encounters:  01/22/14 204 lb (92.534 kg)  11/21/13 205 lb (92.987 kg)  10/02/13 205 lb 6 oz (93.157 kg)     General: Appears her stated age, ill appearing in NAD. HEENT: Head: normal shape and size;  Ears: Tm's gray and intact, normal light reflex; Nose: mucosa pink and moist, septum midline; Throat/Mouth: Teeth present, mucosa erythematous and moist, no exudate noted, no lesions or ulcerations noted.  Neck: Mild cervical lymphadenopathy. Neck supple, trachea midline.  Cardiovascular: Normal rate and rhythm. S1,S2 noted.  No  murmur, rubs or gallops noted.  Pulmonary/Chest: Normal effort and positive vesicular breath sounds. No respiratory distress. No wheezes, rales or ronchi noted.      Assessment & Plan:   Upper Respiratory Infection:  Get some rest and drink plenty of water Do salt water gargles for the sore throat eRx for Azithromax x 5 days eRx for Hycodan cough syrup  RTC as needed or if symptoms persist.

## 2014-01-22 NOTE — Patient Instructions (Addendum)
Upper Respiratory Infection, Adult An upper respiratory infection (URI) is also sometimes known as the common cold. The upper respiratory tract includes the nose, sinuses, throat, trachea, and bronchi. Bronchi are the airways leading to the lungs. Most people improve within 1 week, but symptoms can last up to 2 weeks. A residual cough may last even longer.  CAUSES Many different viruses can infect the tissues lining the upper respiratory tract. The tissues become irritated and inflamed and often become very moist. Mucus production is also common. A cold is contagious. You can easily spread the virus to others by oral contact. This includes kissing, sharing a glass, coughing, or sneezing. Touching your mouth or nose and then touching a surface, which is then touched by another person, can also spread the virus. SYMPTOMS  Symptoms typically develop 1 to 3 days after you come in contact with a cold virus. Symptoms vary from person to person. They may include:  Runny nose.  Sneezing.  Nasal congestion.  Sinus irritation.  Sore throat.  Loss of voice (laryngitis).  Cough.  Fatigue.  Muscle aches.  Loss of appetite.  Headache.  Low-grade fever. DIAGNOSIS  You might diagnose your own cold based on familiar symptoms, since most people get a cold 2 to 3 times a year. Your caregiver can confirm this based on your exam. Most importantly, your caregiver can check that your symptoms are not due to another disease such as strep throat, sinusitis, pneumonia, asthma, or epiglottitis. Blood tests, throat tests, and X-rays are not necessary to diagnose a common cold, but they may sometimes be helpful in excluding other more serious diseases. Your caregiver will decide if any further tests are required. RISKS AND COMPLICATIONS  You may be at risk for a more severe case of the common cold if you smoke cigarettes, have chronic heart disease (such as heart failure) or lung disease (such as asthma), or if  you have a weakened immune system. The very young and very old are also at risk for more serious infections. Bacterial sinusitis, middle ear infections, and bacterial pneumonia can complicate the common cold. The common cold can worsen asthma and chronic obstructive pulmonary disease (COPD). Sometimes, these complications can require emergency medical care and may be life-threatening. PREVENTION  The best way to protect against getting a cold is to practice good hygiene. Avoid oral or hand contact with people with cold symptoms. Wash your hands often if contact occurs. There is no clear evidence that vitamin C, vitamin E, echinacea, or exercise reduces the chance of developing a cold. However, it is always recommended to get plenty of rest and practice good nutrition. TREATMENT  Treatment is directed at relieving symptoms. There is no cure. Antibiotics are not effective, because the infection is caused by a virus, not by bacteria. Treatment may include:  Increased fluid intake. Sports drinks offer valuable electrolytes, sugars, and fluids.  Breathing heated mist or steam (vaporizer or shower).  Eating chicken soup or other clear broths, and maintaining good nutrition.  Getting plenty of rest.  Using gargles or lozenges for comfort.  Controlling fevers with ibuprofen or acetaminophen as directed by your caregiver.  Increasing usage of your inhaler if you have asthma. Zinc gel and zinc lozenges, taken in the first 24 hours of the common cold, can shorten the duration and lessen the severity of symptoms. Pain medicines may help with fever, muscle aches, and throat pain. A variety of non-prescription medicines are available to treat congestion and runny nose. Your caregiver   can make recommendations and may suggest nasal or lung inhalers for other symptoms.  HOME CARE INSTRUCTIONS   Only take over-the-counter or prescription medicines for pain, discomfort, or fever as directed by your  caregiver.  Use a warm mist humidifier or inhale steam from a shower to increase air moisture. This may keep secretions moist and make it easier to breathe.  Drink enough water and fluids to keep your urine clear or pale yellow.  Rest as needed.  Return to work when your temperature has returned to normal or as your caregiver advises. You may need to stay home longer to avoid infecting others. You can also use a face mask and careful hand washing to prevent spread of the virus. SEEK MEDICAL CARE IF:   After the first few days, you feel you are getting worse rather than better.  You need your caregiver's advice about medicines to control symptoms.  You develop chills, worsening shortness of breath, or brown or red sputum. These may be signs of pneumonia.  You develop yellow or brown nasal discharge or pain in the face, especially when you bend forward. These may be signs of sinusitis.  You develop a fever, swollen neck glands, pain with swallowing, or white areas in the back of your throat. These may be signs of strep throat. SEEK IMMEDIATE MEDICAL CARE IF:   You have a fever.  You develop severe or persistent headache, ear pain, sinus pain, or chest pain.  You develop wheezing, a prolonged cough, cough up blood, or have a change in your usual mucus (if you have chronic lung disease).  You develop sore muscles or a stiff neck. Document Released: 09/08/2000 Document Revised: 06/07/2011 Document Reviewed: 06/20/2013 Exit[employer] Patient Information 2015 [employer]. This information is not intended to replace advice given to you by your health care provider. Make sure you discuss any questions you have with your health care provider.  

## 2014-01-22 NOTE — Progress Notes (Signed)
Pre visit review using our clinic review tool, if applicable. No additional management support is needed unless otherwise documented below in the visit note. 

## 2014-01-23 ENCOUNTER — Ambulatory Visit: Payer: Federal, State, Local not specified - PPO | Admitting: Family Medicine

## 2014-01-31 ENCOUNTER — Ambulatory Visit (INDEPENDENT_AMBULATORY_CARE_PROVIDER_SITE_OTHER): Payer: Federal, State, Local not specified - PPO | Admitting: Family Medicine

## 2014-01-31 ENCOUNTER — Encounter: Payer: Self-pay | Admitting: Family Medicine

## 2014-01-31 VITALS — BP 124/82 | HR 82 | Temp 97.9°F | Wt 208.8 lb

## 2014-01-31 DIAGNOSIS — F32A Depression, unspecified: Secondary | ICD-10-CM

## 2014-01-31 DIAGNOSIS — E785 Hyperlipidemia, unspecified: Secondary | ICD-10-CM

## 2014-01-31 DIAGNOSIS — Z23 Encounter for immunization: Secondary | ICD-10-CM

## 2014-01-31 DIAGNOSIS — F329 Major depressive disorder, single episode, unspecified: Secondary | ICD-10-CM

## 2014-01-31 LAB — CBC WITH DIFFERENTIAL/PLATELET
BASOS ABS: 0.1 10*3/uL (ref 0.0–0.1)
BASOS PCT: 0.7 % (ref 0.0–3.0)
EOS ABS: 0.1 10*3/uL (ref 0.0–0.7)
Eosinophils Relative: 1.6 % (ref 0.0–5.0)
HCT: 44.2 % (ref 36.0–46.0)
Hemoglobin: 14.3 g/dL (ref 12.0–15.0)
LYMPHS PCT: 23.6 % (ref 12.0–46.0)
Lymphs Abs: 2.1 10*3/uL (ref 0.7–4.0)
MCHC: 32.3 g/dL (ref 30.0–36.0)
MCV: 86.7 fl (ref 78.0–100.0)
Monocytes Absolute: 0.6 10*3/uL (ref 0.1–1.0)
Monocytes Relative: 6.5 % (ref 3.0–12.0)
NEUTROS PCT: 67.6 % (ref 43.0–77.0)
Neutro Abs: 6 10*3/uL (ref 1.4–7.7)
Platelets: 428 10*3/uL — ABNORMAL HIGH (ref 150.0–400.0)
RBC: 5.1 Mil/uL (ref 3.87–5.11)
RDW: 15.5 % (ref 11.5–15.5)
WBC: 8.9 10*3/uL (ref 4.0–10.5)

## 2014-01-31 LAB — COMPREHENSIVE METABOLIC PANEL
ALBUMIN: 3.2 g/dL — AB (ref 3.5–5.2)
ALK PHOS: 92 U/L (ref 39–117)
ALT: 35 U/L (ref 0–35)
AST: 27 U/L (ref 0–37)
BUN: 13 mg/dL (ref 6–23)
CHLORIDE: 103 meq/L (ref 96–112)
CO2: 27 mEq/L (ref 19–32)
CREATININE: 0.8 mg/dL (ref 0.4–1.2)
Calcium: 9.2 mg/dL (ref 8.4–10.5)
GFR: 80.33 mL/min (ref >60.00)
Glucose, Bld: 156 mg/dL — ABNORMAL HIGH (ref 70–99)
POTASSIUM: 4.1 meq/L (ref 3.5–5.1)
Sodium: 139 mEq/L (ref 135–145)
Total Bilirubin: 0.5 mg/dL (ref 0.2–1.2)
Total Protein: 7.1 g/dL (ref 6.0–8.3)

## 2014-01-31 LAB — LIPID PANEL
Cholesterol: 260 mg/dL — ABNORMAL HIGH (ref 0–200)
HDL: 46.6 mg/dL (ref >39.00)
LDL CALC: 178 mg/dL — AB (ref 0–99)
NONHDL: 213.4
TRIGLYCERIDES: 176 mg/dL — AB (ref 0.0–149.0)
Total CHOL/HDL Ratio: 6
VLDL: 35.2 mg/dL (ref 0.0–40.0)

## 2014-01-31 LAB — TSH: TSH: 1.08 u[IU]/mL (ref 0.35–4.50)

## 2014-01-31 NOTE — Patient Instructions (Signed)
Great to see you. We will call you with your lab results.  Have a happy holiday.

## 2014-01-31 NOTE — Progress Notes (Signed)
Pre visit review using our clinic review tool, if applicable. No additional management support is needed unless otherwise documented below in the visit note. 

## 2014-01-31 NOTE — Assessment & Plan Note (Signed)
Due for labs. Advised trying to take simvastatin every other night to see if it helps with myalgias.  She will keep me updated.

## 2014-01-31 NOTE — Assessment & Plan Note (Signed)
Well controlled on current rx. No changes made today. 

## 2014-01-31 NOTE — Progress Notes (Signed)
Subjective:    Patient ID: Whitney [name], female    DOB: [birth year], 53 y.o.   MRN: [id number]  HPI  Depression- on Effexor 37.5 mg twice daily daily. Feels it is working well.  Denies any anxiety or depression.  HLD- was on Zocor 20  mg daily but stopped taking it due to muscle aches.   Lab Results  Component Value Date   CHOL 211* 10/09/2012   HDL 47.90 10/09/2012   LDLCALC 99 12/09/2009   LDLDIRECT 133.7 10/09/2012   TRIG 225.0* 10/09/2012   CHOLHDL 4 10/09/2012   Lab Results  Component Value Date   TSH 1.14 08/07/2008     Patient Active Problem List   Diagnosis Date Noted  . Post concussion syndrome 06/08/2013  . Syncope 06/08/2013  . OBESITY 07/08/2009  . OTHER SPECIFIED ANEMIAS 02/28/2009  . BARRETTS ESOPHAGUS 10/16/2008  . HIATAL HERNIA 10/16/2008  . HYPERLIPIDEMIA 10/09/2008  . CONSTIPATION 10/09/2008  . ESOPHAGITIS, HX OF 10/09/2008  . DYSPHAGIA UNSPECIFIED 09/19/2008  . HERNIATED DISC 08/07/2008  . 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 ADD 07/24/2007  . CHOLELITHIASIS 05/26/2007  . ELEVATED BLOOD PRESSURE WITHOUT DIAGNOSIS OF HYPERTENSION 03/10/2007  . G E R D 09/27/2006  . HYPOGLYCEMIA 08/31/2006  . DEPRESSION 08/31/2006  . ANXIETY 08/17/2006  . HYPERGLYCEMIA 08/17/2006  . HEMORRHOIDS, INTERNAL 02/06/2002  . DIVERTICULAR DISEASE 02/06/2002   Past Medical History  Diagnosis Date  . HLD (hyperlipidemia)   . Internal hemorrhoids without mention of complication   . Diverticulosis   . Congenital anomalies of foot, not elsewhere classified   . Displacement of intervertebral disc, site unspecified, without myelopathy   . Attention deficit disorder without mention of hyperactivity   . Gallstones   . Elevated blood pressure reading without diagnosis of hypertension   . Esophageal reflux   . Depression   . Anxiety   . Hypoglycemia, unspecified   . Other abnormal blood chemistry   . Hiatal hernia   . Barrett's esophagus    Past Surgical  History  Procedure Laterality Date  . Cholecystectomy  06/16/2007    Dr. Hulen Skains  . Colonoscopy  02/06/2002    diverticula and internal hemorroids  . Cesarean section    . Breast cyst excision Left   . Lumbar laminectomy      L5-S1  . Hiatal hernia repair    . Cardiac catheterization      [facility]   History  Substance Use Topics  . Smoking status: Former Smoker    Types: Cigarettes  . Smokeless tobacco: Never Used     Comment: 2 years as a teenager  . Alcohol Use: Yes     Comment: occassional   Family History  Problem Relation Age of Onset  . Colon polyps Mother   . Hypertension Father   . Diabetes Father   . Heart disease Father   . Colon polyps Maternal Grandmother   . Heart attack Maternal Grandmother   . Prostate cancer Maternal Grandfather   . Heart attack Paternal Grandfather   . Colon cancer Paternal Grandmother    Allergies  Allergen Reactions  . Sertraline Hcl     REACTION: Headache/ vision changes  . Zoloft [Sertraline Hcl]    Current Outpatient Prescriptions on File Prior to Visit  Medication Sig Dispense Refill  . Aspirin-Salicylamide-Caffeine (BC HEADACHE POWDER PO) Take 1 packet by mouth daily as needed (pain).    . BEE POLLEN PO Take by mouth.    . cyclobenzaprine (FLEXERIL) 5 MG  tablet Take 5 mg by mouth 3 (three) times daily as needed for muscle spasms.    Marland Kitchen esomeprazole (NEXIUM) 40 MG capsule Take 1 capsule (40 mg total) by mouth 2 (two) times daily. 60 capsule 6  . fluticasone (FLONASE) 50 MCG/ACT nasal spray Place 2 sprays into both nostrils daily as needed for allergies or rhinitis.    Marland Kitchen HYDROcodone-homatropine (HYCODAN) 5-1.5 MG/5ML syrup Take 5 mLs by mouth every 8 (eight) hours as needed for cough. 120 mL 0  . NAPROXEN SODIUM PO Take by mouth as needed.    . simvastatin (ZOCOR) 20 MG tablet Take 1 tablet (20 mg total) by mouth every evening. Please schedule appointments for fasting labs, and to see Dr for any refills. 90 tablet 0  . venlafaxine  (EFFEXOR) 37.5 MG tablet Take 1 tablet (37.5 mg total) by mouth every evening. 90 tablet 3   No current facility-administered medications on file prior to visit.   The PMH, PSH, Social History, Family History, Medications, and allergies have been reviewed in [facility], and have been updated if relevant.   Review of Systems    See HPI No CP No SOB No LE edema No HA No abdominal pain Objective:   Physical Exam BP 124/82 mmHg  Pulse 82  Temp(Src) 97.9 F (36.6 C) (Oral)  Wt 208 lb 12 oz (94.688 kg)  SpO2 93%  LMP 06/17/2011 Wt Readings from Last 3 Encounters:  01/31/14 208 lb 12 oz (94.688 kg)  01/22/14 204 lb (92.534 kg)  11/21/13 205 lb (92.987 kg)    General:  Well-developed,well-nourished,in no acute distress; alert,appropriate and cooperative throughout examination Head:  normocephalic and atraumatic.   Eyes:  vision grossly intact, pupils equal, pupils round, and pupils reactive to light.   Ears:  R ear normal and L ear normal.   Nose:  no external deformity.   Mouth:  good dentition.   Lungs:  Normal respiratory effort, chest expands symmetrically. Lungs are clear to auscultation, no crackles or wheezes. Heart:  Normal rate and regular rhythm. S1 and S2 normal without gallop, murmur, click, rub or other extra sounds. Psych:  Cognition and judgment appear intact. Alert and cooperative with normal attention span and concentration. No apparent delusions, illusions, hallucinations     Assessment & Plan:

## 2014-05-24 ENCOUNTER — Other Ambulatory Visit: Payer: Self-pay | Admitting: Family Medicine

## 2014-08-04 ENCOUNTER — Other Ambulatory Visit: Payer: Self-pay | Admitting: Family Medicine

## 2014-08-27 ENCOUNTER — Other Ambulatory Visit: Payer: Self-pay | Admitting: Family Medicine

## 2014-08-28 ENCOUNTER — Telehealth: Payer: Self-pay | Admitting: *Deleted

## 2014-08-28 NOTE — Telephone Encounter (Signed)
Spoke with patient about scheduling screening mammo. She said she had one in 2015 and wasn't due for one until the end of the year. She couldn't remember where she had it done [facility] or [facility]). If [place]-it should be in her chart; if [place]-it should be able to be located under "Care Everywhere". I found one there from 2014, but none more recent. She was adamant that she had one done, but said to call her back later if none was located.

## 2014-09-24 ENCOUNTER — Other Ambulatory Visit: Payer: Self-pay | Admitting: Family Medicine

## 2014-10-08 ENCOUNTER — Other Ambulatory Visit: Payer: Self-pay | Admitting: Family Medicine

## 2014-10-08 DIAGNOSIS — Z0000 Encounter for general adult medical examination without abnormal findings: Secondary | ICD-10-CM | POA: Insufficient documentation

## 2014-10-08 DIAGNOSIS — E7800 Pure hypercholesterolemia, unspecified: Secondary | ICD-10-CM

## 2014-10-08 DIAGNOSIS — D6489 Other specified anemias: Secondary | ICD-10-CM

## 2014-10-08 DIAGNOSIS — Z01419 Encounter for gynecological examination (general) (routine) without abnormal findings: Secondary | ICD-10-CM | POA: Insufficient documentation

## 2014-10-09 ENCOUNTER — Other Ambulatory Visit (INDEPENDENT_AMBULATORY_CARE_PROVIDER_SITE_OTHER): Payer: Federal, State, Local not specified - PPO

## 2014-10-09 DIAGNOSIS — Z0000 Encounter for general adult medical examination without abnormal findings: Secondary | ICD-10-CM | POA: Diagnosis not present

## 2014-10-09 DIAGNOSIS — R7989 Other specified abnormal findings of blood chemistry: Secondary | ICD-10-CM

## 2014-10-09 DIAGNOSIS — E7800 Pure hypercholesterolemia, unspecified: Secondary | ICD-10-CM

## 2014-10-09 DIAGNOSIS — Z01419 Encounter for gynecological examination (general) (routine) without abnormal findings: Secondary | ICD-10-CM

## 2014-10-09 LAB — LIPID PANEL
Cholesterol: 213 mg/dL — ABNORMAL HIGH (ref 0–200)
HDL: 46.4 mg/dL (ref >39.00)
NonHDL: 166.6
TRIGLYCERIDES: 207 mg/dL — AB (ref 0.0–149.0)
Total CHOL/HDL Ratio: 5
VLDL: 41.4 mg/dL — ABNORMAL HIGH (ref 0.0–40.0)

## 2014-10-09 LAB — CBC WITH DIFFERENTIAL/PLATELET
BASOS PCT: 0.6 % (ref 0.0–3.0)
Basophils Absolute: 0 10*3/uL (ref 0.0–0.1)
EOS ABS: 0.2 10*3/uL (ref 0.0–0.7)
EOS PCT: 3 % (ref 0.0–5.0)
HCT: 32.4 % — ABNORMAL LOW (ref 36.0–46.0)
HEMOGLOBIN: 10.5 g/dL — AB (ref 12.0–15.0)
LYMPHS ABS: 1.8 10*3/uL (ref 0.7–4.0)
Lymphocytes Relative: 22.6 % (ref 12.0–46.0)
MCHC: 32.4 g/dL (ref 30.0–36.0)
MCV: 77.5 fl — AB (ref 78.0–100.0)
MONOS PCT: 7.4 % (ref 3.0–12.0)
Monocytes Absolute: 0.6 10*3/uL (ref 0.1–1.0)
Neutro Abs: 5.3 10*3/uL (ref 1.4–7.7)
Neutrophils Relative %: 66.4 % (ref 43.0–77.0)
Platelets: 457 10*3/uL — ABNORMAL HIGH (ref 150.0–400.0)
RBC: 4.18 Mil/uL (ref 3.87–5.11)
RDW: 16.6 % — ABNORMAL HIGH (ref 11.5–15.5)
WBC: 7.9 10*3/uL (ref 4.0–10.5)

## 2014-10-09 LAB — COMPREHENSIVE METABOLIC PANEL
ALBUMIN: 3.7 g/dL (ref 3.5–5.2)
ALK PHOS: 119 U/L — AB (ref 39–117)
ALT: 27 U/L (ref 0–35)
AST: 20 U/L (ref 0–37)
BILIRUBIN TOTAL: 0.3 mg/dL (ref 0.2–1.2)
BUN: 13 mg/dL (ref 6–23)
CALCIUM: 9.2 mg/dL (ref 8.4–10.5)
CO2: 26 meq/L (ref 19–32)
Chloride: 104 mEq/L (ref 96–112)
Creatinine, Ser: 0.77 mg/dL (ref 0.40–1.20)
GFR: 82.54 mL/min (ref >60.00)
Glucose, Bld: 241 mg/dL — ABNORMAL HIGH (ref 70–99)
Potassium: 4.4 mEq/L (ref 3.5–5.1)
Sodium: 138 mEq/L (ref 135–145)
Total Protein: 6.7 g/dL (ref 6.0–8.3)

## 2014-10-09 LAB — LDL CHOLESTEROL, DIRECT: LDL DIRECT: 118 mg/dL

## 2014-10-09 LAB — TSH: TSH: 1.36 u[IU]/mL (ref 0.35–4.50)

## 2014-10-09 LAB — HEMOGLOBIN A1C: HEMOGLOBIN A1C: 9 % — AB (ref 4.6–6.5)

## 2014-10-15 ENCOUNTER — Ambulatory Visit (INDEPENDENT_AMBULATORY_CARE_PROVIDER_SITE_OTHER): Payer: Federal, State, Local not specified - PPO | Admitting: Family Medicine

## 2014-10-15 ENCOUNTER — Encounter: Payer: Self-pay | Admitting: Gastroenterology

## 2014-10-15 ENCOUNTER — Encounter: Payer: Self-pay | Admitting: Family Medicine

## 2014-10-15 ENCOUNTER — Encounter: Payer: Self-pay | Admitting: Internal Medicine

## 2014-10-15 VITALS — BP 124/68 | HR 94 | Temp 98.4°F | Wt 211.8 lb

## 2014-10-15 DIAGNOSIS — D509 Iron deficiency anemia, unspecified: Secondary | ICD-10-CM

## 2014-10-15 DIAGNOSIS — E119 Type 2 diabetes mellitus without complications: Secondary | ICD-10-CM

## 2014-10-15 DIAGNOSIS — E785 Hyperlipidemia, unspecified: Secondary | ICD-10-CM | POA: Diagnosis not present

## 2014-10-15 DIAGNOSIS — E1169 Type 2 diabetes mellitus with other specified complication: Secondary | ICD-10-CM | POA: Insufficient documentation

## 2014-10-15 MED ORDER — GLUCOSE BLOOD VI STRP: ORAL_STRIP | Status: DC

## 2014-10-15 MED ORDER — SIMVASTATIN 20 MG PO TABS: ORAL_TABLET | ORAL | Status: DC

## 2014-10-15 MED ORDER — METFORMIN HCL 500 MG PO TABS: 500.0000 mg | ORAL_TABLET | Freq: Two times a day (BID) | ORAL | Status: DC

## 2014-10-15 MED ORDER — FREESTYLE SYSTEM KIT: 1.0000 | PACK | Status: DC | PRN

## 2014-10-15 MED ORDER — FERROUS SULFATE 325 (65 FE) MG PO TABS: 325.0000 mg | ORAL_TABLET | Freq: Every day | ORAL | Status: DC

## 2014-10-15 NOTE — Patient Instructions (Signed)
Great to see you. We are starting Metformin 500 mg twice daily with meals.  We are starting Ferrous sulfate- 1 tablet daily.  Please stop by to see Rosaria Ferries on your way out.  Please come see me in 3 months.

## 2014-10-15 NOTE — Assessment & Plan Note (Signed)
New without over bleeding with reassuring recent EGD/Colonoscopy. Start ferrous sulfate 325 mg daily, refer to hematology for further evaluation. The patient indicates understanding of these issues and agrees with the plan.

## 2014-10-15 NOTE — Progress Notes (Signed)
Subjective:    Patient ID: Whitney [name], female    DOB: September 02, 1960, 54 y.o.   MRN: [id number]  HPI  Depression- on Effexor 37.5 mg twice daily daily. Feels it is working well.  Denies any anxiety or depression.  Unfortunately, new onset diabetic.  She has been very fatigued with increased thirst and urination.  Drinks a lot of regular sodas.  Denies blurred vision or LE edema. Has been very tired. Strong FH of HLD and diabetes. Lab Results  Component Value Date   HGBA1C 9.0* 10/09/2014   Also new onset microcytic anemia.  Denies bloody or black stools. EGD/colonoscopy recently done by Dr. Olevia Perches- 11/21/13.  She has noticed significant fatigue and some DOE.  HLD-much improved now that she restarted zocor.  Lab Results  Component Value Date   CHOL 213* 10/09/2014   HDL 46.40 10/09/2014   LDLCALC 178* 01/31/2014   LDLDIRECT 118.0 10/09/2014   TRIG 207.0* 10/09/2014   CHOLHDL 5 10/09/2014   Lab Results  Component Value Date   TSH 1.36 10/09/2014     Patient Active Problem List   Diagnosis Date Noted  . Well woman exam 10/08/2014  . Post concussion syndrome 06/08/2013  . Syncope 06/08/2013  . OBESITY 07/08/2009  . Anemia due to other cause 02/28/2009  . BARRETTS ESOPHAGUS 10/16/2008  . HIATAL HERNIA 10/16/2008  . HLD (hyperlipidemia) 10/09/2008  . CONSTIPATION 10/09/2008  . ESOPHAGITIS, HX OF 10/09/2008  . DYSPHAGIA UNSPECIFIED 09/19/2008  . HERNIATED DISC 08/07/2008  . 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 ADD 07/24/2007  . CHOLELITHIASIS 05/26/2007  . ELEVATED BLOOD PRESSURE WITHOUT DIAGNOSIS OF HYPERTENSION 03/10/2007  . G E R D 09/27/2006  . HYPOGLYCEMIA 08/31/2006  . Depression 08/31/2006  . ANXIETY 08/17/2006  . HYPERGLYCEMIA 08/17/2006  . HEMORRHOIDS, INTERNAL 02/06/2002  . DIVERTICULAR DISEASE 02/06/2002   Past Medical History  Diagnosis Date  . HLD (hyperlipidemia)   . Internal hemorrhoids without mention of complication   . Diverticulosis    . Congenital anomalies of foot, not elsewhere classified   . Displacement of intervertebral disc, site unspecified, without myelopathy   . Attention deficit disorder without mention of hyperactivity   . Gallstones   . Elevated blood pressure reading without diagnosis of hypertension   . Esophageal reflux   . Depression   . Anxiety   . Hypoglycemia, unspecified   . Other abnormal blood chemistry   . Hiatal hernia   . Barrett's esophagus    Past Surgical History  Procedure Laterality Date  . Cholecystectomy  06/16/2007    Dr. Hulen Skains  . Colonoscopy  02/06/2002    diverticula and internal hemorroids  . Cesarean section    . Breast cyst excision Left   . Lumbar laminectomy      L5-S1  . Hiatal hernia repair    . Cardiac catheterization      [facility]   History  Substance Use Topics  . Smoking status: Former Smoker    Types: Cigarettes  . Smokeless tobacco: Never Used     Comment: 2 years as a teenager  . Alcohol Use: Yes     Comment: occassional   Family History  Problem Relation Age of Onset  . Colon polyps Mother   . Hypertension Father   . Diabetes Father   . Heart disease Father   . Colon polyps Maternal Grandmother   . Heart attack Maternal Grandmother   . Prostate cancer Maternal Grandfather   . Heart attack Paternal Grandfather   .  Colon cancer Paternal Grandmother    Allergies  Allergen Reactions  . Sertraline Hcl     REACTION: Headache/ vision changes  . Zoloft [Sertraline Hcl]    Current Outpatient Prescriptions on File Prior to Visit  Medication Sig Dispense Refill  . Aspirin-Salicylamide-Caffeine (BC HEADACHE POWDER PO) Take 1 packet by mouth daily as needed (pain).    . BEE POLLEN PO Take by mouth.    . cyclobenzaprine (FLEXERIL) 5 MG tablet Take 5 mg by mouth 3 (three) times daily as needed for muscle spasms.    Marland Kitchen esomeprazole (NEXIUM) 40 MG capsule Take 1 capsule (40 mg total) by mouth 2 (two) times daily. 60 capsule 6  . NAPROXEN SODIUM PO Take by  mouth as needed.    . simvastatin (ZOCOR) 20 MG tablet TAKE 1 TABLET BY MOUTH EVERY EVENING *PLEASE SCHEDULE LABS AND CONTACT MD FOR FURTHER REFILLS 30 tablet 0  . venlafaxine (EFFEXOR) 37.5 MG tablet TAKE 1 TABLET BY MOUTH EVERY EVENING 90 tablet 1   No current facility-administered medications on file prior to visit.   The PMH, PSH, Social History, Family History, Medications, and allergies have been reviewed in [facility], and have been updated if relevant.   Review of Systems  Constitutional: Positive for fatigue.  HENT: Negative.   Respiratory: Negative.   Cardiovascular: Negative.   Gastrointestinal: Negative.   Endocrine: Positive for polydipsia and polyuria.  Genitourinary: Negative.   Musculoskeletal: Negative.   Skin: Negative.   Allergic/Immunologic: Negative.   Neurological: Positive for dizziness.  Hematological: Negative.   Psychiatric/Behavioral: Negative.   All other systems reviewed and are negative.    Objective:   Physical Exam BP 124/68 mmHg  Pulse 94  Temp(Src) 98.4 F (36.9 C) (Oral)  Wt 211 lb 12 oz (96.049 kg)  SpO2 95%  LMP 06/17/2011 Wt Readings from Last 3 Encounters:  10/15/14 211 lb 12 oz (96.049 kg)  01/31/14 208 lb 12 oz (94.688 kg)  01/22/14 204 lb (92.534 kg)    General:  Well-developed,well-nourished,in no acute distress; alert,appropriate and cooperative throughout examination Head:  normocephalic and atraumatic.   Eyes:  vision grossly intact, pupils equal, pupils round, and pupils reactive to light.   Ears:  R ear normal and L ear normal.   Nose:  no external deformity.   Mouth:  good dentition.   Lungs:  Normal respiratory effort, chest expands symmetrically. Lungs are clear to auscultation, no crackles or wheezes. Heart:  Normal rate and regular rhythm. S1 and S2 normal without gallop, murmur, click, rub or other extra sounds. Psych:  Cognition and judgment appear intact. Alert and cooperative with normal attention span and  concentration. No apparent delusions, illusions, hallucinations     Assessment & Plan:

## 2014-10-15 NOTE — Progress Notes (Signed)
Pre visit review using our clinic review tool, if applicable. No additional management support is needed unless otherwise documented below in the visit note. 

## 2014-10-15 NOTE — Assessment & Plan Note (Signed)
New- discussed tx and management of diabetes. >25 minutes spent in face to face time with patient, >50% spent in counselling or coordination of care Refer to diabetic teaching. Start metformin 500 mg twice daily. Continue statin. Advised to schedule an eye exam. Follow up in 3 months. The patient indicates understanding of these issues and agrees with the plan.

## 2014-10-18 ENCOUNTER — Encounter: Payer: Federal, State, Local not specified - PPO | Attending: Family Medicine | Admitting: *Deleted

## 2014-10-18 ENCOUNTER — Encounter: Payer: Self-pay | Admitting: *Deleted

## 2014-10-18 VITALS — BP 124/80 | Ht 67.0 in | Wt 210.0 lb

## 2014-10-18 DIAGNOSIS — E119 Type 2 diabetes mellitus without complications: Secondary | ICD-10-CM

## 2014-10-18 NOTE — Patient Instructions (Addendum)
Check blood sugars 2 x day before breakfast and 2 hrs after supper every day Exercise:  Begin walking for 15  minutes  3 days a week and gradually increase to 30 minutes 5 days a week Eat 3 meals day,  1-2  snacks a day Space meals 4-6 hours apart Make an eye doctor appointment when blood sugars are stable Bring blood sugar records to the next class

## 2014-10-18 NOTE — Progress Notes (Signed)
Diabetes Self-Management Education  Visit Type: First/Initial  Appt. Start Time: 0845 Appt. End Time: 1005  10/18/2014  Ms. Whitney [name], identified by name and date of birth, is a 54 y.o. female with a diagnosis of Diabetes: Type 2.  Other people present during visit:  Mother and grand child  ASSESSMENT Blood pressure 124/80, height 5\' 7"  (1.702 m), weight 210 lb (95.255 kg), last menstrual period 06/17/2011. Body mass index is 32.88 kg/(m^2).  Initial Visit Information: Are you currently following a meal plan?: Yes What type of meal plan do you follow?: omitted sugar drinks and starches Are you taking your medications as prescribed?: Yes Are you checking your feet?: No How often do you need to have someone help you when you read instructions, pamphlets, or other written materials from your doctor or pharmacy?: 1 - Never What is the last grade level you completed in school?: Associates degree  Psychosocial: Patient Belief/Attitude about Diabetes: Other (comment) ("run down") Self-care barriers: None Self-management support: Family Other persons present: Parent Patient Concerns: Nutrition/Meal planning, Medication, Problem Solving, Weight Control, Healthy Lifestyle, Glycemic Control Special Needs: None Preferred Learning Style: Auditory, Visual Learning Readiness: Change in progress  Scored 13 on PHQ 9 Depression Scale  - pt currently on medication and most responses were due to high blood sugars  Complications:  Last HgB A1C per patient/outside source: 9 mg/dL (10/09/14) How often do you check your blood sugar?: 1-2 times/day Fasting Blood glucose range (mg/dL): >200 but trending down Postprandial Blood glucose range (mg/dL):  (pre- meal ranging 165-219 mg/dL) Have you had a dilated eye exam in the past 12 months?: No Have you had a dental exam in the past 12 months?: Yes  Diet Intake: Breakfast: eggs, Ezekiel bread or protein bar Snack (morning): no snacks Lunch: salads  with grilled chicken; meat with cabbage Dinner: fish and slaw; spaghetti with bread a salad; meat and squash Beverage(s): water or atrificially sweetened beverages  Exercise: Exercise: ADL's  Individualized Plan for Diabetes Self-Management Training:  Learning Objective:  Patient will have a greater understanding of diabetes self-management.  Education Topics Reviewed with Patient Today: Definition of diabetes, type 1 and 2, and the diagnosis of diabetes, Factors that contribute to the development of diabetes Role of diet in the treatment of diabetes and the relationship between the three main macronutrients and blood glucose level Role of exercise on diabetes management, blood pressure control and cardiac health. Reviewed patients medication for diabetes, action, purpose, timing of dose and side effects. Purpose and frequency of SMBG., Identified appropriate SMBG and/or A1C goals. Relationship between chronic complications and blood glucose control, Retinopathy and reason for yearly dilated eye exams Identified and addressed patients feelings and concerns about diabetes Helped patient develop diabetes management plan for preparing meals and snacks ahead of time  PATIENTS GOALS/Plan (Developed by the patient): Improve blood sugars Decrease medications Prevent diabetes complications Lose weight Lead a healthier lifestyle  Plan:   Patient Instructions  Check blood sugars 2 x day before breakfast and 2 hrs after supper every day Exercise:  Begin walking for 15  minutes  3 days a week and gradually increase to 30 minutes 5 days a week Eat 3 meals day,  1-2  snacks a day Space meals 4-6 hours apart Make an eye doctor appointment when blood sugars are stable Bring blood sugar records to the next class   Expected Outcomes:  Demonstrated interest in learning. Expect positive outcomes  Education material provided:  General Meal Planning Guidelines  If  problems or questions, patient  to contact team via:  Johny Drilling, [place], [place], CDE [phone]  Future DSME appointment:   Thursday November 07, 2014 for Class 1

## 2014-10-21 ENCOUNTER — Telehealth: Payer: Self-pay

## 2014-10-21 NOTE — Telephone Encounter (Signed)
Called and spoke with patient, and notified them that they were due for a Mammogram. Patient states that she normally goes to [facility]. Patient believes that he has had one this year, but needs to call and verify. Patient says if she is due for one, she will schedule an appointment.

## 2014-10-28 ENCOUNTER — Ambulatory Visit: Payer: Federal, State, Local not specified - PPO | Admitting: Oncology

## 2014-10-28 ENCOUNTER — Ambulatory Visit: Payer: Federal, State, Local not specified - PPO

## 2014-10-28 ENCOUNTER — Other Ambulatory Visit: Payer: Federal, State, Local not specified - PPO

## 2014-10-28 ENCOUNTER — Inpatient Hospital Stay: Payer: Federal, State, Local not specified - PPO | Attending: Oncology | Admitting: Oncology

## 2014-10-28 ENCOUNTER — Inpatient Hospital Stay: Payer: Federal, State, Local not specified - PPO

## 2014-10-28 VITALS — BP 130/87 | HR 80 | Temp 98.1°F | Ht 67.0 in | Wt 211.0 lb

## 2014-10-28 DIAGNOSIS — R5383 Other fatigue: Secondary | ICD-10-CM | POA: Diagnosis not present

## 2014-10-28 DIAGNOSIS — Z87891 Personal history of nicotine dependence: Secondary | ICD-10-CM | POA: Insufficient documentation

## 2014-10-28 DIAGNOSIS — D473 Essential (hemorrhagic) thrombocythemia: Secondary | ICD-10-CM | POA: Diagnosis not present

## 2014-10-28 DIAGNOSIS — K449 Diaphragmatic hernia without obstruction or gangrene: Secondary | ICD-10-CM | POA: Diagnosis not present

## 2014-10-28 DIAGNOSIS — Z79899 Other long term (current) drug therapy: Secondary | ICD-10-CM | POA: Insufficient documentation

## 2014-10-28 DIAGNOSIS — F419 Anxiety disorder, unspecified: Secondary | ICD-10-CM

## 2014-10-28 DIAGNOSIS — K5790 Diverticulosis of intestine, part unspecified, without perforation or abscess without bleeding: Secondary | ICD-10-CM

## 2014-10-28 DIAGNOSIS — K2270 Barrett's esophagus without dysplasia: Secondary | ICD-10-CM | POA: Insufficient documentation

## 2014-10-28 DIAGNOSIS — D509 Iron deficiency anemia, unspecified: Secondary | ICD-10-CM

## 2014-10-28 DIAGNOSIS — F329 Major depressive disorder, single episode, unspecified: Secondary | ICD-10-CM | POA: Diagnosis not present

## 2014-10-28 DIAGNOSIS — R531 Weakness: Secondary | ICD-10-CM | POA: Diagnosis not present

## 2014-10-28 DIAGNOSIS — E1165 Type 2 diabetes mellitus with hyperglycemia: Secondary | ICD-10-CM

## 2014-10-28 DIAGNOSIS — E785 Hyperlipidemia, unspecified: Secondary | ICD-10-CM

## 2014-10-28 DIAGNOSIS — K219 Gastro-esophageal reflux disease without esophagitis: Secondary | ICD-10-CM | POA: Diagnosis not present

## 2014-10-28 LAB — IRON AND TIBC
IRON: 25 ug/dL — AB (ref 28–170)
Saturation Ratios: 5 % — ABNORMAL LOW (ref 10.4–31.8)
TIBC: 513 ug/dL — ABNORMAL HIGH (ref 250–450)
UIBC: 488 ug/dL

## 2014-10-28 LAB — CBC
HCT: 36 % (ref 35.0–47.0)
Hemoglobin: 11.2 g/dL — ABNORMAL LOW (ref 12.0–16.0)
MCH: 24 pg — ABNORMAL LOW (ref 26.0–34.0)
MCHC: 31.1 g/dL — ABNORMAL LOW (ref 32.0–36.0)
MCV: 77.3 fL — AB (ref 80.0–100.0)
PLATELETS: 539 10*3/uL — AB (ref 150–440)
RBC: 4.65 MIL/uL (ref 3.80–5.20)
RDW: 17.2 % — AB (ref 11.5–14.5)
WBC: 9 10*3/uL (ref 3.6–11.0)

## 2014-10-28 LAB — FERRITIN: Ferritin: 11 ng/mL (ref 11–307)

## 2014-10-28 LAB — FOLATE: Folate: 14.8 ng/mL (ref >5.9)

## 2014-10-28 LAB — LACTATE DEHYDROGENASE: LDH: 147 U/L (ref 98–192)

## 2014-10-28 LAB — VITAMIN B12: Vitamin B-12: 630 pg/mL (ref 180–914)

## 2014-10-28 LAB — DAT, POLYSPECIFIC AHG (ARMC ONLY): POLYSPECIFIC AHG TEST: NEGATIVE

## 2014-10-28 NOTE — Progress Notes (Signed)
Pt here today for initial consult regarding anemia; referred by Dr. Deborra Medina; c/o dizziness, headaches, fatigue; h/o diabetes; pt having left dequervains release September 6th

## 2014-10-29 LAB — HAPTOGLOBIN: Haptoglobin: 170 mg/dL (ref 34–200)

## 2014-10-31 ENCOUNTER — Inpatient Hospital Stay: Payer: Federal, State, Local not specified - PPO

## 2014-10-31 VITALS — BP 118/84 | HR 84 | Temp 97.1°F

## 2014-10-31 DIAGNOSIS — D509 Iron deficiency anemia, unspecified: Secondary | ICD-10-CM | POA: Diagnosis not present

## 2014-10-31 MED ORDER — SODIUM CHLORIDE 0.9 % IV SOLN
510.0000 mg | Freq: Once | INTRAVENOUS | Status: AC
  Administered 2014-10-31: 510 mg via INTRAVENOUS
  Filled 2014-10-31: qty 17

## 2014-10-31 MED ORDER — SODIUM CHLORIDE 0.9 % IV SOLN
Freq: Once | INTRAVENOUS | Status: AC
  Administered 2014-10-31: 14:00:00 via INTRAVENOUS
  Filled 2014-10-31: qty 1000

## 2014-11-01 NOTE — Progress Notes (Signed)
[place]  Telephone:([phone] [phone]  ID: Clent Ridges OB: 12/07/1960  MR#: [id number]  GYI#:[id number]  Patient Care Team: Lucille Passy, MD as PCP - General  CHIEF COMPLAINT:  Chief Complaint  Patient presents with  . New Evaluation    anemia; referred by DR. Deborra Medina    INTERVAL HISTORY: Patient is a 54 year old female who is having can surgery in the next several weeks and was found to have a slowly declining hemoglobin. She admits to occasional weakness and fatigue, but otherwise feels well. She has no neurologic complaints. She denies any recent fevers. She denies any chest pain or shortness of breath. She denies any nausea, vomiting, constipation, or diarrhea. She has no melanoma or hematochezia. She has no urinary complaints. Patient otherwise feels well and offers no further specific complaints.  REVIEW OF SYSTEMS:   Review of Systems  Constitutional: Positive for malaise/fatigue.  Respiratory: Negative.   Cardiovascular: Negative.   Gastrointestinal: Negative.   Neurological: Positive for weakness.    As per HPI. Otherwise, a complete review of systems is negatve.  PAST MEDICAL HISTORY: Past Medical History  Diagnosis Date  . HLD (hyperlipidemia)   . Internal hemorrhoids without mention of complication   . Diverticulosis   . Congenital anomalies of foot, not elsewhere classified   . Displacement of intervertebral disc, site unspecified, without myelopathy   . Attention deficit disorder without mention of hyperactivity   . Gallstones   . Elevated blood pressure reading without diagnosis of hypertension   . Esophageal reflux   . Depression   . Anxiety   . Hypoglycemia, unspecified   . Other abnormal blood chemistry   . Hiatal hernia   . Barrett's esophagus   . Allergy     Seasonal  . Diabetes mellitus without complication     PAST SURGICAL HISTORY: Past Surgical History  Procedure Laterality Date  . Cholecystectomy   06/16/2007    Dr. Hulen Skains  . Colonoscopy  02/06/2002    diverticula and internal hemorroids  . Cesarean section    . Breast cyst excision Left   . Lumbar laminectomy      L5-S1  . Hiatal hernia repair    . Cardiac catheterization      [facility]    FAMILY HISTORY Family History  Problem Relation Age of Onset  . Colon polyps Mother   . Hypertension Father   . Diabetes Father   . Heart disease Father   . Colon polyps Maternal Grandmother   . Heart attack Maternal Grandmother   . Prostate cancer Maternal Grandfather   . Heart attack Paternal Grandfather   . Colon cancer Paternal Grandmother   . Diabetes Maternal Aunt        ADVANCED DIRECTIVES:    HEALTH MAINTENANCE: History  Substance Use Topics  . Smoking status: Former Smoker    Types: Cigarettes  . Smokeless tobacco: Never Used     Comment: 2 years as a teenager  . Alcohol Use: 0.0 oz/week    0 Standard drinks or equivalent per week     Comment: occasional wine     Colonoscopy:  PAP:  Bone density:  Lipid panel:  Allergies  Allergen Reactions  . Sertraline Hcl     REACTION: Headache/ vision changes  . Zoloft [Sertraline Hcl]     Same as Sertraline    Current Outpatient Prescriptions  Medication Sig Dispense Refill  . cyclobenzaprine (FLEXERIL) 5 MG tablet Take 5 mg by mouth 3 (three) times daily  as needed for muscle spasms.    . Esomeprazole Magnesium (NEXIUM 24HR PO) Take 2 capsules by mouth daily.    . ferrous sulfate 325 (65 FE) MG tablet Take 1 tablet (325 mg total) by mouth daily with breakfast. 30 tablet 3  . glucose blood test strip Use as instructed 100 each 12  . glucose monitoring kit (FREESTYLE) monitoring kit 1 each by Does not apply route as needed for other. Use as directe 1 each 0  . metFORMIN (GLUCOPHAGE) 500 MG tablet Take 1 tablet (500 mg total) by mouth 2 (two) times daily with a meal. 180 tablet 3  . NAPROXEN SODIUM PO Take 1 tablet by mouth 2 (two) times daily as needed.     .  venlafaxine (EFFEXOR) 37.5 MG tablet TAKE 1 TABLET BY MOUTH EVERY EVENING 90 tablet 1  . Aspirin-Salicylamide-Caffeine (BC HEADACHE POWDER PO) Take 1 packet by mouth daily as needed (pain).    . BEE POLLEN PO Take by mouth.    . Blood Glucose Monitoring Suppl (FREESTYLE LITE) DEVI USE AS DIRECTED TO TEST BLOOD SUGAR  0  . esomeprazole (NEXIUM) 40 MG capsule Take 1 capsule (40 mg total) by mouth 2 (two) times daily. (Patient not taking: Reported on 10/28/2014) 60 capsule 6  . fluticasone (FLONASE) 50 MCG/ACT nasal spray Place 2 sprays into both nostrils daily as needed for allergies or rhinitis.    Marland Kitchen simvastatin (ZOCOR) 20 MG tablet TAKE 1 TABLET BY MOUTH EVERY EVENING 30 tablet 5   No current facility-administered medications for this visit.    OBJECTIVE: Filed Vitals:   10/28/14 1114  BP: 130/87  Pulse: 80  Temp: 98.1 F (36.7 C)     Body mass index is 33.04 kg/(m^2).    ECOG FS:0 - Asymptomatic  General: Well-developed, well-nourished, no acute distress. Eyes: Pink conjunctiva, anicteric sclera. HEENT: Normocephalic, moist mucous membranes, clear oropharnyx. Lungs: Clear to auscultation bilaterally. Heart: Regular rate and rhythm. No rubs, murmurs, or gallops. Abdomen: Soft, nontender, nondistended. No organomegaly noted, normoactive bowel sounds. Musculoskeletal: No edema, cyanosis, or clubbing. Neuro: Alert, answering all questions appropriately. Cranial nerves grossly intact. Skin: No rashes or petechiae noted. Psych: Normal affect. Lymphatics: No cervical, calvicular, axillary or inguinal LAD.   LAB RESULTS:  Lab Results  Component Value Date   NA 138 10/09/2014   K 4.4 10/09/2014   CL 104 10/09/2014   CO2 26 10/09/2014   GLUCOSE 241* 10/09/2014   BUN 13 10/09/2014   CREATININE 0.77 10/09/2014   CALCIUM 9.2 10/09/2014   PROT 6.7 10/09/2014   ALBUMIN 3.7 10/09/2014   AST 20 10/09/2014   ALT 27 10/09/2014   ALKPHOS 119* 10/09/2014   BILITOT 0.3 10/09/2014    GFRNONAA >90 06/02/2013   GFRAA >90 06/02/2013    Lab Results  Component Value Date   WBC 9.0 10/28/2014   NEUTROABS 5.3 10/09/2014   HGB 11.2* 10/28/2014   HCT 36.0 10/28/2014   MCV 77.3* 10/28/2014   PLT 539* 10/28/2014     STUDIES: No results found.  ASSESSMENT: Iron deficiency anemia.  PLAN:    1. Patient's hemoglobin is only mildly decreased, but her iron stores are significantly decreased. She is also symptomatic. Remainder of her blood work is either negative or within normal limits. She will benefit from IV iron and will return to clinic in 1 and 2 weeks to receive 510 mg IV Feraheme. She will then return to clinic at the beginning of September prior to her surgery for  repeat laboratory work and further evaluation. 2. Hyperglycemia: Continue current diabetic medications. Treatment per PCP. 3. Thrombocytosis: Likely secondary to iron deficiency.  Patient expressed understanding and was in agreement with this plan. She also understands that She can call clinic at any time with any questions, concerns, or complaints.    Lloyd Huger, MD   11/01/2014 12:54 PM

## 2014-11-04 ENCOUNTER — Encounter: Payer: Federal, State, Local not specified - PPO | Attending: Family Medicine | Admitting: Dietician

## 2014-11-04 VITALS — Ht 67.0 in | Wt 208.6 lb

## 2014-11-04 DIAGNOSIS — E119 Type 2 diabetes mellitus without complications: Secondary | ICD-10-CM | POA: Insufficient documentation

## 2014-11-04 NOTE — Progress Notes (Signed)

## 2014-11-07 ENCOUNTER — Inpatient Hospital Stay: Payer: Federal, State, Local not specified - PPO

## 2014-11-11 ENCOUNTER — Inpatient Hospital Stay: Payer: Federal, State, Local not specified - PPO

## 2014-11-11 ENCOUNTER — Encounter: Payer: Federal, State, Local not specified - PPO | Admitting: Dietician

## 2014-11-11 VITALS — BP 128/79 | HR 88 | Temp 96.9°F | Resp 18

## 2014-11-11 DIAGNOSIS — D509 Iron deficiency anemia, unspecified: Secondary | ICD-10-CM

## 2014-11-11 DIAGNOSIS — E119 Type 2 diabetes mellitus without complications: Secondary | ICD-10-CM | POA: Diagnosis not present

## 2014-11-11 MED ORDER — SODIUM CHLORIDE 0.9 % IV SOLN
Freq: Once | INTRAVENOUS | Status: AC
  Administered 2014-11-11: 14:00:00 via INTRAVENOUS
  Filled 2014-11-11: qty 1000

## 2014-11-11 MED ORDER — SODIUM CHLORIDE 0.9 % IV SOLN
510.0000 mg | Freq: Once | INTRAVENOUS | Status: AC
  Administered 2014-11-11: 510 mg via INTRAVENOUS
  Filled 2014-11-11: qty 17

## 2014-11-11 NOTE — Progress Notes (Signed)

## 2014-11-12 ENCOUNTER — Telehealth: Payer: Self-pay

## 2014-11-12 NOTE — Telephone Encounter (Signed)
Have not yet spoken with pt. Unless pt is on insulin or having fluctuating blood sugars, they will only cover once daily.

## 2014-11-12 NOTE — Telephone Encounter (Signed)
Insurance may not cover checking that frequently.  Ok to write it to check 3-5 times daily but I am not clear as to whether or not it will be covered. Let's continue current dose of Metformin for now, call me next week with updated readings.

## 2014-11-12 NOTE — Telephone Encounter (Signed)
Pt left v/m requesting specific instructions of how often pt to test BS and Dx code for test strips and lancets so ins will cover at pharmacy. Diabetic class wanting pt to ck BS from 5 - 6 times a day.Diabetic teaching class nurse also suggested to pt to ck with Dr Deborra Medina about possibly increasing metformin; pts FBS range from 153- 216. Pt request cb.[facility].

## 2014-11-18 ENCOUNTER — Encounter: Payer: Federal, State, Local not specified - PPO | Admitting: Dietician

## 2014-11-18 VITALS — BP 122/82 | Ht 67.0 in | Wt 206.7 lb

## 2014-11-18 DIAGNOSIS — E119 Type 2 diabetes mellitus without complications: Secondary | ICD-10-CM

## 2014-11-18 NOTE — Progress Notes (Signed)

## 2014-11-20 ENCOUNTER — Telehealth: Payer: Self-pay | Admitting: *Deleted

## 2014-11-20 NOTE — Telephone Encounter (Signed)
Attempted to contact pt but vm is not set up. Form received from [place] indicating pt is scheduled for surgery on 09/07 and is requiring surgical clearance. Pt is needing to schedule 67min appt with Dr Deborra Medina

## 2014-11-25 NOTE — Telephone Encounter (Signed)
Another attempt made to contact pt to schedule OV for surgical clearance. Unable to leave vm

## 2014-11-25 NOTE — Telephone Encounter (Signed)
Spoke to pt and scheduled OV for surgical clearance

## 2014-11-28 ENCOUNTER — Inpatient Hospital Stay: Payer: Federal, State, Local not specified - PPO

## 2014-11-28 ENCOUNTER — Ambulatory Visit (INDEPENDENT_AMBULATORY_CARE_PROVIDER_SITE_OTHER): Payer: Federal, State, Local not specified - PPO | Admitting: Family Medicine

## 2014-11-28 ENCOUNTER — Encounter: Payer: Self-pay | Admitting: Family Medicine

## 2014-11-28 ENCOUNTER — Inpatient Hospital Stay: Payer: Federal, State, Local not specified - PPO | Attending: Oncology

## 2014-11-28 ENCOUNTER — Inpatient Hospital Stay (HOSPITAL_BASED_OUTPATIENT_CLINIC_OR_DEPARTMENT_OTHER): Payer: Federal, State, Local not specified - PPO | Admitting: Oncology

## 2014-11-28 VITALS — BP 124/79 | HR 98 | Temp 97.6°F | Resp 16 | Wt 207.0 lb

## 2014-11-28 VITALS — BP 128/82 | HR 77 | Temp 98.0°F | Wt 206.5 lb

## 2014-11-28 DIAGNOSIS — K449 Diaphragmatic hernia without obstruction or gangrene: Secondary | ICD-10-CM

## 2014-11-28 DIAGNOSIS — E1165 Type 2 diabetes mellitus with hyperglycemia: Secondary | ICD-10-CM | POA: Insufficient documentation

## 2014-11-28 DIAGNOSIS — K5790 Diverticulosis of intestine, part unspecified, without perforation or abscess without bleeding: Secondary | ICD-10-CM | POA: Diagnosis not present

## 2014-11-28 DIAGNOSIS — K8080 Other cholelithiasis without obstruction: Secondary | ICD-10-CM

## 2014-11-28 DIAGNOSIS — Z01818 Encounter for other preprocedural examination: Secondary | ICD-10-CM

## 2014-11-28 DIAGNOSIS — Z87891 Personal history of nicotine dependence: Secondary | ICD-10-CM | POA: Insufficient documentation

## 2014-11-28 DIAGNOSIS — Z794 Long term (current) use of insulin: Secondary | ICD-10-CM | POA: Insufficient documentation

## 2014-11-28 DIAGNOSIS — E785 Hyperlipidemia, unspecified: Secondary | ICD-10-CM

## 2014-11-28 DIAGNOSIS — Z23 Encounter for immunization: Secondary | ICD-10-CM | POA: Diagnosis not present

## 2014-11-28 DIAGNOSIS — Z79899 Other long term (current) drug therapy: Secondary | ICD-10-CM | POA: Diagnosis not present

## 2014-11-28 DIAGNOSIS — E119 Type 2 diabetes mellitus without complications: Secondary | ICD-10-CM | POA: Insufficient documentation

## 2014-11-28 DIAGNOSIS — F419 Anxiety disorder, unspecified: Secondary | ICD-10-CM

## 2014-11-28 DIAGNOSIS — E162 Hypoglycemia, unspecified: Secondary | ICD-10-CM | POA: Insufficient documentation

## 2014-11-28 DIAGNOSIS — D509 Iron deficiency anemia, unspecified: Secondary | ICD-10-CM | POA: Diagnosis not present

## 2014-11-28 DIAGNOSIS — K2270 Barrett's esophagus without dysplasia: Secondary | ICD-10-CM | POA: Diagnosis not present

## 2014-11-28 DIAGNOSIS — F329 Major depressive disorder, single episode, unspecified: Secondary | ICD-10-CM | POA: Insufficient documentation

## 2014-11-28 DIAGNOSIS — Z800 Family history of malignant neoplasm of digestive organs: Secondary | ICD-10-CM | POA: Diagnosis not present

## 2014-11-28 DIAGNOSIS — K219 Gastro-esophageal reflux disease without esophagitis: Secondary | ICD-10-CM

## 2014-11-28 LAB — IRON AND TIBC
Iron: 73 ug/dL (ref 28–170)
SATURATION RATIOS: 21 % (ref 10.4–31.8)
TIBC: 355 ug/dL (ref 250–450)
UIBC: 282 ug/dL

## 2014-11-28 LAB — CBC WITH DIFFERENTIAL/PLATELET
BASOS PCT: 1 %
Basophils Absolute: 0.1 10*3/uL (ref 0–0.1)
EOS ABS: 0.2 10*3/uL (ref 0–0.7)
EOS PCT: 2 %
HCT: 43.4 % (ref 35.0–47.0)
Hemoglobin: 14.3 g/dL (ref 12.0–16.0)
LYMPHS ABS: 2.5 10*3/uL (ref 1.0–3.6)
Lymphocytes Relative: 25 %
MCH: 26.9 pg (ref 26.0–34.0)
MCHC: 33 g/dL (ref 32.0–36.0)
MCV: 81.7 fL (ref 80.0–100.0)
MONOS PCT: 6 %
Monocytes Absolute: 0.6 10*3/uL (ref 0.2–0.9)
NEUTROS PCT: 66 %
Neutro Abs: 6.7 10*3/uL — ABNORMAL HIGH (ref 1.4–6.5)
PLATELETS: 373 10*3/uL (ref 150–440)
RBC: 5.31 MIL/uL — AB (ref 3.80–5.20)
RDW: 21.5 % — ABNORMAL HIGH (ref 11.5–14.5)
WBC: 10.1 10*3/uL (ref 3.6–11.0)

## 2014-11-28 LAB — FERRITIN: Ferritin: 222 ng/mL (ref 11–307)

## 2014-11-28 NOTE — Progress Notes (Signed)
Pre visit review using our clinic review tool, if applicable. No additional management support is needed unless otherwise documented below in the visit note. 

## 2014-11-28 NOTE — Progress Notes (Signed)
Subjective:    Whitney [name] is a 54 y.o. female who presents to the office today for a preoperative consultation at the request of surgeon Caffrey who plans on performing Left Dequervains release on September 7. This consultation is requested for the specific conditions prompting preoperative evaluation (i.e. because of potential affect on operative risk): none.   Has been under general anesthesia "at least 10 times" and has never had an issue with intubation, extubation or anesthesia.  The following portions of the patient's history were reviewed and updated as appropriate: allergies, current medications, past family history, past medical history, past social history, past surgical history and problem list.  Review of Systems Pertinent items are noted in HPI.    Objective:    BP 128/82 mmHg  Pulse 77  Temp(Src) 98 F (36.7 C) (Oral)  Wt 206 lb 8 oz (93.668 kg)  SpO2 96%  LMP 06/17/2011  General Appearance:    Alert, cooperative, no distress, appears stated age  Head:    Normocephalic, without obvious abnormality, atraumatic  Eyes:    PERRL, conjunctiva/corneas clear, EOM's intact, fundi    benign, both eyes  Ears:    Normal TM's and external ear canals, both ears  Nose:   Nares normal, septum midline, mucosa normal, no drainage    or sinus tenderness  Throat:   Lips, mucosa, and tongue normal; teeth and gums normal  Neck:   Supple, symmetrical, trachea midline, no adenopathy;    thyroid:  no enlargement/tenderness/nodules; no carotid   bruit or JVD  Back:     Symmetric, no curvature, ROM normal, no CVA tenderness  Lungs:     Clear to auscultation bilaterally, respirations unlabored  Chest Wall:    No tenderness or deformity   Heart:    Regular rate and rhythm, S1 and S2 normal, no murmur, rub   or gallop     Abdomen:     Soft, non-tender, bowel sounds active all four quadrants,    no masses, no organomegaly  Extremities:   Extremities normal, atraumatic, no cyanosis or edema   Pulses:   2+ and symmetric all extremities  Skin:   Skin color, texture, turgor normal, no rashes or lesions  Lymph nodes:   Cervical, supraclavicular, and axillary nodes normal  Neurologic:   CNII-XII intact, normal strength, sensation and reflexes    throughout      Cardiographics ECG: normal sinus rhythm, no blocks or conduction defects, no ischemic changes   Lab Results  Component Value Date   CREATININE 0.77 10/09/2014      Assessment:      54 y.o. female with planned surgery as above.      Cardiac Risk Estimation: low

## 2014-11-29 ENCOUNTER — Encounter: Payer: Self-pay | Admitting: *Deleted

## 2014-12-02 NOTE — Progress Notes (Signed)
[place]  [phone]) [phone] Fax:(336) [phone]  ID: Whitney [name] OB: June 05, 1960  MR#: [id number]  IOE#:[id number]  Patient Care Team: Lucille Passy, MD as PCP - General  CHIEF COMPLAINT:  Chief Complaint  Patient presents with  . Follow-up    IDA    INTERVAL HISTORY: Patient returns to clinic today for repeat laboratory work and further evaluation. She currently feels well and is asymptomatic. She has no neurologic complaints. She denies any recent fevers. She denies any chest pain or shortness of breath. She denies any nausea, vomiting, constipation, or diarrhea. She has no melanoma or hematochezia. She has no urinary complaints. Patient offers no specific complaints today.  REVIEW OF SYSTEMS:   Review of Systems  Constitutional: Negative.   Respiratory: Negative.   Cardiovascular: Negative.   Gastrointestinal: Negative.   Neurological: Negative.     As per HPI. Otherwise, a complete review of systems is negatve.  PAST MEDICAL HISTORY: Past Medical History  Diagnosis Date  . HLD (hyperlipidemia)   . Internal hemorrhoids without mention of complication   . Diverticulosis   . Congenital anomalies of foot, not elsewhere classified   . Displacement of intervertebral disc, site unspecified, without myelopathy   . Attention deficit disorder without mention of hyperactivity   . Gallstones   . Elevated blood pressure reading without diagnosis of hypertension   . Esophageal reflux   . Depression   . Anxiety   . Hypoglycemia, unspecified   . Other abnormal blood chemistry   . Hiatal hernia   . Barrett's esophagus   . Allergy     Seasonal  . Diabetes mellitus without complication     PAST SURGICAL HISTORY: Past Surgical History  Procedure Laterality Date  . Cholecystectomy  06/16/2007    Dr. Hulen Skains  . Colonoscopy  02/06/2002    diverticula and internal hemorroids  . Cesarean section    . Breast cyst excision Left   . Lumbar laminectomy     L5-S1  . Hiatal hernia repair    . Cardiac catheterization      [facility]    FAMILY HISTORY Family History  Problem Relation Age of Onset  . Colon polyps Mother   . Hypertension Father   . Diabetes Father   . Heart disease Father   . Colon polyps Maternal Grandmother   . Heart attack Maternal Grandmother   . Prostate cancer Maternal Grandfather   . Heart attack Paternal Grandfather   . Colon cancer Paternal Grandmother   . Diabetes Maternal Aunt        ADVANCED DIRECTIVES:    HEALTH MAINTENANCE: Social History  Substance Use Topics  . Smoking status: Former Smoker    Types: Cigarettes  . Smokeless tobacco: Never Used     Comment: 2 years as a teenager  . Alcohol Use: 0.0 oz/week    0 Standard drinks or equivalent per week     Comment: occasional wine     Colonoscopy:  PAP:  Bone density:  Lipid panel:  Allergies  Allergen Reactions  . Sertraline Hcl     REACTION: Headache/ vision changes  . Zoloft [Sertraline Hcl]     Same as Sertraline    Current Outpatient Prescriptions  Medication Sig Dispense Refill  . Aspirin-Salicylamide-Caffeine (BC HEADACHE POWDER PO) Take 1 packet by mouth daily as needed (pain).    . BEE POLLEN PO Take by mouth.    . cyclobenzaprine (FLEXERIL) 5 MG tablet Take 5 mg by mouth 3 (three) times daily  as needed for muscle spasms.    Marland Kitchen esomeprazole (NEXIUM) 40 MG capsule Take 1 capsule (40 mg total) by mouth 2 (two) times daily. 60 capsule 6  . Esomeprazole Magnesium (NEXIUM 24HR PO) Take 2 capsules by mouth daily.    . ferrous sulfate 325 (65 FE) MG tablet Take 1 tablet (325 mg total) by mouth daily with breakfast. 30 tablet 3  . fluticasone (FLONASE) 50 MCG/ACT nasal spray Place 2 sprays into both nostrils daily as needed for allergies or rhinitis.    . metFORMIN (GLUCOPHAGE) 500 MG tablet Take 1 tablet (500 mg total) by mouth 2 (two) times daily with a meal. 180 tablet 3  . NAPROXEN SODIUM PO Take 1 tablet by mouth 2 (two) times daily as  needed.     . simvastatin (ZOCOR) 20 MG tablet TAKE 1 TABLET BY MOUTH EVERY EVENING 30 tablet 5  . venlafaxine (EFFEXOR) 37.5 MG tablet TAKE 1 TABLET BY MOUTH EVERY EVENING 90 tablet 1  . Blood Glucose Monitoring Suppl (FREESTYLE LITE) DEVI USE AS DIRECTED TO TEST BLOOD SUGAR  0  . glucose blood test strip Use as instructed (Patient not taking: Reported on 11/28/2014) 100 each 12  . glucose monitoring kit (FREESTYLE) monitoring kit 1 each by Does not apply route as needed for other. Use as directe (Patient not taking: Reported on 11/28/2014) 1 each 0  . Lancets (FREESTYLE) lancets 1 each by Other route as needed for other. Use as instructed     No current facility-administered medications for this visit.    OBJECTIVE: Filed Vitals:   11/28/14 1403  BP: 124/79  Pulse: 98  Temp: 97.6 F (36.4 C)  Resp: 16     Body mass index is 32.42 kg/(m^2).    ECOG FS:0 - Asymptomatic  General: Well-developed, well-nourished, no acute distress. Eyes: Pink conjunctiva, anicteric sclera. Lungs: Clear to auscultation bilaterally. Heart: Regular rate and rhythm. No rubs, murmurs, or gallops. Abdomen: Soft, nontender, nondistended. No organomegaly noted, normoactive bowel sounds. Musculoskeletal: No edema, cyanosis, or clubbing. Neuro: Alert, answering all questions appropriately. Cranial nerves grossly intact. Skin: No rashes or petechiae noted. Psych: Normal affect.   LAB RESULTS:  Lab Results  Component Value Date   NA 138 10/09/2014   K 4.4 10/09/2014   CL 104 10/09/2014   CO2 26 10/09/2014   GLUCOSE 241* 10/09/2014   BUN 13 10/09/2014   CREATININE 0.77 10/09/2014   CALCIUM 9.2 10/09/2014   PROT 6.7 10/09/2014   ALBUMIN 3.7 10/09/2014   AST 20 10/09/2014   ALT 27 10/09/2014   ALKPHOS 119* 10/09/2014   BILITOT 0.3 10/09/2014   GFRNONAA >90 06/02/2013   GFRAA >90 06/02/2013    Lab Results  Component Value Date   WBC 10.1 11/28/2014   NEUTROABS 6.7* 11/28/2014   HGB 14.3 11/28/2014     HCT 43.4 11/28/2014   MCV 81.7 11/28/2014   PLT 373 11/28/2014     STUDIES: No results found.  ASSESSMENT: Iron deficiency anemia.  PLAN:    1. Anemia: Patient's hemoglobin and iron stores have improved and are now within normal limits since receiving 2 infusions of 510 mg IV Feraheme 4-6 weeks ago. No intervention is needed at this time. Return to clinic in 4 months with repeat laboratory work and further evaluation.  2. Hyperglycemia: Continue current diabetic medications. Treatment per PCP. 3. Thrombocytosis: Resolved.  Likely secondary to iron deficiency. 4. Surgery: Proceed as scheduled.  Patient expressed understanding and was in agreement with this plan.  She also understands that She can call clinic at any time with any questions, concerns, or complaints.    Lloyd Huger, MD   12/02/2014 11:20 AM

## 2014-12-12 ENCOUNTER — Telehealth: Payer: Self-pay

## 2014-12-12 NOTE — Telephone Encounter (Signed)
Pt left v/m; pt was seen 11/28/14 and forgot to ask Dr Deborra Medina; pt has had iron infusions and was told iron level is to 13.5 - 14. Pt wants to know if needs to continue oral ferrous sulfate. Please advise.

## 2014-12-13 NOTE — Telephone Encounter (Signed)
Probably not but she should verify that she no longer needs to take it with her hematologist, Dr. Grayland Ormond.

## 2014-12-13 NOTE — Telephone Encounter (Signed)
Attempted to contact pt; vm not set up to leave message 

## 2014-12-16 NOTE — Telephone Encounter (Signed)
Spoke to pt and advised per Dr Aron. Pt verbally expressed understanding.  

## 2014-12-30 ENCOUNTER — Ambulatory Visit (INDEPENDENT_AMBULATORY_CARE_PROVIDER_SITE_OTHER): Payer: Federal, State, Local not specified - PPO | Admitting: Family Medicine

## 2014-12-30 VITALS — BP 114/68 | HR 76 | Temp 98.1°F | Wt 202.8 lb

## 2014-12-30 DIAGNOSIS — D509 Iron deficiency anemia, unspecified: Secondary | ICD-10-CM | POA: Diagnosis not present

## 2014-12-30 DIAGNOSIS — E119 Type 2 diabetes mellitus without complications: Secondary | ICD-10-CM | POA: Diagnosis not present

## 2014-12-30 DIAGNOSIS — E611 Iron deficiency: Secondary | ICD-10-CM | POA: Insufficient documentation

## 2014-12-30 DIAGNOSIS — E785 Hyperlipidemia, unspecified: Secondary | ICD-10-CM

## 2014-12-30 LAB — CBC WITH DIFFERENTIAL/PLATELET
BASOS ABS: 0.1 10*3/uL (ref 0.0–0.1)
Basophils Relative: 0.7 % (ref 0.0–3.0)
EOS PCT: 2.5 % (ref 0.0–5.0)
Eosinophils Absolute: 0.2 10*3/uL (ref 0.0–0.7)
HCT: 44.7 % (ref 36.0–46.0)
Hemoglobin: 14.4 g/dL (ref 12.0–15.0)
LYMPHS ABS: 1.9 10*3/uL (ref 0.7–4.0)
Lymphocytes Relative: 22.1 % (ref 12.0–46.0)
MCHC: 32.2 g/dL (ref 30.0–36.0)
MCV: 85.1 fl (ref 78.0–100.0)
MONOS PCT: 7 % (ref 3.0–12.0)
Monocytes Absolute: 0.6 10*3/uL (ref 0.1–1.0)
NEUTROS ABS: 5.8 10*3/uL (ref 1.4–7.7)
NEUTROS PCT: 67.7 % (ref 43.0–77.0)
PLATELETS: 372 10*3/uL (ref 150.0–400.0)
RBC: 5.25 Mil/uL — ABNORMAL HIGH (ref 3.87–5.11)
RDW: 21.7 % — AB (ref 11.5–15.5)
WBC: 8.6 10*3/uL (ref 4.0–10.5)

## 2014-12-30 LAB — COMPREHENSIVE METABOLIC PANEL
ALT: 30 U/L (ref 0–35)
AST: 22 U/L (ref 0–37)
Albumin: 3.8 g/dL (ref 3.5–5.2)
Alkaline Phosphatase: 97 U/L (ref 39–117)
BUN: 15 mg/dL (ref 6–23)
CHLORIDE: 105 meq/L (ref 96–112)
CO2: 28 meq/L (ref 19–32)
Calcium: 9.4 mg/dL (ref 8.4–10.5)
Creatinine, Ser: 0.7 mg/dL (ref 0.40–1.20)
GFR: 92.06 mL/min (ref >60.00)
GLUCOSE: 133 mg/dL — AB (ref 70–99)
POTASSIUM: 4.3 meq/L (ref 3.5–5.1)
Sodium: 141 mEq/L (ref 135–145)
Total Bilirubin: 0.2 mg/dL (ref 0.2–1.2)
Total Protein: 6.6 g/dL (ref 6.0–8.3)

## 2014-12-30 LAB — MICROALBUMIN / CREATININE URINE RATIO
Creatinine,U: 101.7 mg/dL
Microalb Creat Ratio: 0.7 mg/g (ref 0.0–30.0)

## 2014-12-30 LAB — LIPID PANEL
CHOL/HDL RATIO: 4
CHOLESTEROL: 170 mg/dL (ref 0–200)
HDL: 47 mg/dL (ref >39.00)
NonHDL: 123.13
TRIGLYCERIDES: 236 mg/dL — AB (ref 0.0–149.0)
VLDL: 47.2 mg/dL — ABNORMAL HIGH (ref 0.0–40.0)

## 2014-12-30 LAB — HEMOGLOBIN A1C: Hgb A1c MFr Bld: 7.5 % — ABNORMAL HIGH (ref 4.6–6.5)

## 2014-12-30 NOTE — Assessment & Plan Note (Signed)
Improved.  Continue current dose of statin.

## 2014-12-30 NOTE — Progress Notes (Signed)
Subjective:    Patient ID: Whitney [name], female    DOB: 06-Oct-1960, 54 y.o.   MRN: [id number]  HPI Diabetes- recently diagnosed (this summer).   She has been very fatigued with increased thirst and urination.  Taking Metformin 500 mg twice daily.  Referred her for diabetic teaching. Strong FH of HLD and diabetes.  Checks FSBS once daily- morning ranging less than 150.  Has not had episodes of hypoglycemia.  Lab Results  Component Value Date   HGBA1C 9.0* 10/09/2014   Microcytic anemia- referred her to hematology.  Saw Dr. Grayland Ormond on 11/28/14- note reviewed, iron store improved and now normal s/p 2 infusions of iron. Advised no further intervention and a follow up in 4 months. SOB and DOE resolved. .  Denies bloody or black stools. EGD/colonoscopy recently done by Dr. Olevia Perches- 11/21/13.    HLD-much improved now that she restarted zocor.  Lab Results  Component Value Date   CHOL 213* 10/09/2014   HDL 46.40 10/09/2014   LDLCALC 178* 01/31/2014   LDLDIRECT 118.0 10/09/2014   TRIG 207.0* 10/09/2014   CHOLHDL 5 10/09/2014   Lab Results  Component Value Date   TSH 1.36 10/09/2014     Patient Active Problem List   Diagnosis Date Noted  . Anemia, iron deficiency 12/30/2014  . New onset type 2 diabetes mellitus ([place]) 10/15/2014  . Microcytic anemia 10/15/2014  . OBESITY 07/08/2009  . Anemia due to other cause 02/28/2009  . BARRETTS ESOPHAGUS 10/16/2008  . HIATAL HERNIA 10/16/2008  . HLD (hyperlipidemia) 10/09/2008  . CONSTIPATION 10/09/2008  . ESOPHAGITIS, HX OF 10/09/2008  . DYSPHAGIA UNSPECIFIED 09/19/2008  . HERNIATED DISC 08/07/2008  . 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 ADD 07/24/2007  . CHOLELITHIASIS 05/26/2007  . ELEVATED BLOOD PRESSURE WITHOUT DIAGNOSIS OF HYPERTENSION 03/10/2007  . G E R D 09/27/2006  . Depression 08/31/2006  . ANXIETY 08/17/2006  . HYPERGLYCEMIA 08/17/2006  . HEMORRHOIDS, INTERNAL 02/06/2002  . DIVERTICULAR DISEASE 02/06/2002    Past Medical History  Diagnosis Date  . HLD (hyperlipidemia)   . Internal hemorrhoids without mention of complication   . Diverticulosis   . Congenital anomalies of foot, not elsewhere classified   . Displacement of intervertebral disc, site unspecified, without myelopathy   . Attention deficit disorder without mention of hyperactivity   . Gallstones   . Elevated blood pressure reading without diagnosis of hypertension   . Esophageal reflux   . Depression   . Anxiety   . Hypoglycemia, unspecified   . Other abnormal blood chemistry   . Hiatal hernia   . Barrett's esophagus   . Allergy     Seasonal  . Diabetes mellitus without complication    Past Surgical History  Procedure Laterality Date  . Cholecystectomy  06/16/2007    Dr. Hulen Skains  . Colonoscopy  02/06/2002    diverticula and internal hemorroids  . Cesarean section    . Breast cyst excision Left   . Lumbar laminectomy      L5-S1  . Hiatal hernia repair    . Cardiac catheterization      [facility]   Social History  Substance Use Topics  . Smoking status: Former Smoker    Types: Cigarettes  . Smokeless tobacco: Never Used     Comment: 2 years as a teenager  . Alcohol Use: 0.0 oz/week    0 Standard drinks or equivalent per week     Comment: occasional wine   Family History  Problem Relation Age of  Onset  . Colon polyps Mother   . Hypertension Father   . Diabetes Father   . Heart disease Father   . Colon polyps Maternal Grandmother   . Heart attack Maternal Grandmother   . Prostate cancer Maternal Grandfather   . Heart attack Paternal Grandfather   . Colon cancer Paternal Grandmother   . Diabetes Maternal Aunt    Allergies  Allergen Reactions  . Sertraline Hcl     REACTION: Headache/ vision changes  . Zoloft [Sertraline Hcl]     Same as Sertraline   Current Outpatient Prescriptions on File Prior to Visit  Medication Sig Dispense Refill  . Aspirin-Salicylamide-Caffeine (BC HEADACHE POWDER PO) Take 1 packet  by mouth daily as needed (pain).    . Blood Glucose Monitoring Suppl (FREESTYLE LITE) DEVI USE AS DIRECTED TO TEST BLOOD SUGAR  0  . cyclobenzaprine (FLEXERIL) 5 MG tablet Take 5 mg by mouth 3 (three) times daily as needed for muscle spasms.    Marland Kitchen esomeprazole (NEXIUM) 40 MG capsule Take 1 capsule (40 mg total) by mouth 2 (two) times daily. 60 capsule 6  . Esomeprazole Magnesium (NEXIUM 24HR PO) Take 2 capsules by mouth daily.    . fluticasone (FLONASE) 50 MCG/ACT nasal spray Place 2 sprays into both nostrils daily as needed for allergies or rhinitis.    Marland Kitchen glucose blood test strip Use as instructed 100 each 12  . glucose monitoring kit (FREESTYLE) monitoring kit 1 each by Does not apply route as needed for other. Use as directe 1 each 0  . Lancets (FREESTYLE) lancets 1 each by Other route as needed for other. Use as instructed    . metFORMIN (GLUCOPHAGE) 500 MG tablet Take 1 tablet (500 mg total) by mouth 2 (two) times daily with a meal. 180 tablet 3  . NAPROXEN SODIUM PO Take 1 tablet by mouth 2 (two) times daily as needed.     . simvastatin (ZOCOR) 20 MG tablet TAKE 1 TABLET BY MOUTH EVERY EVENING 30 tablet 5  . venlafaxine (EFFEXOR) 37.5 MG tablet TAKE 1 TABLET BY MOUTH EVERY EVENING 90 tablet 1  . ferrous sulfate 325 (65 FE) MG tablet Take 1 tablet (325 mg total) by mouth daily with breakfast. (Patient not taking: Reported on 12/30/2014) 30 tablet 3   No current facility-administered medications on file prior to visit.   The PMH, PSH, Social History, Family History, Medications, and allergies have been reviewed in [facility], and have been updated if relevant.   Review of Systems  Constitutional: Negative for fatigue.  HENT: Negative.   Respiratory: Negative.   Cardiovascular: Negative.   Gastrointestinal: Negative.   Endocrine: Negative for polydipsia and polyuria.  Genitourinary: Negative.   Musculoskeletal: Negative.   Skin: Negative.   Allergic/Immunologic: Negative.   Neurological:  Negative for dizziness.  Hematological: Negative.   Psychiatric/Behavioral: Negative.   All other systems reviewed and are negative.    Objective:   Physical Exam BP 114/68 mmHg  Pulse 76  Temp(Src) 98.1 F (36.7 C) (Oral)  Wt 202 lb 12 oz (91.967 kg)  SpO2 97%  LMP 06/17/2011 Wt Readings from Last 3 Encounters:  12/30/14 202 lb 12 oz (91.967 kg)  11/28/14 207 lb 0.2 oz (93.9 kg)  11/28/14 206 lb 8 oz (93.668 kg)    General:  Well-developed,well-nourished,in no acute distress; alert,appropriate and cooperative throughout examination Head:  normocephalic and atraumatic.   Eyes:  vision grossly intact, pupils equal, pupils round, and pupils reactive to light.   Ears:  R ear normal and L ear normal.   Nose:  no external deformity.   Mouth:  good dentition.   Lungs:  Normal respiratory effort, chest expands symmetrically. Lungs are clear to auscultation, no crackles or wheezes. Heart:  Normal rate and regular rhythm. S1 and S2 normal without gallop, murmur, click, rub or other extra sounds. Psych:  Cognition and judgment appear intact. Alert and cooperative with normal attention span and concentration. No apparent delusions, illusions, hallucinations     Assessment & Plan:

## 2014-12-30 NOTE — Assessment & Plan Note (Signed)
Continue Metformin at current dose. Check labs and urine micro today. The patient indicates understanding of these issues and agrees with the plan.

## 2014-12-30 NOTE — Patient Instructions (Signed)
Great to see you.  We will call you with your lab results. 

## 2014-12-30 NOTE — Assessment & Plan Note (Signed)
S/p IV iron x 2. Check CBC today. If stable, defer oral iron.  Advised to also call Dr. Grayland Ormond to discuss oral iron. The patient indicates understanding of these issues and agrees with the plan.

## 2014-12-30 NOTE — Progress Notes (Signed)
Pre visit review using our clinic review tool, if applicable. No additional management support is needed unless otherwise documented below in the visit note. 

## 2014-12-31 LAB — LDL CHOLESTEROL, DIRECT: Direct LDL: 99 mg/dL

## 2015-01-01 ENCOUNTER — Encounter: Payer: Self-pay | Admitting: *Deleted

## 2015-01-29 ENCOUNTER — Other Ambulatory Visit: Payer: Self-pay | Admitting: Family Medicine

## 2015-02-07 LAB — HM DIABETES EYE EXAM

## 2015-02-10 ENCOUNTER — Telehealth: Payer: Self-pay | Admitting: Family Medicine

## 2015-02-10 NOTE — Telephone Encounter (Signed)
Never mind.  Just received it!

## 2015-02-10 NOTE — Telephone Encounter (Signed)
Cecille Rubin at [facility] called to make sure you received patient's Diabetic Eye Exam report.  She faxed it here last week.

## 2015-02-10 NOTE — Telephone Encounter (Signed)
I dont see it.  Please as her to refax.

## 2015-03-07 ENCOUNTER — Other Ambulatory Visit: Payer: Self-pay | Admitting: Family Medicine

## 2015-03-31 ENCOUNTER — Other Ambulatory Visit: Payer: Federal, State, Local not specified - PPO

## 2015-03-31 ENCOUNTER — Ambulatory Visit: Payer: Federal, State, Local not specified - PPO | Admitting: Oncology

## 2015-03-31 ENCOUNTER — Ambulatory Visit: Payer: Federal, State, Local not specified - PPO

## 2015-04-03 ENCOUNTER — Inpatient Hospital Stay: Payer: Federal, State, Local not specified - PPO

## 2015-04-03 ENCOUNTER — Inpatient Hospital Stay: Payer: Federal, State, Local not specified - PPO | Admitting: Oncology

## 2015-04-14 ENCOUNTER — Other Ambulatory Visit: Payer: Self-pay | Admitting: *Deleted

## 2015-04-14 MED ORDER — SIMVASTATIN 20 MG PO TABS: 20.0000 mg | ORAL_TABLET | Freq: Every evening | ORAL | Status: DC

## 2015-04-16 ENCOUNTER — Inpatient Hospital Stay: Payer: Federal, State, Local not specified - PPO | Attending: Oncology | Admitting: Oncology

## 2015-04-16 ENCOUNTER — Inpatient Hospital Stay: Payer: Federal, State, Local not specified - PPO

## 2015-04-16 VITALS — BP 126/87 | HR 97 | Temp 98.1°F | Resp 18 | Wt 209.4 lb

## 2015-04-16 DIAGNOSIS — Z87891 Personal history of nicotine dependence: Secondary | ICD-10-CM | POA: Insufficient documentation

## 2015-04-16 DIAGNOSIS — Q669 Congenital deformity of feet, unspecified: Secondary | ICD-10-CM | POA: Diagnosis not present

## 2015-04-16 DIAGNOSIS — Z8719 Personal history of other diseases of the digestive system: Secondary | ICD-10-CM

## 2015-04-16 DIAGNOSIS — F329 Major depressive disorder, single episode, unspecified: Secondary | ICD-10-CM

## 2015-04-16 DIAGNOSIS — E785 Hyperlipidemia, unspecified: Secondary | ICD-10-CM | POA: Diagnosis not present

## 2015-04-16 DIAGNOSIS — R030 Elevated blood-pressure reading, without diagnosis of hypertension: Secondary | ICD-10-CM | POA: Diagnosis not present

## 2015-04-16 DIAGNOSIS — D509 Iron deficiency anemia, unspecified: Secondary | ICD-10-CM | POA: Diagnosis not present

## 2015-04-16 DIAGNOSIS — K648 Other hemorrhoids: Secondary | ICD-10-CM | POA: Diagnosis not present

## 2015-04-16 DIAGNOSIS — R5383 Other fatigue: Secondary | ICD-10-CM | POA: Diagnosis not present

## 2015-04-16 DIAGNOSIS — Z7984 Long term (current) use of oral hypoglycemic drugs: Secondary | ICD-10-CM | POA: Insufficient documentation

## 2015-04-16 DIAGNOSIS — F419 Anxiety disorder, unspecified: Secondary | ICD-10-CM | POA: Diagnosis not present

## 2015-04-16 DIAGNOSIS — K2270 Barrett's esophagus without dysplasia: Secondary | ICD-10-CM | POA: Diagnosis not present

## 2015-04-16 DIAGNOSIS — Z7982 Long term (current) use of aspirin: Secondary | ICD-10-CM

## 2015-04-16 DIAGNOSIS — K219 Gastro-esophageal reflux disease without esophagitis: Secondary | ICD-10-CM | POA: Diagnosis not present

## 2015-04-16 DIAGNOSIS — Z79899 Other long term (current) drug therapy: Secondary | ICD-10-CM | POA: Diagnosis not present

## 2015-04-16 DIAGNOSIS — R531 Weakness: Secondary | ICD-10-CM | POA: Diagnosis not present

## 2015-04-16 DIAGNOSIS — E1165 Type 2 diabetes mellitus with hyperglycemia: Secondary | ICD-10-CM

## 2015-04-16 LAB — CBC WITH DIFFERENTIAL/PLATELET
Basophils Absolute: 0.1 10*3/uL (ref 0–0.1)
Basophils Relative: 1 %
EOS ABS: 0.2 10*3/uL (ref 0–0.7)
EOS PCT: 2 %
HCT: 45.5 % (ref 35.0–47.0)
Hemoglobin: 15.2 g/dL (ref 12.0–16.0)
LYMPHS ABS: 2.4 10*3/uL (ref 1.0–3.6)
Lymphocytes Relative: 23 %
MCH: 30.3 pg (ref 26.0–34.0)
MCHC: 33.5 g/dL (ref 32.0–36.0)
MCV: 90.4 fL (ref 80.0–100.0)
Monocytes Absolute: 0.7 10*3/uL (ref 0.2–0.9)
Monocytes Relative: 7 %
Neutro Abs: 7 10*3/uL — ABNORMAL HIGH (ref 1.4–6.5)
Neutrophils Relative %: 67 %
PLATELETS: 395 10*3/uL (ref 150–440)
RBC: 5.04 MIL/uL (ref 3.80–5.20)
RDW: 13.3 % (ref 11.5–14.5)
WBC: 10.5 10*3/uL (ref 3.6–11.0)

## 2015-04-16 LAB — IRON AND TIBC
Iron: 59 ug/dL (ref 28–170)
SATURATION RATIOS: 15 % (ref 10.4–31.8)
TIBC: 394 ug/dL (ref 250–450)
UIBC: 335 ug/dL

## 2015-04-16 LAB — FERRITIN: FERRITIN: 46 ng/mL (ref 11–307)

## 2015-04-20 NOTE — Progress Notes (Signed)
[facility]  [phone] [phone]  ID: Whitney [name] OB: Sep 17, 1960  MR#: [id number]  HGD#:[id number]  Patient Care Team: Lucille Passy, MD as PCP - General  CHIEF COMPLAINT:  Chief Complaint  Patient presents with  . Follow-up    INTERVAL HISTORY: Patient returns to clinic today for repeat laboratory work and further evaluation. She has mild weakness and fatigue, but otherwise feels well. She has no neurologic complaints. She denies any recent fevers. She denies any chest pain or shortness of breath. She denies any nausea, vomiting, constipation, or diarrhea. She has no melanoma or hematochezia. She has no urinary complaints. Patient offers no specific complaints today.  REVIEW OF SYSTEMS:   Review of Systems  Constitutional: Positive for malaise/fatigue. Negative for fever and weight loss.  Respiratory: Negative.  Negative for shortness of breath.   Cardiovascular: Negative.  Negative for chest pain.  Gastrointestinal: Negative.  Negative for blood in stool and melena.  Musculoskeletal: Negative.   Neurological: Positive for weakness.    As per HPI. Otherwise, a complete review of systems is negatve.  PAST MEDICAL HISTORY: Past Medical History  Diagnosis Date  . HLD (hyperlipidemia)   . Internal hemorrhoids without mention of complication   . Diverticulosis   . Congenital anomalies of foot, not elsewhere classified   . Displacement of intervertebral disc, site unspecified, without myelopathy   . Attention deficit disorder without mention of hyperactivity   . Gallstones   . Elevated blood pressure reading without diagnosis of hypertension   . Esophageal reflux   . Depression   . Anxiety   . Hypoglycemia, unspecified   . Other abnormal blood chemistry   . Hiatal hernia   . Barrett's esophagus   . Allergy     Seasonal  . Diabetes mellitus without complication     PAST SURGICAL HISTORY: Past Surgical History  Procedure  Laterality Date  . Cholecystectomy  06/16/2007    Dr. Hulen Skains  . Colonoscopy  02/06/2002    diverticula and internal hemorroids  . Cesarean section    . Breast cyst excision Left   . Lumbar laminectomy      L5-S1  . Hiatal hernia repair    . Cardiac catheterization      [facility]    FAMILY HISTORY Family History  Problem Relation Age of Onset  . Colon polyps Mother   . Hypertension Father   . Diabetes Father   . Heart disease Father   . Colon polyps Maternal Grandmother   . Heart attack Maternal Grandmother   . Prostate cancer Maternal Grandfather   . Heart attack Paternal Grandfather   . Colon cancer Paternal Grandmother   . Diabetes Maternal Aunt        ADVANCED DIRECTIVES:    HEALTH MAINTENANCE: Social History  Substance Use Topics  . Smoking status: Former Smoker    Types: Cigarettes  . Smokeless tobacco: Never Used     Comment: 2 years as a teenager  . Alcohol Use: 0.0 oz/week    0 Standard drinks or equivalent per week     Comment: occasional wine     Colonoscopy:  PAP:  Bone density:  Lipid panel:  Allergies  Allergen Reactions  . Sertraline Hcl     REACTION: Headache/ vision changes  . Zoloft [Sertraline Hcl]     Same as Sertraline    Current Outpatient Prescriptions  Medication Sig Dispense Refill  . Aspirin-Salicylamide-Caffeine (BC HEADACHE POWDER PO) Take 1 packet by mouth daily as  needed (pain).    . Blood Glucose Monitoring Suppl (FREESTYLE LITE) DEVI USE AS DIRECTED TO TEST BLOOD SUGAR  0  . cyclobenzaprine (FLEXERIL) 5 MG tablet Take 5 mg by mouth 3 (three) times daily as needed for muscle spasms.    Marland Kitchen esomeprazole (NEXIUM) 40 MG capsule Take 1 capsule (40 mg total) by mouth 2 (two) times daily. 60 capsule 6  . fluticasone (FLONASE) 50 MCG/ACT nasal spray Place 2 sprays into both nostrils daily as needed for allergies or rhinitis.    Marland Kitchen glucose blood test strip Use as instructed 100 each 12  . glucose monitoring kit (FREESTYLE) monitoring kit  1 each by Does not apply route as needed for other. Use as directe 1 each 0  . Lancets (FREESTYLE) lancets 1 each by Other route as needed for other. Use as instructed    . metFORMIN (GLUCOPHAGE) 500 MG tablet Take 1 tablet (500 mg total) by mouth 2 (two) times daily with a meal. 180 tablet 3  . NAPROXEN SODIUM PO Take 1 tablet by mouth 2 (two) times daily as needed.     . simvastatin (ZOCOR) 20 MG tablet Take 1 tablet (20 mg total) by mouth every evening. 90 tablet 1  . venlafaxine (EFFEXOR) 37.5 MG tablet TAKE 1 TABLET BY MOUTH EVERY EVENING 90 tablet 2  . Esomeprazole Magnesium (NEXIUM 24HR PO) Take 2 capsules by mouth daily. Reported on 04/16/2015    . ferrous sulfate 325 (65 FE) MG tablet Take 1 tablet (325 mg total) by mouth daily with breakfast. (Patient not taking: Reported on 04/16/2015) 30 tablet 3   No current facility-administered medications for this visit.    OBJECTIVE: Filed Vitals:   04/16/15 1350  BP: 126/87  Pulse: 97  Temp: 98.1 F (36.7 C)  Resp: 18     Body mass index is 32.79 kg/(m^2).    ECOG FS:0 - Asymptomatic  General: Well-developed, well-nourished, no acute distress. Eyes: Pink conjunctiva, anicteric sclera. Lungs: Clear to auscultation bilaterally. Heart: Regular rate and rhythm. No rubs, murmurs, or gallops. Abdomen: Soft, nontender, nondistended. No organomegaly noted, normoactive bowel sounds. Musculoskeletal: No edema, cyanosis, or clubbing. Neuro: Alert, answering all questions appropriately. Cranial nerves grossly intact. Skin: No rashes or petechiae noted. Psych: Normal affect.   LAB RESULTS:  Lab Results  Component Value Date   NA 141 12/30/2014   K 4.3 12/30/2014   CL 105 12/30/2014   CO2 28 12/30/2014   GLUCOSE 133* 12/30/2014   BUN 15 12/30/2014   CREATININE 0.70 12/30/2014   CALCIUM 9.4 12/30/2014   PROT 6.6 12/30/2014   ALBUMIN 3.8 12/30/2014   AST 22 12/30/2014   ALT 30 12/30/2014   ALKPHOS 97 12/30/2014   BILITOT 0.2  12/30/2014   GFRNONAA >90 06/02/2013   GFRAA >90 06/02/2013    Lab Results  Component Value Date   WBC 10.5 04/16/2015   NEUTROABS 7.0* 04/16/2015   HGB 15.2 04/16/2015   HCT 45.5 04/16/2015   MCV 90.4 04/16/2015   PLT 395 04/16/2015     STUDIES: No results found.  ASSESSMENT: Iron deficiency anemia.  PLAN:    1. Anemia: Patient's hemoglobin and iron stores are now within normal limits. She does not require additional Feraheme today. She last received Feraheme on November 11, 2014. No intervention is needed at this time. Return to clinic in 4 months with repeat laboratory work and further evaluation.  2. Hyperglycemia: Continue current diabetic medications. Treatment per PCP. 3. Thrombocytosis: Resolved.  Likely secondary  to iron deficiency.  Patient expressed understanding and was in agreement with this plan. She also understands that She can call clinic at any time with any questions, concerns, or complaints.    Lloyd Huger, MD   04/20/2015 10:34 AM

## 2015-05-05 ENCOUNTER — Encounter: Payer: Self-pay | Admitting: Family Medicine

## 2015-05-05 ENCOUNTER — Ambulatory Visit (INDEPENDENT_AMBULATORY_CARE_PROVIDER_SITE_OTHER): Payer: Federal, State, Local not specified - PPO | Admitting: Family Medicine

## 2015-05-05 VITALS — BP 120/74 | HR 97 | Temp 98.0°F | Wt 210.2 lb

## 2015-05-05 DIAGNOSIS — R140 Abdominal distension (gaseous): Secondary | ICD-10-CM | POA: Insufficient documentation

## 2015-05-05 DIAGNOSIS — R5383 Other fatigue: Secondary | ICD-10-CM | POA: Diagnosis not present

## 2015-05-05 LAB — COMPREHENSIVE METABOLIC PANEL
ALK PHOS: 97 U/L (ref 39–117)
ALT: 28 U/L (ref 0–35)
AST: 19 U/L (ref 0–37)
Albumin: 4 g/dL (ref 3.5–5.2)
BUN: 15 mg/dL (ref 6–23)
CO2: 27 meq/L (ref 19–32)
Calcium: 9.5 mg/dL (ref 8.4–10.5)
Chloride: 103 mEq/L (ref 96–112)
Creatinine, Ser: 0.78 mg/dL (ref 0.40–1.20)
GFR: 81.15 mL/min (ref >60.00)
GLUCOSE: 219 mg/dL — AB (ref 70–99)
POTASSIUM: 3.9 meq/L (ref 3.5–5.1)
Sodium: 138 mEq/L (ref 135–145)
Total Bilirubin: 0.3 mg/dL (ref 0.2–1.2)
Total Protein: 7 g/dL (ref 6.0–8.3)

## 2015-05-05 LAB — CBC WITH DIFFERENTIAL/PLATELET
Basophils Absolute: 0.1 10*3/uL (ref 0.0–0.1)
Basophils Relative: 0.6 % (ref 0.0–3.0)
EOS ABS: 0.1 10*3/uL (ref 0.0–0.7)
Eosinophils Relative: 1.3 % (ref 0.0–5.0)
HCT: 46.4 % — ABNORMAL HIGH (ref 36.0–46.0)
Hemoglobin: 15.5 g/dL — ABNORMAL HIGH (ref 12.0–15.0)
LYMPHS ABS: 2.2 10*3/uL (ref 0.7–4.0)
Lymphocytes Relative: 20 % (ref 12.0–46.0)
MCHC: 33.4 g/dL (ref 30.0–36.0)
MCV: 91.4 fl (ref 78.0–100.0)
MONO ABS: 0.7 10*3/uL (ref 0.1–1.0)
Monocytes Relative: 6.4 % (ref 3.0–12.0)
NEUTROS PCT: 71.7 % (ref 43.0–77.0)
Neutro Abs: 7.9 10*3/uL — ABNORMAL HIGH (ref 1.4–7.7)
PLATELETS: 375 10*3/uL (ref 150.0–400.0)
RBC: 5.07 Mil/uL (ref 3.87–5.11)
RDW: 13.3 % (ref 11.5–15.5)
WBC: 11 10*3/uL — AB (ref 4.0–10.5)

## 2015-05-05 LAB — LIPASE: LIPASE: 29 U/L (ref 11.0–59.0)

## 2015-05-05 LAB — TSH: TSH: 1.09 u[IU]/mL (ref 0.35–4.50)

## 2015-05-05 NOTE — Patient Instructions (Signed)
Great to see you. Please stop by to see Marion on your way out.   

## 2015-05-05 NOTE — Progress Notes (Signed)
Pre visit review using our clinic review tool, if applicable. No additional management support is needed unless otherwise documented below in the visit note. 

## 2015-05-05 NOTE — Assessment & Plan Note (Signed)
New with associated symptoms of fatigue and "just not feeling good." >25 minutes spent in face to face time with patient, >50% spent in counselling or coordination of care. We could certainly check [id number]- explained that this is not a great screening tool.  Reassured by relatively recent normal colonoscopy. Labs today, [place] of pelvis ordered. The patient indicates understanding of these issues and agrees with the plan. Orders Placed This Encounter  Procedures  . US Pelvis Complete  . US Transvaginal Non-OB  . CA 125  . CBC with Differential/Platelet  . Comprehensive metabolic panel  . TSH  . Hepatitis C Antibody  . Lipase

## 2015-05-05 NOTE — Progress Notes (Signed)
Subjective:   Patient ID: Whitney [name], female    DOB: [birth year], 55 y.o.   MRN: [id number]  [name] is a pleasant 55 y.o. year old female who presents to clinic today with Bloated  on 05/05/2015  HPI:  Past few months- progressive abdominal bloating. No nausea or vomiting. No changes in her bowel habits or blood in her stool.  Colonoscopy 8/25/16Olevia Perches- 10 year recall.  Recently had a friend diagnosed with ovarian CA and wants "blood test for this."  No h/o postmenopausal bleeding.  No known family history of ovarian, uterine, cervical or breast CA. Per pt, just had a pap smear done- GYN.  Current Outpatient Prescriptions on File Prior to Visit  Medication Sig Dispense Refill  . Aspirin-Salicylamide-Caffeine (BC HEADACHE POWDER PO) Take 1 packet by mouth daily as needed (pain).    . Blood Glucose Monitoring Suppl (FREESTYLE LITE) DEVI USE AS DIRECTED TO TEST BLOOD SUGAR  0  . cyclobenzaprine (FLEXERIL) 5 MG tablet Take 5 mg by mouth 3 (three) times daily as needed for muscle spasms.    Marland Kitchen esomeprazole (NEXIUM) 40 MG capsule Take 1 capsule (40 mg total) by mouth 2 (two) times daily. 60 capsule 6  . Esomeprazole Magnesium (NEXIUM 24HR PO) Take 2 capsules by mouth daily. Reported on 04/16/2015    . fluticasone (FLONASE) 50 MCG/ACT nasal spray Place 2 sprays into both nostrils daily as needed for allergies or rhinitis.    Marland Kitchen glucose blood test strip Use as instructed 100 each 12  . glucose monitoring kit (FREESTYLE) monitoring kit 1 each by Does not apply route as needed for other. Use as directe 1 each 0  . Lancets (FREESTYLE) lancets 1 each by Other route as needed for other. Use as instructed    . metFORMIN (GLUCOPHAGE) 500 MG tablet Take 1 tablet (500 mg total) by mouth 2 (two) times daily with a meal. 180 tablet 3  . NAPROXEN SODIUM PO Take 1 tablet by mouth 2 (two) times daily as needed.     . simvastatin (ZOCOR) 20 MG tablet Take 1 tablet (20 mg total) by mouth every evening.  90 tablet 1  . venlafaxine (EFFEXOR) 37.5 MG tablet TAKE 1 TABLET BY MOUTH EVERY EVENING 90 tablet 2   No current facility-administered medications on file prior to visit.    Allergies  Allergen Reactions  . Sertraline Hcl     REACTION: Headache/ vision changes  . Zoloft [Sertraline Hcl]     Same as Sertraline    Past Medical History  Diagnosis Date  . HLD (hyperlipidemia)   . Internal hemorrhoids without mention of complication   . Diverticulosis   . Congenital anomalies of foot, not elsewhere classified   . Displacement of intervertebral disc, site unspecified, without myelopathy   . Attention deficit disorder without mention of hyperactivity   . Gallstones   . Elevated blood pressure reading without diagnosis of hypertension   . Esophageal reflux   . Depression   . Anxiety   . Hypoglycemia, unspecified   . Other abnormal blood chemistry   . Hiatal hernia   . Barrett's esophagus   . Allergy     Seasonal  . Diabetes mellitus without complication [facility])     Past Surgical History  Procedure Laterality Date  . Cholecystectomy  06/16/2007    Dr. Hulen Skains  . Colonoscopy  02/06/2002    diverticula and internal hemorroids  . Cesarean section    . Breast cyst excision Left   .  Lumbar laminectomy      L5-S1  . Hiatal hernia repair    . Cardiac catheterization      [facility]    Family History  Problem Relation Age of Onset  . Colon polyps Mother   . Hypertension Father   . Diabetes Father   . Heart disease Father   . Colon polyps Maternal Grandmother   . Heart attack Maternal Grandmother   . Prostate cancer Maternal Grandfather   . Heart attack Paternal Grandfather   . Colon cancer Paternal Grandmother   . Diabetes Maternal Aunt     Social History   Social History  . Marital Status: Married    Spouse Name: N/A  . Number of Children: 2  . Years of Education: N/A   Occupational History  . 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 Smoking  status: Former Smoker    Types: Cigarettes  . Smokeless tobacco: Never Used     Comment: 2 years as a teenager  . Alcohol Use: 0.0 oz/week    0 Standard drinks or equivalent per week     Comment: occasional wine  . Drug Use: No  . Sexual Activity: Yes   Other Topics Concern  . Not on file   Social History Narrative   The PMH, PSH, Social History, Family History, Medications, and allergies have been reviewed in [facility], and have been updated if relevant.     Review of Systems  Constitutional: Positive for diaphoresis and fatigue. Negative for fever, activity change, appetite change and unexpected weight change.  HENT: Negative.   Respiratory: Negative.   Cardiovascular: Negative.   Gastrointestinal: Positive for abdominal distention. Negative for nausea, vomiting, abdominal pain, diarrhea, constipation, blood in stool, anal bleeding and rectal pain.  Endocrine: Negative.   Genitourinary: Negative.   Musculoskeletal: Negative.   Allergic/Immunologic: Negative.   Neurological: Negative.   Hematological: Negative.   Psychiatric/Behavioral: Negative.   All other systems reviewed and are negative.      Objective:    BP 120/74 mmHg  Pulse 97  Temp(Src) 98 F (36.7 C) (Oral)  Wt 210 lb 4 oz (95.369 kg)  SpO2 96%  LMP 06/17/2011 Wt Readings from Last 3 Encounters:  05/05/15 210 lb 4 oz (95.369 kg)  04/16/15 209 lb 7 oz (95 kg)  12/30/14 202 lb 12 oz (91.967 kg)     Physical Exam  Constitutional: She is oriented to person, place, and time. She appears well-developed and well-nourished. No distress.  HENT:  Head: Normocephalic and atraumatic.  Eyes: Conjunctivae are normal.  Cardiovascular: Normal rate.   Pulmonary/Chest: Effort normal.  Abdominal: Soft. Bowel sounds are normal. She exhibits distension. She exhibits no mass. There is no tenderness. There is no rebound and no guarding.  Musculoskeletal: Normal range of motion.  Neurological: She is alert and oriented to  person, place, and time. No cranial nerve deficit.  Skin: Skin is warm and dry. She is not diaphoretic.  Psychiatric: She has a normal mood and affect. Her behavior is normal. Judgment and thought content normal.  Nursing note and vitals reviewed.         Assessment & Plan:   Abdominal bloating - Plan: US Pelvis Complete, US Transvaginal Non-OB  Other fatigue - Plan: CA 125, CBC with Differential/Platelet, Comprehensive metabolic panel, TSH No Follow-up on file.

## 2015-05-06 LAB — CA 125: CA 125: 7 U/mL (ref <35)

## 2015-05-06 LAB — HEPATITIS C ANTIBODY: HCV AB: NEGATIVE

## 2015-05-08 ENCOUNTER — Other Ambulatory Visit: Payer: Self-pay | Admitting: Family Medicine

## 2015-05-08 ENCOUNTER — Ambulatory Visit
Admission: RE | Admit: 2015-05-08 | Discharge: 2015-05-08 | Disposition: A | Discharge status: Home / Self Care | Payer: Federal, State, Local not specified - PPO | Source: Ambulatory Visit | Attending: Family Medicine | Admitting: Family Medicine

## 2015-05-08 DIAGNOSIS — R938 Abnormal findings on diagnostic imaging of other specified body structures: Secondary | ICD-10-CM | POA: Insufficient documentation

## 2015-05-08 DIAGNOSIS — R140 Abdominal distension (gaseous): Secondary | ICD-10-CM | POA: Diagnosis present

## 2015-05-08 DIAGNOSIS — R9389 Abnormal findings on diagnostic imaging of other specified body structures: Secondary | ICD-10-CM

## 2015-06-13 ENCOUNTER — Encounter: Payer: Self-pay | Admitting: *Deleted

## 2015-06-30 ENCOUNTER — Ambulatory Visit: Payer: Federal, State, Local not specified - PPO | Admitting: Family Medicine

## 2015-08-07 ENCOUNTER — Other Ambulatory Visit: Payer: Self-pay | Admitting: Family Medicine

## 2015-08-07 ENCOUNTER — Encounter: Payer: Self-pay | Admitting: Family Medicine

## 2015-08-07 MED ORDER — TRIAMCINOLONE ACETONIDE 0.1 % EX CREA: 1.0000 "application " | TOPICAL_CREAM | Freq: Two times a day (BID) | CUTANEOUS | Status: DC

## 2015-08-07 NOTE — Telephone Encounter (Signed)
Med is not currently prescribed to pt

## 2015-08-14 ENCOUNTER — Inpatient Hospital Stay: Payer: Federal, State, Local not specified - PPO

## 2015-08-14 ENCOUNTER — Inpatient Hospital Stay (HOSPITAL_BASED_OUTPATIENT_CLINIC_OR_DEPARTMENT_OTHER): Payer: Federal, State, Local not specified - PPO | Admitting: Oncology

## 2015-08-14 ENCOUNTER — Inpatient Hospital Stay: Payer: Federal, State, Local not specified - PPO | Attending: Oncology

## 2015-08-14 VITALS — BP 125/92 | HR 93 | Temp 97.3°F | Resp 18 | Wt 207.7 lb

## 2015-08-14 DIAGNOSIS — Z800 Family history of malignant neoplasm of digestive organs: Secondary | ICD-10-CM

## 2015-08-14 DIAGNOSIS — Z8719 Personal history of other diseases of the digestive system: Secondary | ICD-10-CM | POA: Diagnosis not present

## 2015-08-14 DIAGNOSIS — Z79899 Other long term (current) drug therapy: Secondary | ICD-10-CM | POA: Insufficient documentation

## 2015-08-14 DIAGNOSIS — Z7984 Long term (current) use of oral hypoglycemic drugs: Secondary | ICD-10-CM

## 2015-08-14 DIAGNOSIS — K219 Gastro-esophageal reflux disease without esophagitis: Secondary | ICD-10-CM | POA: Insufficient documentation

## 2015-08-14 DIAGNOSIS — F988 Other specified behavioral and emotional disorders with onset usually occurring in childhood and adolescence: Secondary | ICD-10-CM | POA: Diagnosis not present

## 2015-08-14 DIAGNOSIS — R531 Weakness: Secondary | ICD-10-CM | POA: Diagnosis not present

## 2015-08-14 DIAGNOSIS — D509 Iron deficiency anemia, unspecified: Secondary | ICD-10-CM

## 2015-08-14 DIAGNOSIS — R5383 Other fatigue: Secondary | ICD-10-CM

## 2015-08-14 DIAGNOSIS — K8080 Other cholelithiasis without obstruction: Secondary | ICD-10-CM | POA: Diagnosis not present

## 2015-08-14 DIAGNOSIS — K449 Diaphragmatic hernia without obstruction or gangrene: Secondary | ICD-10-CM | POA: Diagnosis not present

## 2015-08-14 DIAGNOSIS — Z808 Family history of malignant neoplasm of other organs or systems: Secondary | ICD-10-CM | POA: Diagnosis not present

## 2015-08-14 DIAGNOSIS — E785 Hyperlipidemia, unspecified: Secondary | ICD-10-CM

## 2015-08-14 DIAGNOSIS — E1165 Type 2 diabetes mellitus with hyperglycemia: Secondary | ICD-10-CM | POA: Diagnosis not present

## 2015-08-14 DIAGNOSIS — Z87891 Personal history of nicotine dependence: Secondary | ICD-10-CM | POA: Insufficient documentation

## 2015-08-14 DIAGNOSIS — F418 Other specified anxiety disorders: Secondary | ICD-10-CM | POA: Insufficient documentation

## 2015-08-14 DIAGNOSIS — K2270 Barrett's esophagus without dysplasia: Secondary | ICD-10-CM | POA: Diagnosis not present

## 2015-08-14 LAB — FERRITIN: Ferritin: 40 ng/mL (ref 11–307)

## 2015-08-14 LAB — CBC WITH DIFFERENTIAL/PLATELET
BASOS PCT: 1 %
Basophils Absolute: 0.1 10*3/uL (ref 0–0.1)
EOS ABS: 0.3 10*3/uL (ref 0–0.7)
Eosinophils Relative: 3 %
HCT: 45.4 % (ref 35.0–47.0)
Hemoglobin: 15.4 g/dL (ref 12.0–16.0)
Lymphocytes Relative: 22 %
Lymphs Abs: 2.2 10*3/uL (ref 1.0–3.6)
MCH: 30.3 pg (ref 26.0–34.0)
MCHC: 33.9 g/dL (ref 32.0–36.0)
MCV: 89.3 fL (ref 80.0–100.0)
MONO ABS: 0.8 10*3/uL (ref 0.2–0.9)
MONOS PCT: 8 %
Neutro Abs: 6.8 10*3/uL — ABNORMAL HIGH (ref 1.4–6.5)
Neutrophils Relative %: 66 %
PLATELETS: 341 10*3/uL (ref 150–440)
RBC: 5.08 MIL/uL (ref 3.80–5.20)
RDW: 13.2 % (ref 11.5–14.5)
WBC: 10.2 10*3/uL (ref 3.6–11.0)

## 2015-08-14 LAB — IRON AND TIBC
IRON: 69 ug/dL (ref 28–170)
Saturation Ratios: 17 % (ref 10.4–31.8)
TIBC: 405 ug/dL (ref 250–450)
UIBC: 336 ug/dL

## 2015-08-14 NOTE — Progress Notes (Signed)
Patient is feeling tired.

## 2015-08-18 NOTE — Progress Notes (Signed)
[place]  Telephone:(336) [phone] [phone]) [phone]  ID: Clent Ridges OB: 09-11-1960  MR#: [id number]  JGO#:[id number]  Patient Care Team: Lucille Passy, MD as PCP - General  CHIEF COMPLAINT:  Chief Complaint  Patient presents with  . Anemia    INTERVAL HISTORY: Patient returns to clinic today for repeat laboratory work and further evaluation. She has mild weakness and fatigue, but otherwise feels well. She has no neurologic complaints. She denies any recent fevers. She denies any chest pain or shortness of breath. She denies any nausea, vomiting, constipation, or diarrhea. She has no melanoma or hematochezia. She has no urinary complaints. Patient offers no further specific complaints today.  REVIEW OF SYSTEMS:   Review of Systems  Constitutional: Positive for malaise/fatigue. Negative for fever and weight loss.  Respiratory: Negative.  Negative for shortness of breath.   Cardiovascular: Negative.  Negative for chest pain.  Gastrointestinal: Negative.  Negative for blood in stool and melena.  Musculoskeletal: Negative.   Neurological: Positive for weakness.    As per HPI. Otherwise, a complete review of systems is negatve.  PAST MEDICAL HISTORY: Past Medical History  Diagnosis Date  . HLD (hyperlipidemia)   . Internal hemorrhoids without mention of complication   . Diverticulosis   . Congenital anomalies of foot, not elsewhere classified   . Displacement of intervertebral disc, site unspecified, without myelopathy   . Attention deficit disorder without mention of hyperactivity   . Gallstones   . Elevated blood pressure reading without diagnosis of hypertension   . Esophageal reflux   . Depression   . Anxiety   . Hypoglycemia, unspecified   . Other abnormal blood chemistry   . Hiatal hernia   . Barrett's esophagus   . Allergy     Seasonal  . Diabetes mellitus without complication ([place])     PAST SURGICAL HISTORY: Past Surgical History    Procedure Laterality Date  . Cholecystectomy  06/16/2007    Dr. Hulen Skains  . Colonoscopy  02/06/2002    diverticula and internal hemorroids  . Cesarean section    . Breast cyst excision Left   . Lumbar laminectomy      L5-S1  . Hiatal hernia repair    . Cardiac catheterization      [facility]    FAMILY HISTORY Family History  Problem Relation Age of Onset  . Colon polyps Mother   . Hypertension Father   . Diabetes Father   . Heart disease Father   . Colon polyps Maternal Grandmother   . Heart attack Maternal Grandmother   . Prostate cancer Maternal Grandfather   . Heart attack Paternal Grandfather   . Colon cancer Paternal Grandmother   . Diabetes Maternal Aunt        ADVANCED DIRECTIVES:    HEALTH MAINTENANCE: Social History  Substance Use Topics  . Smoking status: Former Smoker    Types: Cigarettes  . Smokeless tobacco: Never Used     Comment: 2 years as a teenager  . Alcohol Use: 0.0 oz/week    0 Standard drinks or equivalent per week     Comment: occasional wine     Colonoscopy:  PAP:  Bone density:  Lipid panel:  Allergies  Allergen Reactions  . Sertraline Hcl     REACTION: Headache/ vision changes  . Zoloft [Sertraline Hcl]     Same as Sertraline    Current Outpatient Prescriptions  Medication Sig Dispense Refill  . Aspirin-Salicylamide-Caffeine (BC HEADACHE POWDER PO) Take 1 packet by  mouth daily as needed (pain).    . Blood Glucose Monitoring Suppl (FREESTYLE LITE) DEVI USE AS DIRECTED TO TEST BLOOD SUGAR  0  . cyclobenzaprine (FLEXERIL) 5 MG tablet Take 5 mg by mouth 3 (three) times daily as needed for muscle spasms.    . Esomeprazole Magnesium (NEXIUM 24HR PO) Take 2 capsules by mouth daily. Reported on 04/16/2015    . fluticasone (FLONASE) 50 MCG/ACT nasal spray Place 2 sprays into both nostrils daily as needed for allergies or rhinitis.    Marland Kitchen glucose blood test strip Use as instructed 100 each 12  . glucose monitoring kit (FREESTYLE) monitoring kit  1 each by Does not apply route as needed for other. Use as directe 1 each 0  . Lancets (FREESTYLE) lancets 1 each by Other route as needed for other. Use as instructed    . metFORMIN (GLUCOPHAGE) 500 MG tablet Take 1 tablet (500 mg total) by mouth 2 (two) times daily with a meal. 180 tablet 3  . NAPROXEN SODIUM PO Take 1 tablet by mouth 2 (two) times daily as needed.     . simvastatin (ZOCOR) 20 MG tablet Take 1 tablet (20 mg total) by mouth every evening. 90 tablet 1  . triamcinolone cream (KENALOG) 0.1 % Apply 1 application topically 2 (two) times daily. 30 g 1  . venlafaxine (EFFEXOR) 37.5 MG tablet TAKE 1 TABLET BY MOUTH EVERY EVENING 90 tablet 2   No current facility-administered medications for this visit.    OBJECTIVE: Filed Vitals:   08/14/15 1428  BP: 125/92  Pulse: 93  Temp: 97.3 F (36.3 C)  Resp: 18     Body mass index is 32.52 kg/(m^2).    ECOG FS:0 - Asymptomatic  General: Well-developed, well-nourished, no acute distress. Eyes: Pink conjunctiva, anicteric sclera. Lungs: Clear to auscultation bilaterally. Heart: Regular rate and rhythm. No rubs, murmurs, or gallops. Abdomen: Soft, nontender, nondistended. No organomegaly noted, normoactive bowel sounds. Musculoskeletal: No edema, cyanosis, or clubbing. Neuro: Alert, answering all questions appropriately. Cranial nerves grossly intact. Skin: No rashes or petechiae noted. Psych: Normal affect.   LAB RESULTS:  Lab Results  Component Value Date   NA 138 05/05/2015   K 3.9 05/05/2015   CL 103 05/05/2015   CO2 27 05/05/2015   GLUCOSE 219* 05/05/2015   BUN 15 05/05/2015   CREATININE 0.78 05/05/2015   CALCIUM 9.5 05/05/2015   PROT 7.0 05/05/2015   ALBUMIN 4.0 05/05/2015   AST 19 05/05/2015   ALT 28 05/05/2015   ALKPHOS 97 05/05/2015   BILITOT 0.3 05/05/2015   GFRNONAA >90 06/02/2013   GFRAA >90 06/02/2013    Lab Results  Component Value Date   WBC 10.2 08/14/2015   NEUTROABS 6.8* 08/14/2015   HGB 15.4  08/14/2015   HCT 45.4 08/14/2015   MCV 89.3 08/14/2015   PLT 341 08/14/2015   Lab Results  Component Value Date   FERRITIN 40 08/14/2015   Lab Results  Component Value Date   IRON 69 08/14/2015   TIBC 405 08/14/2015   IRONPCTSAT 17 08/14/2015      STUDIES: No results found.  ASSESSMENT: Iron deficiency anemia.  PLAN:    1. Anemia: Patient's hemoglobin and iron stores continue to be within normal limits. She does not require additional Feraheme today. She last received Feraheme on November 11, 2014. No intervention is needed at this time. Return to clinic in 3 months with repeat laboratory work and further evaluation. If her hemoglobin and iron stores remain normal  at that time, she likely can be discharged from clinic. 2. Hyperglycemia: Continue current diabetic medications. Treatment per PCP. 3. Thrombocytosis: Resolved.  Likely secondary to iron deficiency.  Patient expressed understanding and was in agreement with this plan. She also understands that She can call clinic at any time with any questions, concerns, or complaints.    Lloyd Huger, MD   08/18/2015 4:56 PM

## 2015-09-02 DIAGNOSIS — R9349 Abnormal radiologic findings on diagnostic imaging of other urinary organs: Secondary | ICD-10-CM | POA: Diagnosis not present

## 2015-09-02 DIAGNOSIS — R938 Abnormal findings on diagnostic imaging of other specified body structures: Secondary | ICD-10-CM | POA: Diagnosis not present

## 2015-09-20 ENCOUNTER — Other Ambulatory Visit: Payer: Self-pay | Admitting: Family Medicine

## 2015-09-22 NOTE — Telephone Encounter (Signed)
Last A1C abnormal. pls advise

## 2015-10-14 ENCOUNTER — Ambulatory Visit (INDEPENDENT_AMBULATORY_CARE_PROVIDER_SITE_OTHER): Payer: Federal, State, Local not specified - PPO | Admitting: Family Medicine

## 2015-10-14 ENCOUNTER — Encounter: Payer: Self-pay | Admitting: Family Medicine

## 2015-10-14 ENCOUNTER — Other Ambulatory Visit: Payer: Self-pay | Admitting: Family Medicine

## 2015-10-14 VITALS — BP 128/72 | HR 68 | Temp 98.0°F | Wt 204.0 lb

## 2015-10-14 DIAGNOSIS — R42 Dizziness and giddiness: Secondary | ICD-10-CM | POA: Diagnosis not present

## 2015-10-14 DIAGNOSIS — E119 Type 2 diabetes mellitus without complications: Secondary | ICD-10-CM

## 2015-10-14 DIAGNOSIS — R519 Headache, unspecified: Secondary | ICD-10-CM

## 2015-10-14 DIAGNOSIS — R51 Headache: Secondary | ICD-10-CM | POA: Diagnosis not present

## 2015-10-14 DIAGNOSIS — R5383 Other fatigue: Secondary | ICD-10-CM

## 2015-10-14 LAB — COMPREHENSIVE METABOLIC PANEL
ALT: 28 U/L (ref 0–35)
AST: 21 U/L (ref 0–37)
Albumin: 4.3 g/dL (ref 3.5–5.2)
Alkaline Phosphatase: 89 U/L (ref 39–117)
BILIRUBIN TOTAL: 0.3 mg/dL (ref 0.2–1.2)
BUN: 17 mg/dL (ref 6–23)
CHLORIDE: 102 meq/L (ref 96–112)
CO2: 30 meq/L (ref 19–32)
CREATININE: 0.82 mg/dL (ref 0.40–1.20)
Calcium: 9.8 mg/dL (ref 8.4–10.5)
GFR: 76.48 mL/min (ref >60.00)
GLUCOSE: 167 mg/dL — AB (ref 70–99)
Potassium: 4.6 mEq/L (ref 3.5–5.1)
SODIUM: 139 meq/L (ref 135–145)
Total Protein: 7.3 g/dL (ref 6.0–8.3)

## 2015-10-14 LAB — HEMOGLOBIN A1C: HEMOGLOBIN A1C: 7.5 % — AB (ref 4.6–6.5)

## 2015-10-14 LAB — VITAMIN D 25 HYDROXY (VIT D DEFICIENCY, FRACTURES): VITD: 37.96 ng/mL (ref 30.00–100.00)

## 2015-10-14 LAB — VITAMIN B12: Vitamin B-12: 573 pg/mL (ref 211–911)

## 2015-10-14 LAB — TSH: TSH: 1.01 u[IU]/mL (ref 0.35–4.50)

## 2015-10-14 NOTE — Assessment & Plan Note (Signed)
New- progressive with fatigue, dizziness. Advised getting a dilated eye exam- ? Need new prescription glasses, rule out diabetic retinopathy. Labs and carotid dopplers as initial work up. The patient indicates understanding of these issues and agrees with the plan. Orders Placed This Encounter  Procedures  . US Carotid Duplex Bilateral    Standing Status: Future     Number of Occurrences:      Standing Expiration Date: 12/14/2016    Order Specific Question:  Reason for exam:    Answer:  dizziness, headaches    Order Specific Question:  Preferred imaging location?    Answer:  Internal  . CBC with Differential/Platelet  . Comprehensive metabolic panel  . TSH  . Hemoglobin A1c  . Vitamin B12  . Vitamin D, 25-hydroxy

## 2015-10-14 NOTE — Patient Instructions (Signed)
Good to see you. I will call you with your lab results.  Please stop by to see Marion on your way out. 

## 2015-10-14 NOTE — Addendum Note (Signed)
Addended by: Modena Nunnery on: 10/14/2015 11:11 AM   Modules accepted: Orders

## 2015-10-14 NOTE — Progress Notes (Addendum)
   Subjective:   Patient ID: Whitney [name], female    DOB: [birth year], 55 y.o.   MRN: [id number]  Whitney [name] is a pleasant 55 y.o. year old female who presents to clinic today with Headache; Fatigue; and Altered Mental Status  on 10/14/2015  HPI:  Past three weeks, multiple progressive symptoms. Feels tired, dizzy/swimmy headaches,  daily fontal "dull" headaches and confusion.  Describes confusion as an inability to focus.  No other signs of AMS.  No other focal neurological deficits.  Admits to not monitoring her blood sugars.  No CP or SOB. Sleeping ok.   Lab Results  Component Value Date   HGBA1C 7.5* 12/30/2014      Lab Results  Component Value Date   WBC 10.2 08/14/2015   HGB 15.4 08/14/2015   HCT 45.4 08/14/2015   MCV 89.3 08/14/2015   PLT 341 08/14/2015   Lab Results  Component Value Date   TSH 1.09 05/05/2015   Lab Results  Component Value Date   NA 138 05/05/2015   K 3.9 05/05/2015   CL 103 05/05/2015   CO2 27 05/05/2015     Review of Systems  Constitutional: Positive for fatigue. Negative for fever and unexpected weight change.  Eyes: Negative for visual disturbance.  Respiratory: Negative.   Cardiovascular: Negative.   Genitourinary: Negative.   Musculoskeletal: Negative.   Skin: Negative.   Neurological: Positive for dizziness, light-headedness and headaches. Negative for tremors, seizures, syncope, facial asymmetry, speech difficulty, weakness and numbness.  Psychiatric/Behavioral: Positive for decreased concentration. Negative for suicidal ideas, hallucinations, sleep disturbance, self-injury and dysphoric mood. The patient is not nervous/anxious and is not hyperactive.   All other systems reviewed and are negative.      Objective:    BP 128/72 mmHg  Pulse 68  Temp(Src) 98 F (36.7 C) (Oral)  Wt 204 lb (92.534 kg)  SpO2 95%  LMP 06/17/2011   Physical Exam  Constitutional: She is oriented to person, place, and time. She appears  well-developed and well-nourished. No distress.  HENT:  Head: Normocephalic and atraumatic.  Eyes: EOM are normal.  Neck: Neck supple.  Cardiovascular: Normal rate and regular rhythm.   No bruits  Pulmonary/Chest: Effort normal and breath sounds normal.  Musculoskeletal: Normal range of motion. She exhibits no edema.  Neurological: She is alert and oriented to person, place, and time. She has normal reflexes. She displays normal reflexes. No cranial nerve deficit. She exhibits normal muscle tone. Coordination normal.  Skin: Skin is warm and dry. She is not diaphoretic.  Psychiatric: She has a normal mood and affect. Her behavior is normal. Judgment and thought content normal.  Nursing note and vitals reviewed.         Assessment & Plan:   Frequent headaches - Plan: CBC with Differential/Platelet, Comprehensive metabolic panel, TSH, Hemoglobin A1c, Vitamin B12, Vitamin D, 25-hydroxy  New onset type 2 diabetes mellitus ([place])  Other fatigue No Follow-up on file.

## 2015-10-14 NOTE — Progress Notes (Signed)
Pre visit review using our clinic review tool, if applicable. No additional management support is needed unless otherwise documented below in the visit note. 

## 2015-10-15 LAB — CBC WITH DIFFERENTIAL/PLATELET
BASOS PCT: 0.5 % (ref 0.0–3.0)
Basophils Absolute: 0 10*3/uL (ref 0.0–0.1)
EOS PCT: 2.5 % (ref 0.0–5.0)
Eosinophils Absolute: 0.2 10*3/uL (ref 0.0–0.7)
HCT: 47.3 % — ABNORMAL HIGH (ref 36.0–46.0)
Hemoglobin: 15.8 g/dL — ABNORMAL HIGH (ref 12.0–15.0)
LYMPHS ABS: 1.9 10*3/uL (ref 0.7–4.0)
Lymphocytes Relative: 21.9 % (ref 12.0–46.0)
MCHC: 33.3 g/dL (ref 30.0–36.0)
MCV: 91 fl (ref 78.0–100.0)
MONO ABS: 0.5 10*3/uL (ref 0.1–1.0)
MONOS PCT: 6.2 % (ref 3.0–12.0)
NEUTROS ABS: 5.9 10*3/uL (ref 1.4–7.7)
NEUTROS PCT: 68.9 % (ref 43.0–77.0)
Platelets: 388 10*3/uL (ref 150.0–400.0)
RBC: 5.2 Mil/uL — ABNORMAL HIGH (ref 3.87–5.11)
RDW: 13.2 % (ref 11.5–15.5)
WBC: 8.5 10*3/uL (ref 4.0–10.5)

## 2015-10-17 ENCOUNTER — Other Ambulatory Visit: Payer: Self-pay | Admitting: Family Medicine

## 2015-10-17 ENCOUNTER — Ambulatory Visit (HOSPITAL_COMMUNITY)
Admission: RE | Admit: 2015-10-17 | Discharge: 2015-10-17 | Disposition: A | Discharge status: Home / Self Care | Payer: Federal, State, Local not specified - PPO | Source: Ambulatory Visit | Attending: Cardiology | Admitting: Cardiology

## 2015-10-17 DIAGNOSIS — E119 Type 2 diabetes mellitus without complications: Secondary | ICD-10-CM | POA: Insufficient documentation

## 2015-10-17 DIAGNOSIS — R42 Dizziness and giddiness: Secondary | ICD-10-CM | POA: Diagnosis not present

## 2015-10-17 DIAGNOSIS — F329 Major depressive disorder, single episode, unspecified: Secondary | ICD-10-CM | POA: Insufficient documentation

## 2015-10-17 DIAGNOSIS — K219 Gastro-esophageal reflux disease without esophagitis: Secondary | ICD-10-CM | POA: Insufficient documentation

## 2015-10-17 DIAGNOSIS — E785 Hyperlipidemia, unspecified: Secondary | ICD-10-CM | POA: Diagnosis not present

## 2015-10-17 DIAGNOSIS — F419 Anxiety disorder, unspecified: Secondary | ICD-10-CM | POA: Insufficient documentation

## 2015-10-17 DIAGNOSIS — I6523 Occlusion and stenosis of bilateral carotid arteries: Secondary | ICD-10-CM

## 2015-10-20 ENCOUNTER — Encounter: Payer: Self-pay | Admitting: Family Medicine

## 2015-10-24 ENCOUNTER — Other Ambulatory Visit: Payer: Self-pay | Admitting: Family Medicine

## 2015-10-24 LAB — HM DIABETES EYE EXAM

## 2015-10-27 ENCOUNTER — Other Ambulatory Visit: Payer: Self-pay | Admitting: Family Medicine

## 2015-10-27 NOTE — Telephone Encounter (Signed)
#  30 x 0 last filled 01/07/2014. Pt's last ov was 10/14/15, no future appt's scheduled. Ok to refill?

## 2015-11-13 ENCOUNTER — Other Ambulatory Visit: Payer: Self-pay | Admitting: Family Medicine

## 2015-11-17 NOTE — Progress Notes (Signed)
[place]  Telephone:(336) [phone] Fax:(336) [phone]  ID: Whitney [name] OB: 08-08-1960  MR#: [id number]  WPY#:[id number]  Patient Care Team: Lucille Passy, MD as PCP - General  CHIEF COMPLAINT:  Iron deficiency anemia.  INTERVAL HISTORY: Patient returns to clinic today for repeat laboratory work and further evaluation. She currently feels well and is asymptomatic. She does not complain of weakness and fatigue today. She has no neurologic complaints. She denies any recent fevers. She denies any chest pain or shortness of breath. She denies any nausea, vomiting, constipation, or diarrhea. She has no melanoma or hematochezia. She has no urinary complaints. Patient offers no  specific complaints today.  REVIEW OF SYSTEMS:   Review of Systems  Constitutional: Negative for fever, malaise/fatigue and weight loss.  Respiratory: Negative.  Negative for cough and shortness of breath.   Cardiovascular: Negative.  Negative for chest pain.  Gastrointestinal: Negative.  Negative for blood in stool and melena.  Musculoskeletal: Negative.   Neurological: Negative.  Negative for weakness.  Psychiatric/Behavioral: Negative.     As per HPI. Otherwise, a complete review of systems is negatve.  PAST MEDICAL HISTORY: Past Medical History:  Diagnosis Date  . Allergy    Seasonal  . Anxiety   . Attention deficit disorder without mention of hyperactivity   . Barrett's esophagus   . Congenital anomalies of foot, not elsewhere classified   . Depression   . Diabetes mellitus without complication ([place])   . Displacement of intervertebral disc, site unspecified, without myelopathy   . Diverticulosis   . Elevated blood pressure reading without diagnosis of hypertension   . Esophageal reflux   . Gallstones   . Hiatal hernia   . HLD (hyperlipidemia)   . Hypoglycemia, unspecified   . Internal hemorrhoids without mention of complication   . Other abnormal blood chemistry     PAST  SURGICAL HISTORY: Past Surgical History:  Procedure Laterality Date  . BREAST CYST EXCISION Left   . CARDIAC CATHETERIZATION     [facility]  . CESAREAN SECTION    . CHOLECYSTECTOMY  06/16/2007   Dr. Hulen Skains  . COLONOSCOPY  02/06/2002   diverticula and internal hemorroids  . HIATAL HERNIA REPAIR    . LUMBAR LAMINECTOMY     L5-S1    FAMILY HISTORY Family History  Problem Relation Age of Onset  . Colon polyps Mother   . Hypertension Father   . Diabetes Father   . Heart disease Father   . Colon polyps Maternal Grandmother   . Heart attack Maternal Grandmother   . Prostate cancer Maternal Grandfather   . Heart attack Paternal Grandfather   . Colon cancer Paternal Grandmother   . Diabetes Maternal Aunt        ADVANCED DIRECTIVES:    HEALTH MAINTENANCE: Social History  Substance Use Topics  . Smoking status: Former Smoker    Types: Cigarettes  . Smokeless tobacco: Never Used     Comment: 2 years as a teenager  . Alcohol use 0.0 oz/week     Comment: occasional wine     Colonoscopy:  PAP:  Bone density:  Lipid panel:  Allergies  Allergen Reactions  . Sertraline Hcl     REACTION: Headache/ vision changes  . Zoloft [Sertraline Hcl]     Same as Sertraline    Current Outpatient Prescriptions  Medication Sig Dispense Refill  . Aspirin-Salicylamide-Caffeine (BC HEADACHE POWDER PO) Take 1 packet by mouth daily as needed (pain).    . Blood Glucose Monitoring  Suppl (FREESTYLE LITE) DEVI USE AS DIRECTED TO TEST BLOOD SUGAR  0  . cyclobenzaprine (FLEXERIL) 5 MG tablet TAKE 1 TABLET (5 MG TOTAL) BY MOUTH 3 (THREE) TIMES DAILY AS NEEDED FOR MUSCLE SPASMS. 30 tablet 1  . Esomeprazole Magnesium (NEXIUM 24HR PO) Take 2 capsules by mouth daily. Reported on 04/16/2015    . fluticasone (FLONASE) 50 MCG/ACT nasal spray Place 2 sprays into both nostrils daily as needed for allergies or rhinitis.    Marland Kitchen FREESTYLE LITE test strip USE AS INSTRUCTED 100 each 3  . glucose monitoring kit  (FREESTYLE) monitoring kit 1 each by Does not apply route as needed for other. Use as directe 1 each 0  . Lancets (FREESTYLE) lancets 1 each by Other route as needed for other. Use as instructed    . medroxyPROGESTERone (PROVERA) 10 MG tablet TAKE 1 TABLET BY MOUTH DAILY FOR 10 CONSECUTIVE DAYS EACH MONTH  6  . metFORMIN (GLUCOPHAGE) 500 MG tablet TAKE 1 TABLET (500 MG TOTAL) BY MOUTH 2 (TWO) TIMES DAILY WITH A MEAL. 180 tablet 3  . NAPROXEN SODIUM PO Take 1 tablet by mouth 2 (two) times daily as needed.     . simvastatin (ZOCOR) 20 MG tablet Take 1 tablet (20 mg total) by mouth every evening. 90 tablet 1  . triamcinolone cream (KENALOG) 0.1 % Apply 1 application topically 2 (two) times daily. 30 g 1  . venlafaxine (EFFEXOR) 37.5 MG tablet TAKE 1 TABLET BY MOUTH EVERY EVENING 90 tablet 2   No current facility-administered medications for this visit.     OBJECTIVE: Vitals:   11/18/15 1419  BP: 124/88  Pulse: 90  Resp: 18  Temp: 98.3 F (36.8 C)     Body mass index is 32.15 kg/m.    ECOG FS:0 - Asymptomatic  General: Well-developed, well-nourished, no acute distress. Eyes: Pink conjunctiva, anicteric sclera. Lungs: Clear to auscultation bilaterally. Heart: Regular rate and rhythm. No rubs, murmurs, or gallops. Abdomen: Soft, nontender, nondistended. No organomegaly noted, normoactive bowel sounds. Musculoskeletal: No edema, cyanosis, or clubbing. Neuro: Alert, answering all questions appropriately. Cranial nerves grossly intact. Skin: No rashes or petechiae noted. Psych: Normal affect.   LAB RESULTS:  Lab Results  Component Value Date   NA 139 10/14/2015   K 4.6 10/14/2015   CL 102 10/14/2015   CO2 30 10/14/2015   GLUCOSE 167 (H) 10/14/2015   BUN 17 10/14/2015   CREATININE 0.82 10/14/2015   CALCIUM 9.8 10/14/2015   PROT 7.3 10/14/2015   ALBUMIN 4.3 10/14/2015   AST 21 10/14/2015   ALT 28 10/14/2015   ALKPHOS 89 10/14/2015   BILITOT 0.3 10/14/2015   GFRNONAA >90  06/02/2013   GFRAA >90 06/02/2013    Lab Results  Component Value Date   WBC 9.5 11/18/2015   NEUTROABS 6.2 11/18/2015   HGB 13.6 11/18/2015   HCT 39.3 11/18/2015   MCV 88.9 11/18/2015   PLT 359 11/18/2015   Lab Results  Component Value Date   FERRITIN 27 11/18/2015   Lab Results  Component Value Date   IRON 47 11/18/2015   TIBC 401 11/18/2015   IRONPCTSAT 12 11/18/2015     STUDIES: No results found.  ASSESSMENT: Iron deficiency anemia.  PLAN:    1.  Iron deficiency anemia: Patient's hemoglobin and iron stores continue to be within normal limits. She does not require additional Feraheme today. She last received Feraheme on November 11, 2014. No intervention is needed at this time. After discussion with the patient,  not further follow up is necessary. Please refer patient back if her hemoglobin declines or she becomes symptomatic. 2. Hyperglycemia: Continue current diabetic medications. Treatment per PCP. 3. Thrombocytosis: Resolved.  Likely secondary to iron deficiency.  Patient expressed understanding and was in agreement with this plan. She also understands that She can call clinic at any time with any questions, concerns, or complaints.    Lloyd Huger, MD   11/18/2015 10:13 PM

## 2015-11-18 ENCOUNTER — Inpatient Hospital Stay: Payer: Federal, State, Local not specified - PPO | Attending: Oncology | Admitting: Oncology

## 2015-11-18 ENCOUNTER — Inpatient Hospital Stay: Payer: Federal, State, Local not specified - PPO

## 2015-11-18 VITALS — BP 124/88 | HR 90 | Temp 98.3°F | Resp 18 | Wt 205.2 lb

## 2015-11-18 DIAGNOSIS — D509 Iron deficiency anemia, unspecified: Secondary | ICD-10-CM | POA: Insufficient documentation

## 2015-11-18 DIAGNOSIS — I10 Essential (primary) hypertension: Secondary | ICD-10-CM | POA: Diagnosis not present

## 2015-11-18 DIAGNOSIS — K2270 Barrett's esophagus without dysplasia: Secondary | ICD-10-CM | POA: Diagnosis not present

## 2015-11-18 DIAGNOSIS — E785 Hyperlipidemia, unspecified: Secondary | ICD-10-CM | POA: Diagnosis not present

## 2015-11-18 DIAGNOSIS — K5790 Diverticulosis of intestine, part unspecified, without perforation or abscess without bleeding: Secondary | ICD-10-CM | POA: Diagnosis not present

## 2015-11-18 DIAGNOSIS — F419 Anxiety disorder, unspecified: Secondary | ICD-10-CM | POA: Insufficient documentation

## 2015-11-18 DIAGNOSIS — D473 Essential (hemorrhagic) thrombocythemia: Secondary | ICD-10-CM | POA: Diagnosis not present

## 2015-11-18 DIAGNOSIS — Z87891 Personal history of nicotine dependence: Secondary | ICD-10-CM | POA: Diagnosis not present

## 2015-11-18 DIAGNOSIS — E119 Type 2 diabetes mellitus without complications: Secondary | ICD-10-CM | POA: Diagnosis not present

## 2015-11-18 DIAGNOSIS — Z7984 Long term (current) use of oral hypoglycemic drugs: Secondary | ICD-10-CM | POA: Diagnosis not present

## 2015-11-18 DIAGNOSIS — K219 Gastro-esophageal reflux disease without esophagitis: Secondary | ICD-10-CM

## 2015-11-18 DIAGNOSIS — F329 Major depressive disorder, single episode, unspecified: Secondary | ICD-10-CM | POA: Insufficient documentation

## 2015-11-18 DIAGNOSIS — R7989 Other specified abnormal findings of blood chemistry: Secondary | ICD-10-CM | POA: Insufficient documentation

## 2015-11-18 DIAGNOSIS — G5790 Unspecified mononeuropathy of unspecified lower limb: Secondary | ICD-10-CM

## 2015-11-18 DIAGNOSIS — Z8042 Family history of malignant neoplasm of prostate: Secondary | ICD-10-CM | POA: Insufficient documentation

## 2015-11-18 DIAGNOSIS — K8080 Other cholelithiasis without obstruction: Secondary | ICD-10-CM | POA: Diagnosis not present

## 2015-11-18 DIAGNOSIS — R739 Hyperglycemia, unspecified: Secondary | ICD-10-CM | POA: Insufficient documentation

## 2015-11-18 DIAGNOSIS — Z7982 Long term (current) use of aspirin: Secondary | ICD-10-CM | POA: Diagnosis not present

## 2015-11-18 DIAGNOSIS — F988 Other specified behavioral and emotional disorders with onset usually occurring in childhood and adolescence: Secondary | ICD-10-CM | POA: Diagnosis not present

## 2015-11-18 DIAGNOSIS — Z800 Family history of malignant neoplasm of digestive organs: Secondary | ICD-10-CM | POA: Diagnosis not present

## 2015-11-18 DIAGNOSIS — Z802 Family history of malignant neoplasm of other respiratory and intrathoracic organs: Secondary | ICD-10-CM

## 2015-11-18 LAB — CBC WITH DIFFERENTIAL/PLATELET
BASOS ABS: 0.1 10*3/uL (ref 0–0.1)
BASOS PCT: 1 %
EOS ABS: 0.1 10*3/uL (ref 0–0.7)
Eosinophils Relative: 2 %
HEMATOCRIT: 39.3 % (ref 35.0–47.0)
HEMOGLOBIN: 13.6 g/dL (ref 12.0–16.0)
Lymphocytes Relative: 25 %
Lymphs Abs: 2.4 10*3/uL (ref 1.0–3.6)
MCH: 30.8 pg (ref 26.0–34.0)
MCHC: 34.7 g/dL (ref 32.0–36.0)
MCV: 88.9 fL (ref 80.0–100.0)
MONOS PCT: 7 %
Monocytes Absolute: 0.6 10*3/uL (ref 0.2–0.9)
NEUTROS ABS: 6.2 10*3/uL (ref 1.4–6.5)
NEUTROS PCT: 65 %
Platelets: 359 10*3/uL (ref 150–440)
RBC: 4.42 MIL/uL (ref 3.80–5.20)
RDW: 12.9 % (ref 11.5–14.5)
WBC: 9.5 10*3/uL (ref 3.6–11.0)

## 2015-11-18 LAB — IRON AND TIBC
Iron: 47 ug/dL (ref 28–170)
SATURATION RATIOS: 12 % (ref 10.4–31.8)
TIBC: 401 ug/dL (ref 250–450)
UIBC: 354 ug/dL

## 2015-11-18 LAB — FERRITIN: Ferritin: 27 ng/mL (ref 11–307)

## 2015-11-18 NOTE — Progress Notes (Signed)
Patient is here today for follow up, no complaints.  

## 2015-11-24 ENCOUNTER — Other Ambulatory Visit: Payer: Self-pay | Admitting: Family Medicine

## 2015-12-16 DIAGNOSIS — K080 Exfoliation of teeth due to systemic causes: Secondary | ICD-10-CM | POA: Diagnosis not present

## 2015-12-18 DIAGNOSIS — K080 Exfoliation of teeth due to systemic causes: Secondary | ICD-10-CM | POA: Diagnosis not present

## 2016-01-16 ENCOUNTER — Ambulatory Visit (INDEPENDENT_AMBULATORY_CARE_PROVIDER_SITE_OTHER): Payer: Federal, State, Local not specified - PPO

## 2016-01-16 DIAGNOSIS — Z23 Encounter for immunization: Secondary | ICD-10-CM

## 2016-02-22 ENCOUNTER — Other Ambulatory Visit: Payer: Self-pay | Admitting: Family Medicine

## 2016-05-02 DIAGNOSIS — H109 Unspecified conjunctivitis: Secondary | ICD-10-CM | POA: Diagnosis not present

## 2016-05-02 DIAGNOSIS — J329 Chronic sinusitis, unspecified: Secondary | ICD-10-CM | POA: Diagnosis not present

## 2016-05-18 DIAGNOSIS — N950 Postmenopausal bleeding: Secondary | ICD-10-CM | POA: Diagnosis not present

## 2016-05-18 DIAGNOSIS — Z6832 Body mass index (BMI) 32.0-32.9, adult: Secondary | ICD-10-CM | POA: Diagnosis not present

## 2016-05-18 DIAGNOSIS — Z01419 Encounter for gynecological examination (general) (routine) without abnormal findings: Secondary | ICD-10-CM | POA: Diagnosis not present

## 2016-05-18 DIAGNOSIS — R938 Abnormal findings on diagnostic imaging of other specified body structures: Secondary | ICD-10-CM | POA: Diagnosis not present

## 2016-05-23 ENCOUNTER — Other Ambulatory Visit: Payer: Self-pay | Admitting: Family Medicine

## 2016-06-15 DIAGNOSIS — K080 Exfoliation of teeth due to systemic causes: Secondary | ICD-10-CM | POA: Diagnosis not present

## 2016-06-27 ENCOUNTER — Other Ambulatory Visit: Payer: Self-pay | Admitting: Family Medicine

## 2016-06-30 DIAGNOSIS — N950 Postmenopausal bleeding: Secondary | ICD-10-CM | POA: Diagnosis not present

## 2016-07-02 ENCOUNTER — Other Ambulatory Visit: Payer: Self-pay | Admitting: Family Medicine

## 2016-07-08 DIAGNOSIS — N950 Postmenopausal bleeding: Secondary | ICD-10-CM | POA: Diagnosis not present

## 2016-07-08 DIAGNOSIS — N8500 Endometrial hyperplasia, unspecified: Secondary | ICD-10-CM | POA: Diagnosis not present

## 2016-07-26 ENCOUNTER — Encounter: Payer: Self-pay | Admitting: Family Medicine

## 2016-07-26 ENCOUNTER — Other Ambulatory Visit: Payer: Self-pay | Admitting: Family Medicine

## 2016-07-26 ENCOUNTER — Ambulatory Visit (INDEPENDENT_AMBULATORY_CARE_PROVIDER_SITE_OTHER): Payer: Federal, State, Local not specified - PPO | Admitting: Family Medicine

## 2016-07-26 VITALS — BP 140/98 | HR 108 | Temp 98.0°F | Wt 196.5 lb

## 2016-07-26 DIAGNOSIS — K529 Noninfective gastroenteritis and colitis, unspecified: Secondary | ICD-10-CM | POA: Diagnosis not present

## 2016-07-26 MED ORDER — ONDANSETRON 8 MG PO TBDP: 8.0000 mg | ORAL_TABLET | Freq: Three times a day (TID) | ORAL | 0 refills | Status: DC | PRN

## 2016-07-26 NOTE — Progress Notes (Signed)
Subjective:    Patient ID: Whitney [name], female    DOB: April 11, 1960, 56 y.o.   MRN: [id number]  HPI This is a 56 yo female who presents today with diarrhea, nausea and fatigue x 3 days. Started with diarrhea and headache. Stool is watery, no blood or mucus. No vomiting in last 24 hours. Last time she vomited was yesterday morning after eating a [place] sausage gravy biscuit. Drinking Gatorade, ginger ale.Urinating regularly, no dark urine. Some sensation of being hot, has not checked her temperature. No abdominal pain. No one else sick. Ate some salads out prior to symptoms starting. Has had several episodes of diarrhea this morning. Has taken 2-3 immodium over last 3 days.  Has not checked blood sugar in 2 days.   Past Medical History:  Diagnosis Date  . Allergy    Seasonal  . Anxiety   . Attention deficit disorder without mention of hyperactivity   . Barrett's esophagus   . Congenital anomalies of foot, not elsewhere classified   . Depression   . Diabetes mellitus without complication ([place])   . Displacement of intervertebral disc, site unspecified, without myelopathy   . Diverticulosis   . Elevated blood pressure reading without diagnosis of hypertension   . Esophageal reflux   . Gallstones   . Hiatal hernia   . HLD (hyperlipidemia)   . Hypoglycemia, unspecified   . Internal hemorrhoids without mention of complication   . Other abnormal blood chemistry    Past Surgical History:  Procedure Laterality Date  . BREAST CYST EXCISION Left   . CARDIAC CATHETERIZATION     [facility]  . CESAREAN SECTION    . CHOLECYSTECTOMY  06/16/2007   Dr. Hulen Skains  . COLONOSCOPY  02/06/2002   diverticula and internal hemorroids  . HIATAL HERNIA REPAIR    . LUMBAR LAMINECTOMY     L5-S1   Family History  Problem Relation Age of Onset  . Colon polyps Mother   . Hypertension Father   . Diabetes Father   . Heart disease Father   . Colon polyps Maternal Grandmother   . Heart attack Maternal  Grandmother   . Prostate cancer Maternal Grandfather   . Heart attack Paternal Grandfather   . Colon cancer Paternal Grandmother   . Diabetes Maternal Aunt    Social History  Substance Use Topics  . Smoking status: Former Smoker    Types: Cigarettes  . Smokeless tobacco: Never Used     Comment: 2 years as a teenager  . Alcohol use 0.0 oz/week     Comment: occasional wine      Review of Systems Per HPI    Objective:   Physical Exam  Constitutional: She is oriented to person, place, and time. She appears well-developed and well-nourished. No distress.  HENT:  Head: Normocephalic.  Eyes: Conjunctivae are normal.  Cardiovascular: Normal rate, regular rhythm and normal heart sounds.   Auscultated HR 90.   Pulmonary/Chest: Effort normal and breath sounds normal.  Abdominal: Soft. Bowel sounds are normal. She exhibits no distension. There is no tenderness. There is no rebound and no guarding.  Neurological: She is alert and oriented to person, place, and time.  Skin: Skin is warm and dry. She is not diaphoretic.  Psychiatric: She has a normal mood and affect. Her behavior is normal. Judgment and thought content normal.  Vitals reviewed.     BP (!) 140/98 (BP Location: Right Arm, Patient Position: Sitting, Cuff Size: Normal)   Pulse (!) 108  Temp 98 F (36.7 C) (Oral)   Wt 196 lb 8 oz (89.1 kg)   LMP 06/17/2011   SpO2 96%   BMI 30.78 kg/m  Wt Readings from Last 3 Encounters:  07/26/16 196 lb 8 oz (89.1 kg)  11/18/15 205 lb 4 oz (93.1 kg)  10/14/15 204 lb (92.5 kg)   BP Readings from Last 3 Encounters:  07/26/16 (!) 140/98  11/18/15 124/88  10/14/15 128/72       Assessment & Plan:  1. Gastroenteritis - Provided written and verbal information regarding diagnosis and treatment. - discussed fluid replacement and slow introduction of bland foods - RTC precautions reviewed - ondansetron (ZOFRAN-ODT) 8 MG disintegrating tablet; Take 1 tablet (8 mg total) by mouth  every 8 (eight) hours as needed for nausea.  Dispense: 10 tablet; Refill: 0   Clarene Reamer, FNP-BC  [facility]  07/26/2016 10:22 AM

## 2016-07-26 NOTE — Progress Notes (Signed)
Pre visit review using our clinic review tool, if applicable. No additional management support is needed unless otherwise documented below in the visit note. 

## 2016-07-26 NOTE — Patient Instructions (Signed)
Drink small amounts of liquids frequently Check your blood sugar twice a day If not better in 48 hours, please let us know  Food Choices to Help Relieve Diarrhea, Adult When you have diarrhea, the foods you eat and your eating habits are very important. Choosing the right foods and drinks can help:  Relieve diarrhea.  Replace lost fluids and nutrients.  Prevent dehydration. What general guidelines should I follow? Relieving diarrhea   Choose foods with less than 2 g or .07 oz. of fiber per serving.  Limit fats to less than 8 tsp (38 g or 1.34 oz.) a day.  Avoid the following:  Foods and beverages sweetened with high-fructose corn syrup, honey, or sugar alcohols such as xylitol, sorbitol, and mannitol.  Foods that contain a lot of fat or sugar.  Fried, greasy, or spicy foods.  High-fiber grains, breads, and cereals.  Raw fruits and vegetables.  Eat foods that are rich in probiotics. These foods include dairy products such as yogurt and fermented milk products. They help increase healthy bacteria in the stomach and intestines (gastrointestinal tract, or GI tract).  If you have lactose intolerance, avoid dairy products. These may make your diarrhea worse.  Take medicine to help stop diarrhea (antidiarrheal medicine) only as told by your health care provider. Replacing nutrients   Eat small meals or snacks every 3-4 hours.  Eat bland foods, such as white rice, toast, or baked potato, until your diarrhea starts to get better. Gradually reintroduce nutrient-rich foods as tolerated or as told by your health care provider. This includes:  Well-cooked protein foods.  Peeled, seeded, and soft-cooked fruits and vegetables.  Low-fat dairy products.  Take vitamin and mineral supplements as told by your health care provider. Preventing dehydration    Start by sipping water or a special solution to prevent dehydration (oral rehydration solution, ORS). Urine that is clear or pale  yellow means that you are getting enough fluid.  Try to drink at least 8-10 cups of fluid each day to help replace lost fluids.  You may add other liquids in addition to water, such as clear juice or decaffeinated sports drinks, as tolerated or as told by your health care provider.  Avoid drinks with caffeine, such as coffee, tea, or soft drinks.  Avoid alcohol. What foods are recommended? The items listed may not be a complete list. Talk with your health care provider about what dietary choices are best for you. Grains  White rice. White, [place], or pita breads (fresh or toasted), including plain rolls, buns, or bagels. White pasta. Saltine, soda, or graham crackers. Pretzels. Low-fiber cereal. Cooked cereals made with water (such as cornmeal, farina, or cream cereals). Plain muffins. Matzo. Melba toast. Zwieback. Vegetables  Potatoes (without the skin). Most well-cooked and canned vegetables without skins or seeds. Tender lettuce. Fruits  Apple sauce. Fruits canned in juice. Cooked apricots, cherries, grapefruit, peaches, pears, or plums. Fresh bananas and cantaloupe. Meats and other protein foods  Baked or boiled chicken. Eggs. Tofu. Fish. Seafood. Smooth nut butters. Ground or well-cooked tender beef, ham, veal, lamb, pork, or poultry. Dairy  Plain yogurt, kefir, and unsweetened liquid yogurt. Lactose-free milk, buttermilk, skim milk, or soy milk. Low-fat or nonfat hard cheese. Beverages  Water. Low-calorie sports drinks. Fruit juices without pulp. Strained tomato and vegetable juices. Decaffeinated teas. Sugar-free beverages not sweetened with sugar alcohols. Oral rehydration solutions, if approved by your health care provider. Seasoning and other foods  Bouillon, broth, or soups made from recommended foods.  What foods are not recommended? The items listed may not be a complete list. Talk with your health care provider about what dietary choices are best for you. Grains  Whole grain,  whole wheat, bran, or rye breads, rolls, pastas, and crackers. Wild or brown rice. Whole grain or bran cereals. Barley. Oats and oatmeal. Corn tortillas or taco shells. Granola. Popcorn. Vegetables  Raw vegetables. Fried vegetables. Cabbage, broccoli, Brussels sprouts, artichokes, baked beans, beet greens, corn, kale, legumes, peas, sweet potatoes, and yams. Potato skins. Cooked spinach and cabbage. Fruits  Dried fruit, including raisins and dates. Raw fruits. Stewed or dried prunes. Canned fruits with syrup. Meat and other protein foods  Fried or fatty meats. Deli meats. Chunky nut butters. Nuts and seeds. Beans and lentils. Berniece Salines. Hot dogs. Sausage. Dairy  High-fat cheeses. Whole milk, chocolate milk, and beverages made with milk, such as milk shakes. Half-and-half. Cream. sour cream. Ice cream. Beverages  Caffeinated beverages (such as coffee, tea, soda, or energy drinks). Alcoholic beverages. Fruit juices with pulp. Prune juice. Soft drinks sweetened with high-fructose corn syrup or sugar alcohols. High-calorie sports drinks. Fats and oils  Butter. Cream sauces. Margarine. Salad oils. Plain salad dressings. Olives. Avocados. Mayonnaise. Sweets and desserts  Sweet rolls, doughnuts, and sweet breads. Sugar-free desserts sweetened with sugar alcohols such as xylitol and sorbitol. Seasoning and other foods  Honey. Hot sauce. Chili powder. Gravy. Cream-based or milk-based soups. Pancakes and waffles. Summary  When you have diarrhea, the foods you eat and your eating habits are very important.  Make sure you get at least 8-10 cups of fluid each day, or enough to keep your urine clear or pale yellow.  Eat bland foods and gradually reintroduce healthy, nutrient-rich foods as tolerated, or as told by your health care provider.  Avoid high-fiber, fried, greasy, or spicy foods. This information is not intended to replace advice given to you by your health care provider. Make sure you discuss any  questions you have with your health care provider. Document Released: 06/05/2003 Document Revised: 03/12/2016 Document Reviewed: 03/12/2016 Elsevier Interactive Patient Education  2017 [employer].

## 2016-07-27 ENCOUNTER — Ambulatory Visit (INDEPENDENT_AMBULATORY_CARE_PROVIDER_SITE_OTHER): Payer: Federal, State, Local not specified - PPO | Admitting: Family Medicine

## 2016-07-27 ENCOUNTER — Encounter: Payer: Self-pay | Admitting: Family Medicine

## 2016-07-27 VITALS — BP 110/70 | HR 104 | Temp 97.7°F | Wt 196.0 lb

## 2016-07-27 DIAGNOSIS — E785 Hyperlipidemia, unspecified: Secondary | ICD-10-CM

## 2016-07-27 DIAGNOSIS — K529 Noninfective gastroenteritis and colitis, unspecified: Secondary | ICD-10-CM

## 2016-07-27 DIAGNOSIS — E119 Type 2 diabetes mellitus without complications: Secondary | ICD-10-CM | POA: Diagnosis not present

## 2016-07-27 HISTORY — DX: Noninfective gastroenteritis and colitis, unspecified: K52.9

## 2016-07-27 LAB — COMPREHENSIVE METABOLIC PANEL
ALBUMIN: 4.1 g/dL (ref 3.5–5.2)
ALK PHOS: 90 U/L (ref 39–117)
ALT: 42 U/L — AB (ref 0–35)
AST: 40 U/L — AB (ref 0–37)
BILIRUBIN TOTAL: 0.4 mg/dL (ref 0.2–1.2)
BUN: 16 mg/dL (ref 6–23)
CALCIUM: 9.6 mg/dL (ref 8.4–10.5)
CO2: 25 mEq/L (ref 19–32)
CREATININE: 0.94 mg/dL (ref 0.40–1.20)
Chloride: 101 mEq/L (ref 96–112)
GFR: 65.14 mL/min (ref >60.00)
Glucose, Bld: 172 mg/dL — ABNORMAL HIGH (ref 70–99)
Potassium: 3.5 mEq/L (ref 3.5–5.1)
Sodium: 135 mEq/L (ref 135–145)
TOTAL PROTEIN: 7.2 g/dL (ref 6.0–8.3)

## 2016-07-27 LAB — LIPID PANEL
Cholesterol: 186 mg/dL (ref 0–200)
HDL: 43.4 mg/dL (ref >39.00)
NonHDL: 142.39
TRIGLYCERIDES: 277 mg/dL — AB (ref 0.0–149.0)
Total CHOL/HDL Ratio: 4
VLDL: 55.4 mg/dL — ABNORMAL HIGH (ref 0.0–40.0)

## 2016-07-27 LAB — MICROALBUMIN / CREATININE URINE RATIO
Creatinine,U: 587.6 mg/dL
Microalb Creat Ratio: 1.5 mg/g (ref 0.0–30.0)
Microalb, Ur: 8.7 mg/dL — ABNORMAL HIGH (ref 0.0–1.9)

## 2016-07-27 LAB — HEMOGLOBIN A1C: Hgb A1c MFr Bld: 9 % — ABNORMAL HIGH (ref 4.6–6.5)

## 2016-07-27 LAB — LDL CHOLESTEROL, DIRECT: Direct LDL: 103 mg/dL

## 2016-07-27 NOTE — Progress Notes (Signed)
Subjective:   Patient ID: Whitney [name], female    DOB: 1960/10/14, 56 y.o.   MRN: [id number]  Whitney [name] is a pleasant 56 y.o. year old female who presents to clinic today with Hyperlipidemia (Due for labwork) and Stomach virus (Has been sick the last few days)  on 07/27/2016  HPI:  Gastroenteritis- saw Tor Netters, NP, yesterday for 3 days of watery diarrhea, nausea and fatigue. Note reviewed- advised supportive care, suspected viral gastroenteritis. eRx sent for zofran to use prn nausea. Symptoms have resolved.  She is slowly regaining her appetite.  DM-  Taking Metformin 500 mg twice daily.  Referred her for diabetic teaching. Strong FH of HLD and diabetes.  Checks FSBS once daily- morning ranging less than 135- 160s.  Has not had episodes of hypoglycemia. Lab Results  Component Value Date   HGBA1C 9.0 (H) 07/27/2016   HLD- taking Zocor 20 mg daily. Overdue for labs. Lab Results  Component Value Date   CHOL 186 07/27/2016   HDL 43.40 07/27/2016   LDLCALC 178 (H) 01/31/2014   LDLDIRECT 103.0 07/27/2016   TRIG 277.0 (H) 07/27/2016   CHOLHDL 4 07/27/2016     Current Outpatient Prescriptions on File Prior to Visit  Medication Sig Dispense Refill  . Aspirin-Salicylamide-Caffeine (BC HEADACHE POWDER PO) Take 1 packet by mouth daily as needed (pain).    . Blood Glucose Monitoring Suppl (FREESTYLE LITE) DEVI USE AS DIRECTED TO TEST BLOOD SUGAR  0  . cyclobenzaprine (FLEXERIL) 5 MG tablet TAKE 1 TABLET (5 MG TOTAL) BY MOUTH 3 (THREE) TIMES DAILY AS NEEDED FOR MUSCLE SPASMS. 30 tablet 1  . Esomeprazole Magnesium (NEXIUM 24HR PO) Take 2 capsules by mouth daily. Reported on 04/16/2015    . FREESTYLE LITE test strip USE AS INSTRUCTED 100 each 3  . glucose monitoring kit (FREESTYLE) monitoring kit 1 each by Does not apply route as needed for other. Use as directe 1 each 0  . Lancets (FREESTYLE) lancets 1 each by Other route as needed for other. Use as instructed    . metFORMIN  (GLUCOPHAGE) 500 MG tablet TAKE 1 TABLET (500 MG TOTAL) BY MOUTH 2 (TWO) TIMES DAILY WITH A MEAL. 180 tablet 3  . NAPROXEN SODIUM PO Take 1 tablet by mouth 2 (two) times daily as needed.     . ondansetron (ZOFRAN-ODT) 8 MG disintegrating tablet Take 1 tablet (8 mg total) by mouth every 8 (eight) hours as needed for nausea. 10 tablet 0  . simvastatin (ZOCOR) 20 MG tablet TAKE 1 TABLET BY MOUTH EVERY EVENING. NEED OFFICE VISIT FOR MORE REFILLS 30 tablet 0  . triamcinolone cream (KENALOG) 0.1 % Apply 1 application topically 2 (two) times daily. 30 g 1  . venlafaxine (EFFEXOR) 37.5 MG tablet TAKE 1 TABLET BY MOUTH EVERY EVENING 90 tablet 0   No current facility-administered medications on file prior to visit.     Allergies  Allergen Reactions  . Sertraline Hcl     REACTION: Headache/ vision changes  . Zoloft [Sertraline Hcl]     Same as Sertraline    Past Medical History:  Diagnosis Date  . Allergy    Seasonal  . Anxiety   . Attention deficit disorder without mention of hyperactivity   . Barrett's esophagus   . Congenital anomalies of foot, not elsewhere classified   . Depression   . Diabetes mellitus without complication ([place])   . Displacement of intervertebral disc, site unspecified, without myelopathy   . Diverticulosis   . Elevated  blood pressure reading without diagnosis of hypertension   . Esophageal reflux   . Gallstones   . Hiatal hernia   . HLD (hyperlipidemia)   . Hypoglycemia, unspecified   . Internal hemorrhoids without mention of complication   . Other abnormal blood chemistry     Past Surgical History:  Procedure Laterality Date  . BREAST CYST EXCISION Left   . CARDIAC CATHETERIZATION     [facility]  . CESAREAN SECTION    . CHOLECYSTECTOMY  06/16/2007   Dr. Hulen Skains  . COLONOSCOPY  02/06/2002   diverticula and internal hemorroids  . HIATAL HERNIA REPAIR    . LUMBAR LAMINECTOMY     L5-S1    Family History  Problem Relation Age of Onset  . Colon polyps Mother     . Hypertension Father   . Diabetes Father   . Heart disease Father   . Colon polyps Maternal Grandmother   . Heart attack Maternal Grandmother   . Prostate cancer Maternal Grandfather   . Heart attack Paternal Grandfather   . Colon cancer Paternal Grandmother   . Diabetes Maternal Aunt     Social History   Social History  . Marital status: Married    Spouse name: N/A  . Number of children: 2  . Years of education: N/A   Occupational History  . 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 Smoking status: Former Smoker    Types: Cigarettes  . Smokeless tobacco: Never Used     Comment: 2 years as a teenager  . Alcohol use 0.0 oz/week     Comment: occasional wine  . Drug use: No  . Sexual activity: Yes   Other Topics Concern  . Not on file   Social History Narrative  . No narrative on file   The PMH, PSH, Social History, Family History, Medications, and allergies have been reviewed in [facility], and have been updated if relevant.   Review of Systems  Constitutional: Negative.   HENT: Negative.   Gastrointestinal: Negative.   Endocrine: Negative.   Genitourinary: Negative.   Musculoskeletal: Negative.   Allergic/Immunologic: Negative.   Neurological: Negative.   Hematological: Negative.   Psychiatric/Behavioral: Negative.   All other systems reviewed and are negative.      Objective:    BP 110/70 (BP Location: Left Arm, Patient Position: Sitting, Cuff Size: Large)   Pulse (!) 104   Temp 97.7 F (36.5 C) (Oral)   Wt 196 lb (88.9 kg)   LMP 06/17/2011   SpO2 95%   BMI 30.70 kg/m    Physical Exam   General:  Well-developed,well-nourished,in no acute distress; alert,appropriate and cooperative throughout examination Head:  normocephalic and atraumatic.   Eyes:  vision grossly intact, PERRL Ears:  R ear normal and L ear normal externally, TMs clear bilaterally Nose:  no external deformity.   Mouth:  good dentition.   Neck:  No  deformities, masses, or tenderness noted. Lungs:  Normal respiratory effort, chest expands symmetrically. Lungs are clear to auscultation, no crackles or wheezes. Heart:  Normal rate and regular rhythm. S1 and S2 normal without gallop, murmur, click, rub or other extra sounds. Abdomen:  Bowel sounds positive,abdomen soft and non-tender without masses, organomegaly or hernias noted. Msk:  No deformity or scoliosis noted of thoracic or lumbar spine.   Extremities:  No clubbing, cyanosis, edema, or deformity noted with normal full range of motion of all joints.   Neurologic:  alert & oriented X3 and  gait normal.   Skin:  Intact without suspicious lesions or rashes Psych:  Cognition and judgment appear intact. Alert and cooperative with normal attention span and concentration. No apparent delusions, illusions, hallucinations       Assessment & Plan:   Hyperlipidemia, unspecified hyperlipidemia type - Plan: Comprehensive metabolic panel, Lipid panel  New onset type 2 diabetes mellitus ([place]) - Plan: Hemoglobin A1c, Microalbumin / creatinine urine ratio  Gastroenteritis No Follow-up on file.

## 2016-07-27 NOTE — Patient Instructions (Signed)
Great to see you.  I'm so glad you're feeling better.  I will call you with your lab results and you can view them online.

## 2016-07-27 NOTE — Progress Notes (Signed)
Pre visit review using our clinic review tool, if applicable. No additional management support is needed unless otherwise documented below in the visit note. 

## 2016-07-28 NOTE — Assessment & Plan Note (Signed)
Continue statin. Check labs today. 

## 2016-07-28 NOTE — Assessment & Plan Note (Signed)
Continue current rxs. Check labs today. The patient indicates understanding of these issues and agrees with the plan.

## 2016-07-28 NOTE — Assessment & Plan Note (Signed)
Symptoms resolved. No further work up or tx needed at this time.

## 2016-08-04 ENCOUNTER — Encounter: Payer: Self-pay | Admitting: Family Medicine

## 2016-08-04 MED ORDER — SIMVASTATIN 20 MG PO TABS: ORAL_TABLET | ORAL | 3 refills | Status: DC

## 2016-09-08 ENCOUNTER — Telehealth: Payer: Self-pay | Admitting: Family Medicine

## 2016-09-08 NOTE — Telephone Encounter (Signed)
Pt dropped off [employer] form for your review/signature. She requests it to be faxed back to [facility] [phone] when completed. I placed in Rx tower.

## 2016-09-11 ENCOUNTER — Other Ambulatory Visit: Payer: Self-pay | Admitting: Family Medicine

## 2016-10-21 ENCOUNTER — Other Ambulatory Visit: Payer: Self-pay | Admitting: Family Medicine

## 2016-10-21 NOTE — Telephone Encounter (Signed)
Last refill 07/26/16 Last OV 07/27/16 Ok to refill?

## 2016-12-10 ENCOUNTER — Other Ambulatory Visit: Payer: Self-pay | Admitting: Family Medicine

## 2016-12-21 DIAGNOSIS — K080 Exfoliation of teeth due to systemic causes: Secondary | ICD-10-CM | POA: Diagnosis not present

## 2017-01-05 ENCOUNTER — Encounter: Payer: Self-pay | Admitting: Family Medicine

## 2017-01-17 ENCOUNTER — Other Ambulatory Visit: Payer: Self-pay | Admitting: Family Medicine

## 2017-02-08 ENCOUNTER — Ambulatory Visit: Payer: Federal, State, Local not specified - PPO

## 2017-02-25 ENCOUNTER — Other Ambulatory Visit: Payer: Self-pay | Admitting: Family Medicine

## 2017-02-28 ENCOUNTER — Other Ambulatory Visit: Payer: Self-pay

## 2017-02-28 MED ORDER — GLUCOSE BLOOD VI STRP: ORAL_STRIP | 3 refills | Status: DC

## 2017-02-28 NOTE — Progress Notes (Signed)
Rx for test strips faxed with note that pt needs appt/thx dmf

## 2017-03-24 ENCOUNTER — Other Ambulatory Visit: Payer: Self-pay

## 2017-03-24 MED ORDER — METFORMIN HCL 500 MG PO TABS: 500.0000 mg | ORAL_TABLET | Freq: Two times a day (BID) | ORAL | 0 refills | Status: DC

## 2017-04-26 ENCOUNTER — Other Ambulatory Visit: Payer: Self-pay | Admitting: Family Medicine

## 2017-06-10 LAB — HM DIABETES EYE EXAM

## 2017-06-20 ENCOUNTER — Ambulatory Visit: Payer: Self-pay

## 2017-06-20 ENCOUNTER — Telehealth: Payer: Self-pay | Admitting: Family Medicine

## 2017-06-20 NOTE — Telephone Encounter (Signed)
Patient called and made aware of new patient appointment with Dr. Deborra Medina on Monday, July 15 at 1400. Advised the office will call with a lab appointment prior to this appointment, she verbalized understanding.

## 2017-06-20 NOTE — Telephone Encounter (Signed)
Patient called with c.o "high blood sugar." She says "my blood sugar has been reading higher than normal, in the 200's and once it got up to 325 two days ago. I take the metformin twice a day as prescribed, but I don't know if it's working for me. I have off an on blurred vision, feeling tired, exhausted and sometimes light headed and feelings of numbness to my feet all the time." According to protocol, see PCP within 24 hours, PCP no longer at [facility], so patient agrees to see another provider for this issue, then follow up with her PCP at a later date. Appointment made for tomorrow at 1430 with Alma Friendly, NP. Care advice given, patient verbalizes understanding.  Reason for Disposition . [1] Symptoms of high blood sugar (e.g., frequent urination, weak, weight loss) AND [2] not able to test blood glucose  Answer Assessment - Initial Assessment Questions 1. BLOOD GLUCOSE: "What is your blood glucose level?"      209  2. ONSET: "When did you check the blood glucose?"     This morning 3. USUAL RANGE: "What is your glucose level usually?" (e.g., usual fasting morning value, usual evening value)     170's-180's 4. KETONES: "Do you check for ketones (urine or blood test strips)?" If yes, ask: "What does the test show now?"      No 5. TYPE 1 or 2:  "Do you know what type of diabetes you have?"  (e.g., Type 1, Type 2, Gestational; doesn't know)      Type 2 6. INSULIN: "Do you take insulin?" If yes, ask: "Have you missed any shots recently?"     No 7. DIABETES PILLS: "Do you take any pills for your diabetes?" If yes, ask: "Have you missed taking any pills recently?"     Metformin; taking every day as prescribed 8. OTHER SYMPTOMS: "Do you have any symptoms?" (e.g., fever, frequent urination, difficulty breathing, dizziness, weakness, vomiting)     Weakness, exhausted, tired all the time, blurred vision sometimes, feet feels numbness all the time. 9. PREGNANCY: "Is there any chance you are  pregnant?" "When was your last menstrual period?"     No  Protocols used: DIABETES - HIGH BLOOD SUGAR-A-AH

## 2017-06-21 ENCOUNTER — Other Ambulatory Visit: Payer: Self-pay | Admitting: Family Medicine

## 2017-06-21 ENCOUNTER — Ambulatory Visit: Payer: Federal, State, Local not specified - PPO | Admitting: Primary Care

## 2017-06-21 ENCOUNTER — Encounter: Payer: Self-pay | Admitting: Primary Care

## 2017-06-21 VITALS — BP 120/76 | HR 90 | Temp 97.9°F | Ht 67.0 in | Wt 202.5 lb

## 2017-06-21 DIAGNOSIS — E119 Type 2 diabetes mellitus without complications: Secondary | ICD-10-CM

## 2017-06-21 DIAGNOSIS — E785 Hyperlipidemia, unspecified: Secondary | ICD-10-CM | POA: Diagnosis not present

## 2017-06-21 LAB — COMPREHENSIVE METABOLIC PANEL
ALK PHOS: 96 U/L (ref 39–117)
ALT: 79 U/L — ABNORMAL HIGH (ref 0–35)
AST: 79 U/L — ABNORMAL HIGH (ref 0–37)
Albumin: 3.8 g/dL (ref 3.5–5.2)
BUN: 17 mg/dL (ref 6–23)
CHLORIDE: 102 meq/L (ref 96–112)
CO2: 26 meq/L (ref 19–32)
Calcium: 9.3 mg/dL (ref 8.4–10.5)
Creatinine, Ser: 0.81 mg/dL (ref 0.40–1.20)
GFR: 77.11 mL/min (ref >60.00)
GLUCOSE: 218 mg/dL — AB (ref 70–99)
POTASSIUM: 4.2 meq/L (ref 3.5–5.1)
SODIUM: 137 meq/L (ref 135–145)
TOTAL PROTEIN: 6.9 g/dL (ref 6.0–8.3)
Total Bilirubin: 0.2 mg/dL (ref 0.2–1.2)

## 2017-06-21 LAB — LIPID PANEL
CHOL/HDL RATIO: 4
Cholesterol: 179 mg/dL (ref 0–200)
HDL: 47.5 mg/dL (ref >39.00)
NONHDL: 131.89
Triglycerides: 235 mg/dL — ABNORMAL HIGH (ref 0.0–149.0)
VLDL: 47 mg/dL — ABNORMAL HIGH (ref 0.0–40.0)

## 2017-06-21 LAB — MICROALBUMIN / CREATININE URINE RATIO
CREATININE, U: 84 mg/dL
Microalb Creat Ratio: 0.8 mg/g (ref 0.0–30.0)
Microalb, Ur: <0.7 mg/dL (ref 0.0–1.9)

## 2017-06-21 LAB — HEMOGLOBIN A1C: Hgb A1c MFr Bld: 9.9 % — ABNORMAL HIGH (ref 4.6–6.5)

## 2017-06-21 NOTE — Patient Instructions (Signed)
Stop by the lab prior to leaving today. I will notify you of your results once received.   Continue to work on improvements in your diet as discussed.  Start exercising. You should be getting 150 minutes of moderate intensity exercise weekly.  Continue Metformin 500 mg tablets twice daily for now, I'll be in touch soon through My Chart.  It was a pleasure meeting you!

## 2017-06-21 NOTE — Assessment & Plan Note (Signed)
A1C of 9.0 in May 2018, no follow up since. Suspect the majority of her symptoms today are from uncontrolled diabetes.  Repeat A1C and urine microalbumin pending. Consider increasing Metformin and adding another oral agent vs adding Lantus. This will depend on A1C. She does seem motivated to continue on diet changes, recommended regular exercise.  Will await lab results.

## 2017-06-21 NOTE — Progress Notes (Signed)
Subjective:    Patient ID: Whitney [name], female    DOB: 1960/12/12, 57 y.o.   MRN: [id number]  HPI  Ms. Whitney [name] is a 57 year old female with a history of type 2 diabetes who presents today with a chief complaint of hyperglycemia. She is a patient of Dr. Deborra Medina and is still deciding for whom she'd like to transfer to.   Her last A1C was 9.0 in May 2018. Urine microalbumin was negative at that time. She is managed on metformin 500 mg BID and endorses compliance.  Over the past 2 month she's noticed feeling fatigued, difficulty focusing, "brain fog", blurred vision, numbness to her feet. She went to the optometrist last week and was told she had a normal exam.  She checks her blood sugars every morning before breakfast which run low 200's to low 300's. Her highest number was 325 last week and her lowest number was 195 last week. Her blood glucose levels have been in this range for nearly one year.   She recently started weight watchers two weeks ago, prior to that she endorsed a poor diet. She has been under a lot of stress caring for her mother in law.  Diet currently consists of:  Breakfast: Eggs, toast Lunch: Restaurants, (chicken, fish, vegetables, salads) Dinner: Fast food, restaurants (meat, vegetables), bread Snacks: Protein drink Desserts: None Beverages: Some water, milk, soda (stopped 2 weeks ago), sweet tea (sometimes uses Stevia).   Exercise: She does not exercise.    She's also wanting to come off of her Simvastatin as she experiences lower extremity stiffness. Her mother takes Red Yeast Rice and CoQ10 and has done very well.   Review of Systems  Constitutional: Positive for fatigue.  Eyes: Positive for visual disturbance.  Respiratory: Negative for shortness of breath.   Cardiovascular: Negative for chest pain.  Musculoskeletal:       Bilateral lower extremity stiffness.  Neurological: Negative for dizziness.       Numbness to plantar feet  Psychiatric/Behavioral:  Positive for decreased concentration.       Past Medical History:  Diagnosis Date  . Allergy    Seasonal  . Anxiety   . Attention deficit disorder without mention of hyperactivity   . Barrett's esophagus   . Congenital anomalies of foot, not elsewhere classified   . Depression   . Diabetes mellitus without complication ([place])   . Displacement of intervertebral disc, site unspecified, without myelopathy   . Diverticulosis   . Elevated blood pressure reading without diagnosis of hypertension   . Esophageal reflux   . Gallstones   . Hiatal hernia   . HLD (hyperlipidemia)   . Hypoglycemia, unspecified   . Internal hemorrhoids without mention of complication   . Other abnormal blood chemistry      Social History   Socioeconomic History  . Marital status: Married    Spouse name: Not on file  . Number of children: 2  . Years of education: Not on file  . Highest education level: Not on file  Occupational History  . Occupation: Self employeed- Conservation officer, historic buildings  Social Needs  . Financial resource strain: Not on file  . Food insecurity:    Worry: Not on file    Inability: Not on file  . Transportation needs:    Medical: Not on file    Non-medical: Not on file  Tobacco Use  . Smoking status: Former Smoker    Types: Cigarettes  . Smokeless tobacco: Never Used  . Tobacco  comment: 2 years as a teenager  Substance and Sexual Activity  . Alcohol use: Yes    Alcohol/week: 0.0 oz    Comment: occasional wine  . Drug use: No  . Sexual activity: Yes  Lifestyle  . Physical activity:    Days per week: Not on file    Minutes per session: Not on file  . Stress: Not on file  Relationships  . Social connections:    Talks on phone: Not on file    Gets together: Not on file    Attends religious service: Not on file    Active member of club or organization: Not on file    Attends meetings of clubs or organizations: Not on file    Relationship status: Not on file  . Intimate partner  violence:    Fear of current or ex partner: Not on file    Emotionally abused: Not on file    Physically abused: Not on file    Forced sexual activity: Not on file  Other Topics Concern  . Not on file  Social History Narrative  . Not on file    Past Surgical History:  Procedure Laterality Date  . BREAST CYST EXCISION Left   . CARDIAC CATHETERIZATION     [facility]  . CESAREAN SECTION    . CHOLECYSTECTOMY  06/16/2007   Dr. Hulen Skains  . COLONOSCOPY  02/06/2002   diverticula and internal hemorroids  . HIATAL HERNIA REPAIR    . LUMBAR LAMINECTOMY     L5-S1    Family History  Problem Relation Age of Onset  . Colon polyps Mother   . Hypertension Father   . Diabetes Father   . Heart disease Father   . Colon polyps Maternal Grandmother   . Heart attack Maternal Grandmother   . Prostate cancer Maternal Grandfather   . Heart attack Paternal Grandfather   . Colon cancer Paternal Grandmother   . Diabetes Maternal Aunt     Allergies  Allergen Reactions  . Sertraline Hcl     REACTION: Headache/ vision changes  . Zoloft [Sertraline Hcl]     Same as Sertraline    Current Outpatient Medications on File Prior to Visit  Medication Sig Dispense Refill  . Blood Glucose Monitoring Suppl (FREESTYLE LITE) DEVI USE AS DIRECTED TO TEST BLOOD SUGAR  0  . Cyanocobalamin (B-12) 5000 MCG CAPS Take by mouth.    . Esomeprazole Magnesium (NEXIUM 24HR PO) Take 2 capsules by mouth daily. Reported on 7/67/2094    . folic acid (FOLVITE) 709 MCG tablet Take 400 mcg by mouth daily.    Marland Kitchen glucose blood (FREESTYLE LITE) test strip USE AS INSTRUCTED 100 each 3  . glucose monitoring kit (FREESTYLE) monitoring kit 1 each by Does not apply route as needed for other. Use as directe 1 each 0  . Lancets (FREESTYLE) lancets 1 each by Other route as needed for other. Use as instructed    . metFORMIN (GLUCOPHAGE) 500 MG tablet Take 1 tablet (500 mg total) by mouth 2 (two) times daily with a meal. 180 tablet 0  .  Multiple Minerals-Vitamins (CALCIUM-MAGNESIUM-ZINC-D3) TABS Take by mouth.    . Omega-3 Fatty Acids (FISH OIL) 1200 MG CPDR Take by mouth.    . triamcinolone cream (KENALOG) 0.1 % APPLY 1 APPLICATION TOPICALLY 2 (TWO) TIMES DAILY. 30 g 1  . UNABLE TO FIND Med Name: CBD Oil    . cyclobenzaprine (FLEXERIL) 5 MG tablet TAKE 1 TABLET (5 MG TOTAL) BY MOUTH  3 (THREE) TIMES DAILY AS NEEDED FOR MUSCLE SPASMS. (Patient not taking: Reported on 06/21/2017) 30 tablet 1  . ondansetron (ZOFRAN-ODT) 8 MG disintegrating tablet Take 1 tablet (8 mg total) by mouth every 8 (eight) hours as needed for nausea. (Patient not taking: Reported on 06/21/2017) 10 tablet 0  . simvastatin (ZOCOR) 20 MG tablet TAKE 1 TABLET BY MOUTH EVERY EVENING. (Patient not taking: Reported on 06/21/2017) 90 tablet 3   No current facility-administered medications on file prior to visit.     BP 120/76   Pulse 90   Temp 97.9 F (36.6 C) (Oral)   Ht _0  (1.702 m)   Wt 202 lb 8 oz (91.9 kg)   LMP 06/17/2011   SpO2 96%   BMI 31.72 kg/m    Objective:   Physical Exam  Constitutional: She appears well-nourished.  Neck: Neck supple.  Cardiovascular: Normal rate and regular rhythm.  Pulmonary/Chest: Effort normal and breath sounds normal.  Skin: Skin is warm and dry.  Psychiatric: She has a normal mood and affect.          Assessment & Plan:

## 2017-06-21 NOTE — Assessment & Plan Note (Signed)
Discussed to remain on statin treatment given history of uncontrolled diabetes and risk for heart disease. Will repeat lipids today. LDL goal of <100. Consider switching to Crestor given myalgias with Simvastatin. She's agreeable.  Await labs.

## 2017-06-22 ENCOUNTER — Other Ambulatory Visit: Payer: Self-pay | Admitting: Primary Care

## 2017-06-22 DIAGNOSIS — E119 Type 2 diabetes mellitus without complications: Secondary | ICD-10-CM

## 2017-06-22 DIAGNOSIS — E7849 Other hyperlipidemia: Secondary | ICD-10-CM

## 2017-06-22 LAB — LDL CHOLESTEROL, DIRECT: Direct LDL: 105 mg/dL

## 2017-06-22 MED ORDER — GLIPIZIDE 10 MG PO TABS: ORAL_TABLET | ORAL | 1 refills | Status: DC

## 2017-06-22 MED ORDER — ROSUVASTATIN CALCIUM 5 MG PO TABS: ORAL_TABLET | ORAL | 3 refills | Status: DC

## 2017-06-22 MED ORDER — METFORMIN HCL 1000 MG PO TABS: ORAL_TABLET | ORAL | 3 refills | Status: DC

## 2017-06-22 NOTE — Telephone Encounter (Signed)
I recently increased her Metformin dose to 1000 mg BID, so disregard the 500 mg refill.  I think she plans to remain at [facility], but she'll notify us once she's decided. Thanks!

## 2017-06-22 NOTE — Telephone Encounter (Signed)
KC-I know that TA is the PCP/I see that you saw her for DM and her A1C has risen/I rec this refill req and you made mention in your visit that the Metformin may need to be increased/didn't know if you wanted to make that decision or what I should do/plz advise/thx dmf

## 2017-06-23 ENCOUNTER — Encounter: Payer: Self-pay | Admitting: Primary Care

## 2017-07-25 DIAGNOSIS — K080 Exfoliation of teeth due to systemic causes: Secondary | ICD-10-CM | POA: Diagnosis not present

## 2017-07-30 ENCOUNTER — Other Ambulatory Visit: Payer: Self-pay | Admitting: Family Medicine

## 2017-08-03 ENCOUNTER — Ambulatory Visit: Payer: Federal, State, Local not specified - PPO | Admitting: Primary Care

## 2017-08-03 ENCOUNTER — Encounter: Payer: Self-pay | Admitting: Primary Care

## 2017-08-03 VITALS — BP 116/70 | HR 76 | Temp 97.8°F | Ht 67.0 in | Wt 197.8 lb

## 2017-08-03 DIAGNOSIS — E1165 Type 2 diabetes mellitus with hyperglycemia: Secondary | ICD-10-CM

## 2017-08-03 DIAGNOSIS — E782 Mixed hyperlipidemia: Secondary | ICD-10-CM

## 2017-08-03 DIAGNOSIS — Z23 Encounter for immunization: Secondary | ICD-10-CM | POA: Diagnosis not present

## 2017-08-03 LAB — LIPID PANEL
CHOLESTEROL: 143 mg/dL (ref 0–200)
HDL: 47.5 mg/dL (ref >39.00)
LDL Cholesterol: 66 mg/dL (ref 0–99)
NONHDL: 95.45
Total CHOL/HDL Ratio: 3
Triglycerides: 148 mg/dL (ref 0.0–149.0)
VLDL: 29.6 mg/dL (ref 0.0–40.0)

## 2017-08-03 NOTE — Assessment & Plan Note (Signed)
Repeat lipids pending.  Doing better on rosuvastatin, continue same.

## 2017-08-03 NOTE — Progress Notes (Signed)
Subjective:    Patient ID: Whitney [name], female    DOB: 10-19-1960, 57 y.o.   MRN: [id number]  HPI  Whitney [name] is a 57 year old female who presents today for follow up.  1) Type 2 Diabetes:  Current medications include: Metformin 1000 mg BID, Glipizide 10 mg BID. She is compliant to both medications.  She is checking her blood glucose once to twice daily and is getting readings of: 30 minutes after breakfast: 150's, 170's AM fasting: 200 Bedtime: 107, 105  Last A1C: 9.9 06/21/17 Last Eye Exam: Completed in March 2019 Last Foot Exam:  Due Pneumonia Vaccination: Due today. ACE/ARB: Urine microalbumin negative 06/21/17 Statin: Rosuvastatin   Diet currently consists of:  Breakfast: Eggs (fried), boiled eggs, toast Lunch: Sandwich Dinner: Restaurants (salads, chicken) vegetables, fish Snacks: Protein drink, fruit Desserts: Sugar free pudding Beverages: Water, unsweet tea  Exercise: She is not currently exercising. Plans on starting walking.   2) Hyperlipidemia: Currently managed on rosuvastatin 5 mg which was added after her last visit due to LDL of 105 and complaints of myalgias with Simvastatin. Since her switch to rosuvastatin her symptoms have improved.     Review of Systems  Respiratory: Negative for shortness of breath.   Cardiovascular: Negative for chest pain.  Musculoskeletal: Negative for myalgias.  Neurological: Negative for dizziness, numbness and headaches.       Past Medical History:  Diagnosis Date  . Allergy    Seasonal  . Anxiety   . Attention deficit disorder without mention of hyperactivity   . Barrett's esophagus   . Congenital anomalies of foot, not elsewhere classified   . Depression   . Diabetes mellitus without complication ([place])   . Displacement of intervertebral disc, site unspecified, without myelopathy   . Diverticulosis   . Elevated blood pressure reading without diagnosis of hypertension   . Esophageal reflux   . Gallstones   .  Hiatal hernia   . HLD (hyperlipidemia)   . Hypoglycemia, unspecified   . Internal hemorrhoids without mention of complication   . Other abnormal blood chemistry      Social History   Socioeconomic History  . Marital status: Married    Spouse name: Not on file  . Number of children: 2  . Years of education: Not on file  . Highest education level: Not on file  Occupational History  . Occupation: Self employeed- Conservation officer, historic buildings  Social Needs  . Financial resource strain: Not on file  . Food insecurity:    Worry: Not on file    Inability: Not on file  . Transportation needs:    Medical: Not on file    Non-medical: Not on file  Tobacco Use  . Smoking status: Former Smoker    Types: Cigarettes  . Smokeless tobacco: Never Used  . Tobacco comment: 2 years as a teenager  Substance and Sexual Activity  . Alcohol use: Yes    Alcohol/week: 0.0 oz    Comment: occasional wine  . Drug use: No  . Sexual activity: Yes  Lifestyle  . Physical activity:    Days per week: Not on file    Minutes per session: Not on file  . Stress: Not on file  Relationships  . Social connections:    Talks on phone: Not on file    Gets together: Not on file    Attends religious service: Not on file    Active member of club or organization: Not on file    Attends  meetings of clubs or organizations: Not on file    Relationship status: Not on file  . Intimate partner violence:    Fear of current or ex partner: Not on file    Emotionally abused: Not on file    Physically abused: Not on file    Forced sexual activity: Not on file  Other Topics Concern  . Not on file  Social History Narrative  . Not on file    Past Surgical History:  Procedure Laterality Date  . BREAST CYST EXCISION Left   . CARDIAC CATHETERIZATION     [facility]  . CESAREAN SECTION    . CHOLECYSTECTOMY  06/16/2007   Dr. Hulen Skains  . COLONOSCOPY  02/06/2002   diverticula and internal hemorroids  . HIATAL HERNIA REPAIR    . LUMBAR  LAMINECTOMY     L5-S1    Family History  Problem Relation Age of Onset  . Colon polyps Mother   . Hypertension Father   . Diabetes Father   . Heart disease Father   . Colon polyps Maternal Grandmother   . Heart attack Maternal Grandmother   . Prostate cancer Maternal Grandfather   . Heart attack Paternal Grandfather   . Colon cancer Paternal Grandmother   . Diabetes Maternal Aunt     Allergies  Allergen Reactions  . Sertraline Hcl     REACTION: Headache/ vision changes  . Zoloft [Sertraline Hcl]     Same as Sertraline    Current Outpatient Medications on File Prior to Visit  Medication Sig Dispense Refill  . Blood Glucose Monitoring Suppl (FREESTYLE LITE) DEVI USE AS DIRECTED TO TEST BLOOD SUGAR  0  . Cyanocobalamin (B-12) 5000 MCG CAPS Take by mouth.    . cyclobenzaprine (FLEXERIL) 5 MG tablet TAKE 1 TABLET (5 MG TOTAL) BY MOUTH 3 (THREE) TIMES DAILY AS NEEDED FOR MUSCLE SPASMS. 30 tablet 1  . Esomeprazole Magnesium (NEXIUM 24HR PO) Take 2 capsules by mouth daily. Reported on 3/84/6659    . folic acid (FOLVITE) 935 MCG tablet Take 400 mcg by mouth daily.    Marland Kitchen glipiZIDE (GLUCOTROL) 10 MG tablet Take 1 tablet by mouth twice daily for diabetes. 180 tablet 1  . glucose blood (FREESTYLE LITE) test strip USE AS INSTRUCTED 100 each 3  . glucose monitoring kit (FREESTYLE) monitoring kit 1 each by Does not apply route as needed for other. Use as directe 1 each 0  . Lancets (FREESTYLE) lancets 1 each by Other route as needed for other. Use as instructed    . metFORMIN (GLUCOPHAGE) 1000 MG tablet Take 1 tablet by mouth twice daily with a meal for diabetes. 180 tablet 3  . Multiple Minerals-Vitamins (CALCIUM-MAGNESIUM-ZINC-D3) TABS Take by mouth.    . Omega-3 Fatty Acids (FISH OIL) 1200 MG CPDR Take by mouth.    . ondansetron (ZOFRAN-ODT) 8 MG disintegrating tablet Take 1 tablet (8 mg total) by mouth every 8 (eight) hours as needed for nausea. 10 tablet 0  . rosuvastatin (CRESTOR) 5 MG  tablet Take 1 tablet by mouth every evening for cholesterol. 90 tablet 3  . triamcinolone cream (KENALOG) 0.1 % APPLY 1 APPLICATION TOPICALLY 2 (TWO) TIMES DAILY. 30 g 1  . UNABLE TO FIND Med Name: CBD Oil     No current facility-administered medications on file prior to visit.     BP 116/70   Pulse 76   Temp 97.8 F (36.6 C) (Oral)   Ht '5\' 7"'  (1.702 m)   Wt 197 lb 12  oz (89.7 kg)   LMP 06/17/2011   SpO2 97%   BMI 30.97 kg/m    Objective:   Physical Exam  Constitutional: She appears well-nourished.  Neck: Neck supple.  Cardiovascular: Normal rate and regular rhythm.  Pulmonary/Chest: Effort normal and breath sounds normal.  Skin: Skin is warm and dry.          Assessment & Plan:

## 2017-08-03 NOTE — Addendum Note (Signed)
Addended by: Jacqualin Combes on: 08/03/2017 12:43 PM   Modules accepted: Orders

## 2017-08-03 NOTE — Patient Instructions (Addendum)
Stop by the lab prior to leaving today. I will notify you of your results once received.   Continue to monitor your blood glucose levels. Check your glucose levels: before breakfast, 2 hours after a meal, bedtime. Any of these times are best.  Continue to work on your diet.  Start exercising. You should be getting 150 minutes of moderate intensity exercise weekly.  Continue rosuvastatin for high cholesterol.  Schedule a lab only appointment after June 26th for repeat A1C.   Please schedule a follow up appointment in 6 months.   It was a pleasure to see you today!

## 2017-08-03 NOTE — Assessment & Plan Note (Signed)
Blood glucose levels improving. Discussed to check glucose levels fasting, 2 hours after a meal, bedtime.   Pneumonia vaccination provided today.  Foot and eye exam UTD.  Managed on statin. Urine microalbumin unremarkable.   Repeat A1C in 6 weeks, follow up in 6 months.

## 2017-08-23 ENCOUNTER — Encounter: Payer: Self-pay | Admitting: Internal Medicine

## 2017-08-23 ENCOUNTER — Ambulatory Visit: Payer: Federal, State, Local not specified - PPO | Admitting: Internal Medicine

## 2017-08-23 VITALS — BP 120/78 | HR 76 | Temp 98.2°F | Wt 199.0 lb

## 2017-08-23 DIAGNOSIS — J0110 Acute frontal sinusitis, unspecified: Secondary | ICD-10-CM

## 2017-08-23 MED ORDER — AMOXICILLIN-POT CLAVULANATE 875-125 MG PO TABS: 1.0000 | ORAL_TABLET | Freq: Two times a day (BID) | ORAL | 0 refills | Status: DC

## 2017-08-23 NOTE — Patient Instructions (Signed)

## 2017-08-23 NOTE — Progress Notes (Signed)
HPI  Pt presents to the clinic today with c/o headache, nasal congestion, sore throat and cough. She reports this started 2 weeks ago. She describes the headache as pressure. She is blowing green mucous out of her nose. She denies difficulty swallowing. The cough is non productive. She denies fever, chills or body aches. She has tried Mucinex and Tylenol with minimal relief. She has a history of seasonal allergies. She has had sinus infections in the past and reports this feels the same. She denies sick contacts.  Review of Systems     Past Medical History:  Diagnosis Date  . Allergy    Seasonal  . Anxiety   . Attention deficit disorder without mention of hyperactivity   . Barrett's esophagus   . Congenital anomalies of foot, not elsewhere classified   . Depression   . Diabetes mellitus without complication ([place])   . Displacement of intervertebral disc, site unspecified, without myelopathy   . Diverticulosis   . Elevated blood pressure reading without diagnosis of hypertension   . Esophageal reflux   . Gallstones   . Hiatal hernia   . HLD (hyperlipidemia)   . Hypoglycemia, unspecified   . Internal hemorrhoids without mention of complication   . Other abnormal blood chemistry     Family History  Problem Relation Age of Onset  . Colon polyps Mother   . Hypertension Father   . Diabetes Father   . Heart disease Father   . Colon polyps Maternal Grandmother   . Heart attack Maternal Grandmother   . Prostate cancer Maternal Grandfather   . Heart attack Paternal Grandfather   . Colon cancer Paternal Grandmother   . Diabetes Maternal Aunt     Social History   Socioeconomic History  . Marital status: Married    Spouse name: Not on file  . Number of children: 2  . Years of education: Not on file  . Highest education level: Not on file  Occupational History  . Occupation: Self employeed- Conservation officer, historic buildings  Social Needs  . Financial resource strain: Not on file  . Food  insecurity:    Worry: Not on file    Inability: Not on file  . Transportation needs:    Medical: Not on file    Non-medical: Not on file  Tobacco Use  . Smoking status: Former Smoker    Types: Cigarettes  . Smokeless tobacco: Never Used  . Tobacco comment: 2 years as a teenager  Substance and Sexual Activity  . Alcohol use: Yes    Alcohol/week: 0.0 oz    Comment: occasional wine  . Drug use: No  . Sexual activity: Yes  Lifestyle  . Physical activity:    Days per week: Not on file    Minutes per session: Not on file  . Stress: Not on file  Relationships  . Social connections:    Talks on phone: Not on file    Gets together: Not on file    Attends religious service: Not on file    Active member of club or organization: Not on file    Attends meetings of clubs or organizations: Not on file    Relationship status: Not on file  . Intimate partner violence:    Fear of current or ex partner: Not on file    Emotionally abused: Not on file    Physically abused: Not on file    Forced sexual activity: Not on file  Other Topics Concern  . Not on file  Social History Narrative  . Not on file    Allergies  Allergen Reactions  . Sertraline Hcl     REACTION: Headache/ vision changes  . Zoloft [Sertraline Hcl]     Same as Sertraline     Constitutional: Positive headache. Denies fatigue, fever or abrupt weight changes.  HEENT:  Positive nasal congestion and sore throat. Denies eye redness, ear pain, ringing in the ears, wax buildup, runny nose or bloody nose. Respiratory: Positive cough. Denies difficulty breathing or shortness of breath.  Cardiovascular: Denies chest pain, chest tightness, palpitations or swelling in the hands or feet.   No other specific complaints in a complete review of systems (except as listed in HPI above).  Objective:   BP 120/78   Pulse 76   Temp 98.2 F (36.8 C) (Oral)   Wt 199 lb (90.3 kg)   LMP 06/17/2011   SpO2 98%   BMI 31.17 kg/m    General: Appears her stated age, obese in NAD. HEENT: Head: normal shape and size, frontal sinus tenderness noted; Right Ear: cerumen impaction; Left Ear: Tm's gray and intact, normal light reflex; Nose: mucosa boggy and moist, septum midline; Throat/Mouth: + PND. Teeth present, mucosa erythematous and moist, no exudate noted, no lesions or ulcerations noted.  Neck:  No adenopathy noted.  Cardiovascular: Normal rate and rhythm.  Pulmonary/Chest: Normal effort and positive vesicular breath sounds. No respiratory distress. No wheezes, rales or ronchi noted.       Assessment & Plan:   Acute Frontal Sinusitis:  Can use a Neti Pot which can be purchased from your local drug store. Flonase 2 sprays each nostril for 3 days and then as needed. eRx for Augmentin BID for 10 days  RTC as needed or if symptoms persist. Webb Silversmith, NP

## 2017-08-25 ENCOUNTER — Ambulatory Visit: Payer: Self-pay | Admitting: *Deleted

## 2017-08-25 NOTE — Telephone Encounter (Signed)
Noted and agree to recommendations. Please have patient notify us if her symptoms progress into persistent watery diarrhea.

## 2017-08-25 NOTE — Telephone Encounter (Signed)
Pt was  Seen was by several days ago for sinus infection. Started Augmentin - has taken 3  Doses . Developed loose stools  Yesterday - not severe . No pain - no nausea - . Taking liquids well.  Advised  Continue to drink lots water.Eating yogurt .  Pt advised  I would send a message to provider. Patients  Phone number is [phone] . Pharmacy [facility]    Answer Assessment - Initial Assessment Questions 1. DIARRHEA SEVERITY: "How bad is the diarrhea?" "How many extra stools have you had in the past 24 hours than normal?"    - MILD: Few loose or mushy BMs; increase of 1-3 stools over normal daily number of stools; mild increase in ostomy output.   - MODERATE: Increase of 4-6 stools daily over normal; moderate increase in ostomy output.   - SEVERE (or Worst Possible): Increase of 7 or more stools daily over normal; moderate increase in ostomy output; incontinence.      Loose  Stools  X 2 in  Last  24 hours   2. ONSET: "When did the diarrhea begin?"        Yestarday mi - morning   3. BM CONSISTENCY: "How loose or watery is the diarrhea?"        Loose stool nit  Watery  4. VOMITING: "Are you also vomiting?" If so, ask: "How many times in the past 24 hours?"      No  5. ABDOMINAL PAIN: "Are you having any abdominal pain?" If yes: "What does it feel like?" (e.g., crampy, dull, intermittent, constant)        No 6. ABDOMINAL PAIN SEVERITY: If present, ask: "How bad is the pain?"  (e.g., Scale 1-10; mild, moderate, or severe)    - MILD (1-3): doesn't interfere with normal activities, abdomen soft and not tender to touch     - MODERATE (4-7): interferes with normal activities or awakens from sleep, tender to touch     - SEVERE (8-10): excruciating pain, doubled over, unable to do any normal activities       no 7. ORAL INTAKE: If vomiting, "Have you been able to drink liquids?" "How much fluids have you had in the past 24 hours?"        5  16 oz  Bottles   Water    8. HYDRATION: "Any signs of  dehydration?" (e.g., dry mouth [not just dry lips], too weak to stand, dizziness, new weight loss) "When did you last urinate?"         Last  Urination  1  Hour  Ago  Ago   9. EXPOSURE: "Have you traveled to a foreign country recently?" "Have you been exposed to anyone with diarrhea?" "Could you have eaten any food that was spoiled?"       No  10. OTHER SYMPTOMS: "Do you have any other symptoms?" (e.g., fever, blood in stool)       nO  11. PREGNANCY: "Is there any chance you are pregnant?" "When was your last menstrual period?"      NO  Protocols used: DIARRHEA-A-AH

## 2017-08-26 NOTE — Telephone Encounter (Signed)
Spoken and notified patient of Kate Clark's comments. Patient verbalized understanding.  

## 2017-09-02 ENCOUNTER — Other Ambulatory Visit: Payer: Self-pay | Admitting: Internal Medicine

## 2017-09-02 NOTE — Telephone Encounter (Signed)
There is a note on the refill request from the pt stating she is still having a sore throat.

## 2017-09-02 NOTE — Telephone Encounter (Signed)
Needs re-eval

## 2017-09-09 ENCOUNTER — Telehealth: Payer: Self-pay | Admitting: Primary Care

## 2017-09-09 MED ORDER — FREESTYLE LANCETS MISC: 2 refills | Status: DC

## 2017-09-09 MED ORDER — GLUCOSE BLOOD VI STRP: ORAL_STRIP | 2 refills | Status: DC

## 2017-09-09 MED ORDER — FREESTYLE SYSTEM KIT: PACK | 0 refills | Status: DC

## 2017-09-09 NOTE — Telephone Encounter (Signed)
Copied from [place] [id number]. Topic: Quick Communication - Rx Refill/Question >> Sep 09, 2017  9:45 AM Boyd Kerbs wrote: Medication:  glucose monitoring kit (FREESTYLE) monitoring kit glucose blood (FREESTYLE LITE) test strip  Her monitor and needle broke  Needs this asap  Has the patient contacted their pharmacy? No. (Agent: If no, request that the patient contact the pharmacy for the refill.) (Agent: If yes, when and what did the pharmacy advise?)  Preferred Pharmacy (with phone number or street name):   [id number] [place] [id number]: [phone]: [phone]   Agent: Please be advised that RX refills may take up to 3 business days. We ask that you follow-up with your pharmacy.

## 2017-09-09 NOTE — Telephone Encounter (Signed)
Sent as requested. Notified patient has well.

## 2017-09-09 NOTE — Telephone Encounter (Signed)
LOV  08/23/17 Alma Friendly Last refill for monitor kit 10/15/14

## 2017-09-30 DIAGNOSIS — Z1231 Encounter for screening mammogram for malignant neoplasm of breast: Secondary | ICD-10-CM | POA: Diagnosis not present

## 2017-09-30 DIAGNOSIS — Z01419 Encounter for gynecological examination (general) (routine) without abnormal findings: Secondary | ICD-10-CM | POA: Diagnosis not present

## 2017-09-30 DIAGNOSIS — Z6831 Body mass index (BMI) 31.0-31.9, adult: Secondary | ICD-10-CM | POA: Diagnosis not present

## 2017-10-06 ENCOUNTER — Telehealth: Payer: Self-pay | Admitting: Primary Care

## 2017-10-06 NOTE — Telephone Encounter (Signed)
Pt last seen 08/03/17; last effexor XR 37.5 mg filled # 90 on 01/17/17. Please advise. I spoke with pt and she has some stress going on at home now and wants to take the effexor because in the past it worked well. Pt prefers not to make appt if can have med sent to [place]. No SI/HI.Please advise.

## 2017-10-06 NOTE — Telephone Encounter (Signed)
Copied from [place] [id number]. Topic: General - Other >> Oct 06, 2017  4:35 PM Cecelia Byars, NT wrote: Reason for CRM: Patient called and would Effexor  XR called in for anxiety she has taken this in the past , she is issues causing anxiety please advise [phone]

## 2017-10-06 NOTE — Telephone Encounter (Signed)
Please notify patient that I'm so sorry to hear about her stress and I'm happy to help but we'll need to meet face to face to discuss as I've never treated her for this in the past. Please schedule her for an office visit at her convenience, I'm happy to squeeze her in if needed.

## 2017-10-07 ENCOUNTER — Ambulatory Visit: Payer: Federal, State, Local not specified - PPO | Admitting: Primary Care

## 2017-10-07 ENCOUNTER — Encounter: Payer: Self-pay | Admitting: Primary Care

## 2017-10-07 VITALS — BP 124/72 | HR 102 | Temp 98.0°F | Ht 67.0 in | Wt 199.5 lb

## 2017-10-07 DIAGNOSIS — F411 Generalized anxiety disorder: Secondary | ICD-10-CM | POA: Diagnosis not present

## 2017-10-07 MED ORDER — VENLAFAXINE HCL ER 37.5 MG PO CP24: 37.5000 mg | ORAL_CAPSULE | Freq: Every day | ORAL | 1 refills | Status: DC

## 2017-10-07 NOTE — Telephone Encounter (Signed)
Spoken and notified patient of Whitney Millers comments. Patient verbalized understanding.  We were able to see on 10/07/2017

## 2017-10-07 NOTE — Assessment & Plan Note (Signed)
Managed on Effexor for years, did very well. Recent stress with family likely triggering anxiety. GAD 7 score of 15 today.   Offered therapy for which she kindly declines. Rx for Effexor XR 37.5 mg sent to pharmacy, this seems like very reasonable treatment given that she's done well on it in the past. She will update [place] in 4 weeks.   Denies SI/HI.

## 2017-10-07 NOTE — Patient Instructions (Signed)
Start venlafaxine ER 37.5 mg (Effexor XR). Take 1 capsule by mouth every morning with breakfast.  Please update me via My Chart in 4 weeks or notify me sooner if you have any problems.  It was a pleasure to see you today!

## 2017-10-07 NOTE — Progress Notes (Signed)
Subjective:    Patient ID: Whitney [name], female    DOB: [birth year], 59 y.o.   MRN: [id number]  HPI  Whitney [name] is a 57 year old female with a history of anxiety and depression who presents today to discuss symptoms.  She was previously managed on Effexor 37.5 mg for which she took for several years. She's been off of her medication since January 2019 as she switched to CBD oil and was doing well. She did well on Effexor XR in the past.   Over the past three months she's been under a lot of personal stress with family, things that she cannot control. Symptoms include palpitations, waking up with racing thoughts, feeling more anxious. She feels as though she would benefit from resuming her Effexor. She denies SI/HI. GAD 7 score of 15 today.  Review of Systems  Respiratory: Negative for shortness of breath.   Cardiovascular: Negative for chest pain.  Psychiatric/Behavioral: Positive for sleep disturbance. Negative for suicidal ideas. The patient is nervous/anxious.        See HPI       Past Medical History:  Diagnosis Date  . Allergy    Seasonal  . Anxiety   . Attention deficit disorder without mention of hyperactivity   . Barrett's esophagus   . Congenital anomalies of foot, not elsewhere classified   . Depression   . Diabetes mellitus without complication ([place])   . Displacement of intervertebral disc, site unspecified, without myelopathy   . Diverticulosis   . Elevated blood pressure reading without diagnosis of hypertension   . Esophageal reflux   . Gallstones   . Hiatal hernia   . HLD (hyperlipidemia)   . Hypoglycemia, unspecified   . Internal hemorrhoids without mention of complication   . Other abnormal blood chemistry      Social History   Socioeconomic History  . Marital status: Married    Spouse name: Not on file  . Number of children: 2  . Years of education: Not on file  . Highest education level: Not on file  Occupational History  . Occupation: Self  employeed- Conservation officer, historic buildings  Social Needs  . Financial resource strain: Not on file  . Food insecurity:    Worry: Not on file    Inability: Not on file  . Transportation needs:    Medical: Not on file    Non-medical: Not on file  Tobacco Use  . Smoking status: Former Smoker    Types: Cigarettes  . Smokeless tobacco: Never Used  . Tobacco comment: 2 years as a teenager  Substance and Sexual Activity  . Alcohol use: Yes    Alcohol/week: 0.0 oz    Comment: occasional wine  . Drug use: No  . Sexual activity: Yes  Lifestyle  . Physical activity:    Days per week: Not on file    Minutes per session: Not on file  . Stress: Not on file  Relationships  . Social connections:    Talks on phone: Not on file    Gets together: Not on file    Attends religious service: Not on file    Active member of club or organization: Not on file    Attends meetings of clubs or organizations: Not on file    Relationship status: Not on file  . Intimate partner violence:    Fear of current or ex partner: Not on file    Emotionally abused: Not on file    Physically abused: Not on file  Forced sexual activity: Not on file  Other Topics Concern  . Not on file  Social History Narrative  . Not on file    Past Surgical History:  Procedure Laterality Date  . BREAST CYST EXCISION Left   . CARDIAC CATHETERIZATION     [facility]  . CESAREAN SECTION    . CHOLECYSTECTOMY  06/16/2007   Dr. Hulen Skains  . COLONOSCOPY  02/06/2002   diverticula and internal hemorroids  . HIATAL HERNIA REPAIR    . LUMBAR LAMINECTOMY     L5-S1    Family History  Problem Relation Age of Onset  . Colon polyps Mother   . Hypertension Father   . Diabetes Father   . Heart disease Father   . Colon polyps Maternal Grandmother   . Heart attack Maternal Grandmother   . Prostate cancer Maternal Grandfather   . Heart attack Paternal Grandfather   . Colon cancer Paternal Grandmother   . Diabetes Maternal Aunt     Allergies    Allergen Reactions  . Sertraline Hcl     REACTION: Headache/ vision changes  . Zoloft [Sertraline Hcl]     Same as Sertraline    Current Outpatient Medications on File Prior to Visit  Medication Sig Dispense Refill  . Blood Glucose Monitoring Suppl (FREESTYLE LITE) DEVI USE AS DIRECTED TO TEST BLOOD SUGAR  0  . Cyanocobalamin (B-12) 5000 MCG CAPS Take by mouth.    . cyclobenzaprine (FLEXERIL) 5 MG tablet TAKE 1 TABLET (5 MG TOTAL) BY MOUTH 3 (THREE) TIMES DAILY AS NEEDED FOR MUSCLE SPASMS. 30 tablet 1  . Esomeprazole Magnesium (NEXIUM 24HR PO) Take 2 capsules by mouth daily. Reported on 06/19/5571    . folic acid (FOLVITE) 220 MCG tablet Take 400 mcg by mouth daily.    Marland Kitchen glipiZIDE (GLUCOTROL) 10 MG tablet Take 1 tablet by mouth twice daily for diabetes. 180 tablet 1  . glucose blood (FREESTYLE LITE) test strip Use as instructed to test blood sugar 3 times daily 100 each 2  . glucose monitoring kit (FREESTYLE) monitoring kit Use as instructed to test blood sugar daily 1 each 0  . Lancets (FREESTYLE) lancets Use as instructed to test blood sugar 3 times daily 100 each 2  . metFORMIN (GLUCOPHAGE) 1000 MG tablet Take 1 tablet by mouth twice daily with a meal for diabetes. 180 tablet 3  . Multiple Minerals-Vitamins (CALCIUM-MAGNESIUM-ZINC-D3) TABS Take by mouth.    . Omega-3 Fatty Acids (FISH OIL) 1200 MG CPDR Take by mouth.    . ondansetron (ZOFRAN-ODT) 8 MG disintegrating tablet Take 1 tablet (8 mg total) by mouth every 8 (eight) hours as needed for nausea. 10 tablet 0  . rosuvastatin (CRESTOR) 5 MG tablet Take 1 tablet by mouth every evening for cholesterol. 90 tablet 3  . triamcinolone cream (KENALOG) 0.1 % APPLY 1 APPLICATION TOPICALLY 2 (TWO) TIMES DAILY. 30 g 1  . UNABLE TO FIND Med Name: CBD Oil     No current facility-administered medications on file prior to visit.     BP 124/72   Pulse (!) 102   Temp 98 F (36.7 C) (Oral)   Ht '5\' 7"'  (1.702 m)   Wt 199 lb 8 oz (90.5 kg)   LMP  06/17/2011   SpO2 98%   BMI 31.25 kg/m    Objective:   Physical Exam  Constitutional: She appears well-nourished.  Neck: Neck supple.  Cardiovascular: Normal rate.  Respiratory: Effort normal.  Skin: Skin is warm and dry.  Psychiatric:  She has a normal mood and affect.           Assessment & Plan:

## 2017-10-10 ENCOUNTER — Ambulatory Visit: Payer: Federal, State, Local not specified - PPO | Admitting: Family Medicine

## 2017-11-07 ENCOUNTER — Encounter: Payer: Self-pay | Admitting: Family Medicine

## 2017-11-07 ENCOUNTER — Ambulatory Visit: Payer: Federal, State, Local not specified - PPO | Admitting: Family Medicine

## 2017-11-07 VITALS — BP 132/80 | HR 89 | Temp 97.7°F | Ht 67.0 in | Wt 200.5 lb

## 2017-11-07 DIAGNOSIS — R0981 Nasal congestion: Secondary | ICD-10-CM

## 2017-11-07 DIAGNOSIS — J020 Streptococcal pharyngitis: Secondary | ICD-10-CM

## 2017-11-07 DIAGNOSIS — J029 Acute pharyngitis, unspecified: Secondary | ICD-10-CM

## 2017-11-07 LAB — POCT RAPID STREP A (OFFICE): Rapid Strep A Screen: POSITIVE — AB

## 2017-11-07 MED ORDER — AMOXICILLIN 500 MG PO CAPS: 500.0000 mg | ORAL_CAPSULE | Freq: Two times a day (BID) | ORAL | 0 refills | Status: DC

## 2017-11-07 MED ORDER — FLUTICASONE PROPIONATE 50 MCG/ACT NA SUSP: 2.0000 | Freq: Every day | NASAL | 6 refills | Status: DC

## 2017-11-07 NOTE — Progress Notes (Signed)
Subjective:    Patient ID: Whitney [name], female    DOB: Dec 09, 1960, 57 y.o.   MRN: [id number]  HPI This is a 57 yo female who presents today with 2 weeks of bilateral ear pain, achy, feel stopped up. Nasal congestion, no nasal drainage. Some fatigue. Sore throat x 4 days, getting worse. No cough. Son in law with strep. Taking tylenol with little relief. No SOB, no wheeze.    Past Medical History:  Diagnosis Date  . Allergy    Seasonal  . Anxiety   . Attention deficit disorder without mention of hyperactivity   . Barrett's esophagus   . Congenital anomalies of foot, not elsewhere classified   . Depression   . Diabetes mellitus without complication ([place])   . Displacement of intervertebral disc, site unspecified, without myelopathy   . Diverticulosis   . Elevated blood pressure reading without diagnosis of hypertension   . Esophageal reflux   . Gallstones   . Hiatal hernia   . HLD (hyperlipidemia)   . Hypoglycemia, unspecified   . Internal hemorrhoids without mention of complication   . Other abnormal blood chemistry    Past Surgical History:  Procedure Laterality Date  . BREAST CYST EXCISION Left   . CARDIAC CATHETERIZATION     [facility]  . CESAREAN SECTION    . CHOLECYSTECTOMY  06/16/2007   Dr. Hulen Skains  . COLONOSCOPY  02/06/2002   diverticula and internal hemorroids  . HIATAL HERNIA REPAIR    . LUMBAR LAMINECTOMY     L5-S1   Family History  Problem Relation Age of Onset  . Colon polyps Mother   . Hypertension Father   . Diabetes Father   . Heart disease Father   . Colon polyps Maternal Grandmother   . Heart attack Maternal Grandmother   . Prostate cancer Maternal Grandfather   . Heart attack Paternal Grandfather   . Colon cancer Paternal Grandmother   . Diabetes Maternal Aunt    Social History   Tobacco Use  . Smoking status: Former Smoker    Types: Cigarettes  . Smokeless tobacco: Never Used  . Tobacco comment: 2 years as a teenager  Substance Use Topics  .  Alcohol use: Yes    Alcohol/week: 0.0 standard drinks    Comment: occasional wine  . Drug use: No      Review of Systems Per HPI    Objective:   Physical Exam  Constitutional: She is oriented to person, place, and time. She appears well-developed and well-nourished.  HENT:  Head: Normocephalic and atraumatic.  Right Ear: External ear and ear canal normal.  Left Ear: External ear and ear canal normal.  Nose: Nose normal.  Mouth/Throat: Uvula is midline and mucous membranes are normal. Posterior oropharyngeal erythema present. No oropharyngeal exudate or posterior oropharyngeal edema.  Bilateral canals dull, small amount cerumen left ear.   Eyes: Conjunctivae are normal.  Neck: Normal range of motion. Neck supple.  Cardiovascular: Normal rate, regular rhythm and normal heart sounds.  Pulmonary/Chest: Effort normal and breath sounds normal.  Lymphadenopathy:    She has cervical adenopathy.  Neurological: She is alert and oriented to person, place, and time.  Skin: Skin is warm and dry.  Psychiatric: She has a normal mood and affect. Her behavior is normal. Judgment and thought content normal.  Vitals reviewed.     BP 132/80 (BP Location: Right Arm, Patient Position: Sitting, Cuff Size: Normal)   Pulse 89   Temp 97.7 F (36.5 C) (Oral)  Ht 5\' 7"  (1.702 m)   Wt 200 lb 8 oz (90.9 kg)   LMP 06/17/2011   SpO2 97%   BMI 31.40 kg/m      Results for orders placed or performed in visit on 11/07/17  POCT rapid strep A  Result Value Ref Range   Rapid Strep A Screen Positive (A) Negative    Assessment & Plan:  1. Sore throat - POCT rapid strep A  2. Nasal congestion - fluticasone (FLONASE) 50 MCG/ACT nasal spray; Place 2 sprays into both nostrils daily.  Dispense: 16 g; Refill: 6  3. Strep pharyngitis - Provided written and verbal information regarding diagnosis and treatment. - RTC/ER precautions reviewed - amoxicillin (AMOXIL) 500 MG capsule; Take 1 capsule (500 mg  total) by mouth 2 (two) times daily.  Dispense: 20 capsule; Refill: 0   Clarene Reamer, FNP-BC  [facility]  11/07/2017 10:44 AM

## 2017-11-07 NOTE — Patient Instructions (Signed)
Can try ibuprofen for sore throat pain- 2-3 tablets every 8 to 12 hours Gargle warm with salt water Wash sheets, towels, toothbrushes after 24 hours of antibiotic   Strep Throat Strep throat is a bacterial infection of the throat. Your health care provider may call the infection tonsillitis or pharyngitis, depending on whether there is swelling in the tonsils or at the back of the throat. Strep throat is most common during the cold months of the year in children who are 29-62 years of age, but it can happen during any season in people of any age. This infection is spread from person to person (contagious) through coughing, sneezing, or close contact. What are the causes? Strep throat is caused by the bacteria called Streptococcus pyogenes. What increases the risk? This condition is more likely to develop in:  People who spend time in crowded places where the infection can spread easily.  People who have close contact with someone who has strep throat.  What are the signs or symptoms? Symptoms of this condition include:  Fever or chills.  Redness, swelling, or pain in the tonsils or throat.  Pain or difficulty when swallowing.  White or yellow spots on the tonsils or throat.  Swollen, tender glands in the neck or under the jaw.  Red rash all over the body (rare).  How is this diagnosed? This condition is diagnosed by performing a rapid strep test or by taking a swab of your throat (throat culture test). Results from a rapid strep test are usually ready in a few minutes, but throat culture test results are available after one or two days. How is this treated? This condition is treated with antibiotic medicine. Follow these instructions at home: Medicines  Take over-the-counter and prescription medicines only as told by your health care provider.  Take your antibiotic as told by your health care provider. Do not stop taking the antibiotic even if you start to feel better.  Have  family members who also have a sore throat or fever tested for strep throat. They may need antibiotics if they have the strep infection. Eating and drinking  Do not share food, drinking cups, or personal items that could cause the infection to spread to other people.  If swallowing is difficult, try eating soft foods until your sore throat feels better.  Drink enough fluid to keep your urine clear or pale yellow. General instructions  Gargle with a salt-water mixture 3-4 times per day or as needed. To make a salt-water mixture, completely dissolve -1 tsp of salt in 1 cup of warm water.  Make sure that all household members wash their hands well.  Get plenty of rest.  Stay home from school or work until you have been taking antibiotics for 24 hours.  Keep all follow-up visits as told by your health care provider. This is important. Contact a health care provider if:  The glands in your neck continue to get bigger.  You develop a rash, cough, or earache.  You cough up a thick liquid that is green, yellow-brown, or bloody.  You have pain or discomfort that does not get better with medicine.  Your problems seem to be getting worse rather than better.  You have a fever. Get help right away if:  You have new symptoms, such as vomiting, severe headache, stiff or painful neck, chest pain, or shortness of breath.  You have severe throat pain, drooling, or changes in your voice.  You have swelling of the neck,  or the skin on the neck becomes red and tender.  You have signs of dehydration, such as fatigue, dry mouth, and decreased urination.  You become increasingly sleepy, or you cannot wake up completely.  Your joints become red or painful. This information is not intended to replace advice given to you by your health care provider. Make sure you discuss any questions you have with your health care provider. Document Released: 03/12/2000 Document Revised: 11/12/2015 Document  Reviewed: 07/08/2014 Elsevier Interactive Patient Education  [employer].

## 2017-11-23 ENCOUNTER — Telehealth: Payer: Self-pay | Admitting: Primary Care

## 2017-11-23 DIAGNOSIS — E119 Type 2 diabetes mellitus without complications: Secondary | ICD-10-CM

## 2017-11-23 DIAGNOSIS — E785 Hyperlipidemia, unspecified: Secondary | ICD-10-CM

## 2017-11-23 MED ORDER — ROSUVASTATIN CALCIUM 5 MG PO TABS: 5.0000 mg | ORAL_TABLET | Freq: Every day | ORAL | 0 refills | Status: DC

## 2017-11-23 MED ORDER — METFORMIN HCL 1000 MG PO TABS: 1000.0000 mg | ORAL_TABLET | Freq: Two times a day (BID) | ORAL | 0 refills | Status: DC

## 2017-11-23 MED ORDER — GLIPIZIDE 10 MG PO TABS: 10.0000 mg | ORAL_TABLET | Freq: Two times a day (BID) | ORAL | 0 refills | Status: DC

## 2017-11-23 NOTE — Telephone Encounter (Signed)
Copied from [place] [id number]. Topic: Quick Communication - See Telephone Encounter >> Nov 23, 2017  9:49 AM Ahmed Prima L wrote: CRM for notification. See Telephone encounter for: 11/23/17.  Patient states she is out of town on a family emergency and packed enough meds for what she anticpiated to stay for. She is having to stay longer and would like to know if Anda Kraft would call her in a partial re-fill to get her through the end of the week (Saturday). Please call her and let her know either way [phone])  rosuvastatin (CRESTOR) 5 MG tablet glipiZIDE (GLUCOTROL) 10 MG tablet metFORMIN (GLUCOPHAGE) 1000 MG tablet  [facility] [address] [place] , [phone].

## 2017-11-23 NOTE — Telephone Encounter (Signed)
Please notify patient that I sent in refills to her pharmacy. I'm assuming this is in [place], [place] as there was no town or state left with the prior message. Will you please confirm? Thanks.

## 2017-11-23 NOTE — Telephone Encounter (Signed)
Spoken and notified patient of Whitney [name] comments. Patient stated that she will pick up the [facility]

## 2017-11-23 NOTE — Telephone Encounter (Signed)
Noted  

## 2017-12-16 ENCOUNTER — Other Ambulatory Visit: Payer: Self-pay | Admitting: Primary Care

## 2017-12-16 DIAGNOSIS — E119 Type 2 diabetes mellitus without complications: Secondary | ICD-10-CM

## 2018-01-24 DIAGNOSIS — K080 Exfoliation of teeth due to systemic causes: Secondary | ICD-10-CM | POA: Diagnosis not present

## 2018-01-31 ENCOUNTER — Encounter: Payer: Self-pay | Admitting: Internal Medicine

## 2018-01-31 ENCOUNTER — Ambulatory Visit: Payer: Federal, State, Local not specified - PPO | Admitting: Internal Medicine

## 2018-01-31 VITALS — BP 124/84 | HR 76 | Temp 98.6°F | Wt 201.0 lb

## 2018-01-31 DIAGNOSIS — J0100 Acute maxillary sinusitis, unspecified: Secondary | ICD-10-CM | POA: Diagnosis not present

## 2018-01-31 MED ORDER — CEFDINIR 300 MG PO CAPS: 300.0000 mg | ORAL_CAPSULE | Freq: Two times a day (BID) | ORAL | 0 refills | Status: DC

## 2018-01-31 MED ORDER — METHYLPREDNISOLONE ACETATE 80 MG/ML IJ SUSP
80.0000 mg | Freq: Once | INTRAMUSCULAR | Status: AC
  Administered 2018-01-31: 80 mg via INTRAMUSCULAR

## 2018-01-31 NOTE — Progress Notes (Signed)
HPI  Pt presents to the clinic today with c/o headache, facial pressure, ear fullness, nasal congestion, sore throat and cough. She reports this started 1 week ago. She describes the headache as pressure in the forehead. She is not blowing anything out of her nose. She denies drainage or decreased hearing. She denies difficulty swallowing. The cough is non productive. She denies fever, chills or body aches. She has a history of allergies and DM 2. She has had sick contacts.  Review of Systems     Past Medical History:  Diagnosis Date  . Allergy    Seasonal  . Anxiety   . Attention deficit disorder without mention of hyperactivity   . Barrett's esophagus   . Congenital anomalies of foot, not elsewhere classified   . Depression   . Diabetes mellitus without complication ([place])   . Displacement of intervertebral disc, site unspecified, without myelopathy   . Diverticulosis   . Elevated blood pressure reading without diagnosis of hypertension   . Esophageal reflux   . Gallstones   . Hiatal hernia   . HLD (hyperlipidemia)   . Hypoglycemia, unspecified   . Internal hemorrhoids without mention of complication   . Other abnormal blood chemistry     Family History  Problem Relation Age of Onset  . Colon polyps Mother   . Hypertension Father   . Diabetes Father   . Heart disease Father   . Colon polyps Maternal Grandmother   . Heart attack Maternal Grandmother   . Prostate cancer Maternal Grandfather   . Heart attack Paternal Grandfather   . Colon cancer Paternal Grandmother   . Diabetes Maternal Aunt     Social History   Socioeconomic History  . Marital status: Married    Spouse name: Not on file  . Number of children: 2  . Years of education: Not on file  . Highest education level: Not on file  Occupational History  . Occupation: Self employeed- Conservation officer, historic buildings  Social Needs  . Financial resource strain: Not on file  . Food insecurity:    Worry: Not on file   Inability: Not on file  . Transportation needs:    Medical: Not on file    Non-medical: Not on file  Tobacco Use  . Smoking status: Former Smoker    Types: Cigarettes  . Smokeless tobacco: Never Used  . Tobacco comment: 2 years as a teenager  Substance and Sexual Activity  . Alcohol use: Yes    Alcohol/week: 0.0 standard drinks    Comment: occasional wine  . Drug use: No  . Sexual activity: Yes  Lifestyle  . Physical activity:    Days per week: Not on file    Minutes per session: Not on file  . Stress: Not on file  Relationships  . Social connections:    Talks on phone: Not on file    Gets together: Not on file    Attends religious service: Not on file    Active member of club or organization: Not on file    Attends meetings of clubs or organizations: Not on file    Relationship status: Not on file  . Intimate partner violence:    Fear of current or ex partner: Not on file    Emotionally abused: Not on file    Physically abused: Not on file    Forced sexual activity: Not on file  Other Topics Concern  . Not on file  Social History Narrative  . Not on file  Allergies  Allergen Reactions  . Sertraline Hcl     REACTION: Headache/ vision changes  . Zoloft [Sertraline Hcl]     Same as Sertraline     Constitutional: Positive headache. Denies fatigue, fever or abrupt weight changes.  HEENT:  Positive  facial pain, nasal congestion and sore throat. Denies eye redness, ear pain, ringing in the ears, wax buildup, runny nose or bloody nose. Respiratory: Positive cough. Denies difficulty breathing or shortness of breath.  Cardiovascular: Denies chest pain, chest tightness, palpitations or swelling in the hands or feet.   No other specific complaints in a complete review of systems (except as listed in HPI above).  Objective:   BP 124/84   Pulse 76   Temp 98.6 F (37 C) (Oral)   Wt 201 lb (91.2 kg)   LMP 06/17/2011   SpO2 98%   BMI 31.48 kg/m   General: Appears  her stated age, ill appearing in NAD. HEENT: Head: normal shape and size, maxillary sinus tenderness noted;  Ears: Tm's red but intact, normal light reflex; Nose: mucosa boggy and moist, septum midline; Throat/Mouth: + PND. Teeth present, mucosa erythematous and moist, no exudate noted, no lesions or ulcerations noted.  Neck:  No adenopathy noted.  Cardiovascular: Normal rate and rhythm.  Pulmonary/Chest: Normal effort and positive vesicular breath sounds. No respiratory distress. No wheezes, rales or ronchi noted.       Assessment & Plan:   Acute Maxillary Sinusitis  Can use a Neti Pot which can be purchased from your local drug store. Flonase 2 sprays each nostril for 3 days and then as needed. 80 mg Depo IM today eRx for Omnicef BID for 10 days  RTC as needed or if symptoms persist. Webb Silversmith, NP

## 2018-01-31 NOTE — Patient Instructions (Signed)

## 2018-02-02 ENCOUNTER — Encounter: Payer: Self-pay | Admitting: Internal Medicine

## 2018-02-15 DIAGNOSIS — K080 Exfoliation of teeth due to systemic causes: Secondary | ICD-10-CM | POA: Diagnosis not present

## 2018-03-17 ENCOUNTER — Other Ambulatory Visit: Payer: Self-pay | Admitting: Primary Care

## 2018-03-17 DIAGNOSIS — E119 Type 2 diabetes mellitus without complications: Secondary | ICD-10-CM

## 2018-03-17 NOTE — Telephone Encounter (Signed)
Patient is overdue for diabetes follow-up, please schedule a follow-up visit as soon as possible. Refill sent to pharmacy.

## 2018-03-17 NOTE — Telephone Encounter (Signed)
Last prescribed on 11/23/2017  Last office visit on 01/31/2018 with [facility] for acute

## 2018-03-31 ENCOUNTER — Ambulatory Visit: Payer: Federal, State, Local not specified - PPO | Admitting: Primary Care

## 2018-03-31 ENCOUNTER — Encounter: Payer: Self-pay | Admitting: Primary Care

## 2018-03-31 VITALS — BP 120/78 | HR 78 | Temp 98.2°F | Ht 67.0 in | Wt 201.5 lb

## 2018-03-31 DIAGNOSIS — F411 Generalized anxiety disorder: Secondary | ICD-10-CM

## 2018-03-31 DIAGNOSIS — E119 Type 2 diabetes mellitus without complications: Secondary | ICD-10-CM

## 2018-03-31 DIAGNOSIS — F331 Major depressive disorder, recurrent, moderate: Secondary | ICD-10-CM | POA: Diagnosis not present

## 2018-03-31 DIAGNOSIS — Z23 Encounter for immunization: Secondary | ICD-10-CM | POA: Diagnosis not present

## 2018-03-31 LAB — MICROALBUMIN / CREATININE URINE RATIO
Creatinine,U: 193 mg/dL
Microalb Creat Ratio: 0.6 mg/g (ref 0.0–30.0)
Microalb, Ur: 1.1 mg/dL (ref 0.0–1.9)

## 2018-03-31 LAB — POCT GLYCOSYLATED HEMOGLOBIN (HGB A1C): Hemoglobin A1C: 7.4 % — AB (ref 4.0–5.6)

## 2018-03-31 MED ORDER — DESVENLAFAXINE SUCCINATE ER 50 MG PO TB24: 50.0000 mg | ORAL_TABLET | Freq: Every day | ORAL | 1 refills | Status: DC

## 2018-03-31 NOTE — Progress Notes (Signed)
Subjective:    Patient ID: Whitney [name], female    DOB: 1960/04/13, 58 y.o.   MRN: [id number]  HPI  Whitney [name] is a 58 year old female who presents today for follow up  1) Type 2 Diabetes:  Current medications include: Glipizide 10 mg BID, Metformin 1000 mg BID  She is checking her blood glucose 2-3 times weekly and is getting readings of: 2 hours after breakfast: 150-250's.  Last A1C: 9.9 in March 2019, 7.4 today. Last Eye Exam: Completed in March 2019 Last Foot Exam: Completed in May 2019 Pneumonia Vaccination: Completed in 2019 ACE/ARB: Urine microalbumin negative in March 2019 Statin: rosuvastatin  Diet currently consists of:  Breakfast: Boiled egg, toast, eggs Lunch: Salad with protein, chicken salad, fish Dinner: Chicken, fish, vegetables Snacks: Nuts, dried fruit, fruit Desserts: Less than once weekly  Beverages: Some water, un-sweet tea, diet soda  Exercise: She recently purchased an elliptical machine and plans on exercising   Wt Readings from Last 3 Encounters:  03/31/18 201 lb 8 oz (91.4 kg)  01/31/18 201 lb (91.2 kg)  11/07/17 200 lb 8 oz (90.9 kg)      2) GAD/Depression: Previously managed on Effexor 37.5 mg BID for several years, weaned herself off of this medication as she didn't feel as though it was working, started using CBD oil. The CBD oil helped with her symptoms for the most part until just recently.  She endorses symptoms of difficulty sleeping, difficulty concentrating and prioritizing, anxiety to the point of "going numb", fatigue, mind racing thoughts. She's been under a lot of stress in both her work and personal life. Family history of anxiety/depression in her sister who failed numerous treatments in numerous classes. Her sister was then placed on Pristiq and did very well, she is interested in trying.    PHQ 9 score of 20 and GAD 7 score of 7 today. Denies SI/HI.  Review of Systems  Respiratory: Negative for shortness of breath.     Cardiovascular: Negative for chest pain.  Neurological: Negative for dizziness.       Intermittent numbness to plantar feet  Psychiatric/Behavioral: Negative for suicidal ideas.       See HPI       Past Medical History:  Diagnosis Date  . Allergy    Seasonal  . Anxiety   . Attention deficit disorder without mention of hyperactivity   . Barrett's esophagus   . Congenital anomalies of foot, not elsewhere classified   . Depression   . Diabetes mellitus without complication ([place])   . Displacement of intervertebral disc, site unspecified, without myelopathy   . Diverticulosis   . Elevated blood pressure reading without diagnosis of hypertension   . Esophageal reflux   . Gallstones   . Hiatal hernia   . HLD (hyperlipidemia)   . Hypoglycemia, unspecified   . Internal hemorrhoids without mention of complication   . Other abnormal blood chemistry      Social History   Socioeconomic History  . Marital status: Married    Spouse name: Not on file  . Number of children: 2  . Years of education: Not on file  . Highest education level: Not on file  Occupational History  . Occupation: Self employeed- Conservation officer, historic buildings  Social Needs  . Financial resource strain: Not on file  . Food insecurity:    Worry: Not on file    Inability: Not on file  . Transportation needs:    Medical: Not on file  Non-medical: Not on file  Tobacco Use  . Smoking status: Former Smoker    Types: Cigarettes  . Smokeless tobacco: Never Used  . Tobacco comment: 2 years as a teenager  Substance and Sexual Activity  . Alcohol use: Yes    Alcohol/week: 0.0 standard drinks    Comment: occasional wine  . Drug use: No  . Sexual activity: Yes  Lifestyle  . Physical activity:    Days per week: Not on file    Minutes per session: Not on file  . Stress: Not on file  Relationships  . Social connections:    Talks on phone: Not on file    Gets together: Not on file    Attends religious service: Not on file     Active member of club or organization: Not on file    Attends meetings of clubs or organizations: Not on file    Relationship status: Not on file  . Intimate partner violence:    Fear of current or ex partner: Not on file    Emotionally abused: Not on file    Physically abused: Not on file    Forced sexual activity: Not on file  Other Topics Concern  . Not on file  Social History Narrative  . Not on file    Past Surgical History:  Procedure Laterality Date  . BREAST CYST EXCISION Left   . CARDIAC CATHETERIZATION     [facility]  . CESAREAN SECTION    . CHOLECYSTECTOMY  06/16/2007   Dr. Hulen Skains  . COLONOSCOPY  02/06/2002   diverticula and internal hemorroids  . HIATAL HERNIA REPAIR    . LUMBAR LAMINECTOMY     L5-S1    Family History  Problem Relation Age of Onset  . Colon polyps Mother   . Hypertension Father   . Diabetes Father   . Heart disease Father   . Colon polyps Maternal Grandmother   . Heart attack Maternal Grandmother   . Prostate cancer Maternal Grandfather   . Heart attack Paternal Grandfather   . Colon cancer Paternal Grandmother   . Diabetes Maternal Aunt     Allergies  Allergen Reactions  . Sertraline Hcl     REACTION: Headache/ vision changes  . Zoloft [Sertraline Hcl]     Same as Sertraline    Current Outpatient Medications on File Prior to Visit  Medication Sig Dispense Refill  . Blood Glucose Monitoring Suppl (FREESTYLE LITE) DEVI USE AS DIRECTED TO TEST BLOOD SUGAR  0  . cefdinir (OMNICEF) 300 MG capsule Take 1 capsule (300 mg total) by mouth 2 (two) times daily. 20 capsule 0  . Cyanocobalamin (B-12) 5000 MCG CAPS Take by mouth.    . cyclobenzaprine (FLEXERIL) 5 MG tablet TAKE 1 TABLET (5 MG TOTAL) BY MOUTH 3 (THREE) TIMES DAILY AS NEEDED FOR MUSCLE SPASMS. 30 tablet 1  . Esomeprazole Magnesium (NEXIUM 24HR PO) Take 2 capsules by mouth daily. Reported on 04/16/2015    . fluticasone (FLONASE) 50 MCG/ACT nasal spray Place 2 sprays into both  nostrils daily. 16 g 6  . folic acid (FOLVITE) 038 MCG tablet Take 400 mcg by mouth daily.    Marland Kitchen glipiZIDE (GLUCOTROL) 10 MG tablet TAKE 1 TABLET BY MOUTH TWICE DAILY FOR DIABETES. 60 tablet 0  . glucose blood (FREESTYLE LITE) test strip Use as instructed to test blood sugar 3 times daily 100 each 2  . glucose monitoring kit (FREESTYLE) monitoring kit Use as instructed to test blood sugar daily 1 each  0  . Lancets (FREESTYLE) lancets Use as instructed to test blood sugar 3 times daily 100 each 2  . metFORMIN (GLUCOPHAGE) 1000 MG tablet Take 1 tablet (1,000 mg total) by mouth 2 (two) times daily with a meal. 14 tablet 0  . Multiple Minerals-Vitamins (CALCIUM-MAGNESIUM-ZINC-D3) TABS Take by mouth.    . Omega-3 Fatty Acids (FISH OIL) 1200 MG CPDR Take by mouth.    . ondansetron (ZOFRAN-ODT) 8 MG disintegrating tablet Take 1 tablet (8 mg total) by mouth every 8 (eight) hours as needed for nausea. 10 tablet 0  . rosuvastatin (CRESTOR) 5 MG tablet Take 1 tablet (5 mg total) by mouth daily. 7 tablet 0  . triamcinolone cream (KENALOG) 0.1 % APPLY 1 APPLICATION TOPICALLY 2 (TWO) TIMES DAILY. 30 g 1  . UNABLE TO FIND Med Name: CBD Oil     No current facility-administered medications on file prior to visit.     BP 120/78   Pulse 78   Temp 98.2 F (36.8 C) (Oral)   Ht '5\' 7"'  (1.702 m)   Wt 201 lb 8 oz (91.4 kg)   LMP 06/17/2011   SpO2 98%   BMI 31.56 kg/m    Objective:   Physical Exam  Constitutional: She appears well-nourished.  Neck: Neck supple.  Cardiovascular: Normal rate and regular rhythm.  Respiratory: Effort normal and breath sounds normal.  Skin: Skin is warm and dry.  Psychiatric: She has a normal mood and affect.           Assessment & Plan:

## 2018-03-31 NOTE — Assessment & Plan Note (Signed)
Gad 7 score of 7 today. Mostly depressive symptoms. Didn't feel as though Effexor helped in the past, would like to try Pristiq. Rx for Pristiq 50 mg sent to pharmacy.  Patient is to take 1/2 tablet daily for 6 days, then advance to 1 full tablet thereafter. We discussed possible side effects of headache, GI upset, drowsiness, and SI/HI. If thoughts of SI/HI develop, we discussed to present to the emergency immediately. Patient verbalized understanding.   Follow up in 6 weeks for re-evaluation.

## 2018-03-31 NOTE — Patient Instructions (Signed)
Continue Metformin and Glipizide for diabetes.  Start exercising. You should be getting 150 minutes of moderate intensity exercise weekly.  Ensure you are consuming 64 ounces of water daily.  Stop by the lab prior to leaving today. I will notify you of your results once received.   Start Pristiq for depression. Take 1/2 tablet daily for 6 days, then increase to 1 full tablet thereafter.  Schedule a follow up visit in 6 weeks for re-evaluation of depression. Also schedule a follow up visit for 6 months for diabetes check.  It was a pleasure to see you today!

## 2018-03-31 NOTE — Assessment & Plan Note (Signed)
PHQ 9 score of 20. Didn't feel as though Effexor helped in the past, would like to try Pristiq. Rx for Pristiq 50 mg sent to pharmacy.  Patient is to take 1/2 tablet daily for 6 days, then advance to 1 full tablet thereafter. We discussed possible side effects of headache, GI upset, drowsiness, and SI/HI. If thoughts of SI/HI develop, we discussed to present to the emergency immediately. Patient verbalized understanding.   Follow up in 6 weeks for re-evaluation.

## 2018-03-31 NOTE — Assessment & Plan Note (Signed)
A1C improved compared to last visit. Encouraged her to continue to work on her diet, start exercising regularly.  Urine microalbumin pending. Managed on statin. Eye exam and foot exam UTD.  Follow up in 6 months for diabetes check. Continue Metformin and Glipizide.

## 2018-03-31 NOTE — Addendum Note (Signed)
Addended by: Jacqualin Combes on: 03/31/2018 10:18 AM   Modules accepted: Orders

## 2018-04-14 ENCOUNTER — Other Ambulatory Visit: Payer: Self-pay | Admitting: Primary Care

## 2018-04-14 DIAGNOSIS — E119 Type 2 diabetes mellitus without complications: Secondary | ICD-10-CM

## 2018-04-22 ENCOUNTER — Other Ambulatory Visit: Payer: Self-pay | Admitting: Primary Care

## 2018-04-22 DIAGNOSIS — F411 Generalized anxiety disorder: Secondary | ICD-10-CM

## 2018-04-22 DIAGNOSIS — F331 Major depressive disorder, recurrent, moderate: Secondary | ICD-10-CM

## 2018-04-25 NOTE — Telephone Encounter (Signed)
Ok to change? Last prescribed on 03/31/2018. Last office visit on 03/31/2018. No future appointment

## 2018-04-25 NOTE — Telephone Encounter (Signed)
No. She was supposed to follow up with me for depression/anxiety since I started her on this medication. She should have a 30 day refill. She needs to schedule a follow up visit with me for around February 14th.

## 2018-04-26 NOTE — Telephone Encounter (Signed)
Per DPR, left detail message of Whitney [name] comments for patient to call back to schedule follow up appointment

## 2018-05-21 ENCOUNTER — Other Ambulatory Visit: Payer: Self-pay | Admitting: Primary Care

## 2018-05-21 DIAGNOSIS — F411 Generalized anxiety disorder: Secondary | ICD-10-CM

## 2018-05-21 DIAGNOSIS — F331 Major depressive disorder, recurrent, moderate: Secondary | ICD-10-CM

## 2018-05-25 ENCOUNTER — Encounter: Payer: Self-pay | Admitting: Primary Care

## 2018-05-25 ENCOUNTER — Ambulatory Visit: Payer: 59 | Admitting: Primary Care

## 2018-05-25 DIAGNOSIS — F411 Generalized anxiety disorder: Secondary | ICD-10-CM

## 2018-05-25 DIAGNOSIS — F331 Major depressive disorder, recurrent, moderate: Secondary | ICD-10-CM | POA: Diagnosis not present

## 2018-05-25 MED ORDER — DESVENLAFAXINE SUCCINATE ER 50 MG PO TB24: 50.0000 mg | ORAL_TABLET | Freq: Every day | ORAL | 1 refills | Status: DC

## 2018-05-25 NOTE — Patient Instructions (Addendum)
Continue Pristiq 50 mg daily for anxiety and depression.  We will need to see you in July 2020 for your annual physical and diabetes check, please schedule.   It was a pleasure to see you today!

## 2018-05-25 NOTE — Assessment & Plan Note (Signed)
More energized, less fatigue on Pristiq 50mg , continue same. Denies SI/HI. Refills sent to pharmacy.

## 2018-05-25 NOTE — Progress Notes (Signed)
Subjective:    Patient ID: Whitney [name], female    DOB: 12-May-1960, 58 y.o.   MRN: [id number]  HPI  Whitney [name] is a 58 year old female who presents today for follow up of anxiety and depression.   She was last evaluated on 03/31/18 with reports of difficulty sleeping/concentrating/prioritizing, increased anxiety, fatigue, mind racing thoughts. She is under a lot of stress with personal and work life. GAD 7 score of 7 and PHQ 9 score of 20 mg so she was initiated on Pristiq 50 mg.   Since her last visit she's noticed a moderate amount of improvement. Positive effects include more concentration, organization. Less fatigue and mind racing thoughts. She does still notice some anxiety symptoms but they are overall more tolerable. She denies SI/HI. Has noticed some headaches intermittently.   Review of Systems  Gastrointestinal: Negative for abdominal pain and nausea.  Neurological:       Intermittent mild headaches  Psychiatric/Behavioral: Negative for suicidal ideas.       See HPI       Past Medical History:  Diagnosis Date  . Allergy    Seasonal  . Anxiety   . Attention deficit disorder without mention of hyperactivity   . Barrett's esophagus   . Congenital anomalies of foot, not elsewhere classified   . Depression   . Diabetes mellitus without complication ([place])   . Displacement of intervertebral disc, site unspecified, without myelopathy   . Diverticulosis   . Elevated blood pressure reading without diagnosis of hypertension   . Esophageal reflux   . Gallstones   . Hiatal hernia   . HLD (hyperlipidemia)   . Hypoglycemia, unspecified   . Internal hemorrhoids without mention of complication   . Other abnormal blood chemistry      Social History   Socioeconomic History  . Marital status: Married    Spouse name: Not on file  . Number of children: 2  . Years of education: Not on file  . Highest education level: Not on file  Occupational History  . Occupation: Self  employeed- Conservation officer, historic buildings  Social Needs  . Financial resource strain: Not on file  . Food insecurity:    Worry: Not on file    Inability: Not on file  . Transportation needs:    Medical: Not on file    Non-medical: Not on file  Tobacco Use  . Smoking status: Former Smoker    Types: Cigarettes  . Smokeless tobacco: Never Used  . Tobacco comment: 2 years as a teenager  Substance and Sexual Activity  . Alcohol use: Yes    Alcohol/week: 0.0 standard drinks    Comment: occasional wine  . Drug use: No  . Sexual activity: Yes  Lifestyle  . Physical activity:    Days per week: Not on file    Minutes per session: Not on file  . Stress: Not on file  Relationships  . Social connections:    Talks on phone: Not on file    Gets together: Not on file    Attends religious service: Not on file    Active member of club or organization: Not on file    Attends meetings of clubs or organizations: Not on file    Relationship status: Not on file  . Intimate partner violence:    Fear of current or ex partner: Not on file    Emotionally abused: Not on file    Physically abused: Not on file    Forced  sexual activity: Not on file  Other Topics Concern  . Not on file  Social History Narrative  . Not on file    Past Surgical History:  Procedure Laterality Date  . BREAST CYST EXCISION Left   . CARDIAC CATHETERIZATION     [facility]  . CESAREAN SECTION    . CHOLECYSTECTOMY  06/16/2007   Dr. Hulen Skains  . COLONOSCOPY  02/06/2002   diverticula and internal hemorroids  . HIATAL HERNIA REPAIR    . LUMBAR LAMINECTOMY     L5-S1    Family History  Problem Relation Age of Onset  . Colon polyps Mother   . Hypertension Father   . Diabetes Father   . Heart disease Father   . Colon polyps Maternal Grandmother   . Heart attack Maternal Grandmother   . Prostate cancer Maternal Grandfather   . Heart attack Paternal Grandfather   . Colon cancer Paternal Grandmother   . Diabetes Maternal Aunt      Allergies  Allergen Reactions  . Sertraline Hcl     REACTION: Headache/ vision changes  . Zoloft [Sertraline Hcl]     Same as Sertraline    Current Outpatient Medications on File Prior to Visit  Medication Sig Dispense Refill  . Blood Glucose Monitoring Suppl (FREESTYLE LITE) DEVI USE AS DIRECTED TO TEST BLOOD SUGAR  0  . cefdinir (OMNICEF) 300 MG capsule Take 1 capsule (300 mg total) by mouth 2 (two) times daily. 20 capsule 0  . Cyanocobalamin (B-12) 5000 MCG CAPS Take by mouth.    . cyclobenzaprine (FLEXERIL) 5 MG tablet TAKE 1 TABLET (5 MG TOTAL) BY MOUTH 3 (THREE) TIMES DAILY AS NEEDED FOR MUSCLE SPASMS. 30 tablet 1  . Esomeprazole Magnesium (NEXIUM 24HR PO) Take 2 capsules by mouth daily. Reported on 04/16/2015    . fluticasone (FLONASE) 50 MCG/ACT nasal spray Place 2 sprays into both nostrils daily. 16 g 6  . folic acid (FOLVITE) 546 MCG tablet Take 400 mcg by mouth daily.    Marland Kitchen glipiZIDE (GLUCOTROL) 10 MG tablet TAKE 1 TABLET BY MOUTH TWICE DAILY FOR DIABETES. 60 tablet 2  . glucose blood (FREESTYLE LITE) test strip Use as instructed to test blood sugar 3 times daily 100 each 2  . glucose monitoring kit (FREESTYLE) monitoring kit Use as instructed to test blood sugar daily 1 each 0  . Lancets (FREESTYLE) lancets Use as instructed to test blood sugar 3 times daily 100 each 2  . metFORMIN (GLUCOPHAGE) 1000 MG tablet Take 1 tablet (1,000 mg total) by mouth 2 (two) times daily with a meal. 14 tablet 0  . Multiple Minerals-Vitamins (CALCIUM-MAGNESIUM-ZINC-D3) TABS Take by mouth.    . Omega-3 Fatty Acids (FISH OIL) 1200 MG CPDR Take by mouth.    . ondansetron (ZOFRAN-ODT) 8 MG disintegrating tablet Take 1 tablet (8 mg total) by mouth every 8 (eight) hours as needed for nausea. 10 tablet 0  . rosuvastatin (CRESTOR) 5 MG tablet Take 1 tablet (5 mg total) by mouth daily. 7 tablet 0  . triamcinolone cream (KENALOG) 0.1 % APPLY 1 APPLICATION TOPICALLY 2 (TWO) TIMES DAILY. 30 g 1  . UNABLE  TO FIND Med Name: CBD Oil     No current facility-administered medications on file prior to visit.     BP 120/76   Pulse 95   Temp 98.1 F (36.7 C) (Oral)   Ht '5\' 7"'  (2.703 m)   Wt 205 lb 8 oz (93.2 kg)   LMP 06/17/2011   SpO2  95%   BMI 32.19 kg/m    Objective:   Physical Exam  Constitutional: She appears well-nourished.  Neck: Neck supple.  Cardiovascular: Normal rate and regular rhythm.  Respiratory: Effort normal and breath sounds normal.  Skin: Skin is warm and dry.  Psychiatric: She has a normal mood and affect.           Assessment & Plan:

## 2018-05-25 NOTE — Assessment & Plan Note (Signed)
Improved on Pristiq 50 mg, appears much more relaxed today. Continue current regimen. Denies SI/HI. Refills sent to pharmacy.

## 2018-06-06 DIAGNOSIS — F411 Generalized anxiety disorder: Secondary | ICD-10-CM

## 2018-06-06 DIAGNOSIS — F331 Major depressive disorder, recurrent, moderate: Secondary | ICD-10-CM

## 2018-06-07 MED ORDER — ESCITALOPRAM OXALATE 10 MG PO TABS: 10.0000 mg | ORAL_TABLET | Freq: Every day | ORAL | 1 refills | Status: DC

## 2018-06-15 ENCOUNTER — Other Ambulatory Visit: Payer: Self-pay | Admitting: Primary Care

## 2018-06-15 DIAGNOSIS — E785 Hyperlipidemia, unspecified: Secondary | ICD-10-CM

## 2018-06-15 DIAGNOSIS — E119 Type 2 diabetes mellitus without complications: Secondary | ICD-10-CM

## 2018-06-29 ENCOUNTER — Other Ambulatory Visit: Payer: Self-pay | Admitting: Primary Care

## 2018-06-29 DIAGNOSIS — F331 Major depressive disorder, recurrent, moderate: Secondary | ICD-10-CM

## 2018-06-29 DIAGNOSIS — F411 Generalized anxiety disorder: Secondary | ICD-10-CM

## 2018-06-30 NOTE — Telephone Encounter (Signed)
How's she doing on the Lexapro?

## 2018-06-30 NOTE — Telephone Encounter (Signed)
Patient stated that she could not tolerated the Lexapro. She slowly took herself off the medication. She is back on the CBD oil and is doing fine.

## 2018-06-30 NOTE — Telephone Encounter (Signed)
Ok to change? Last prescribed on 06/07/2018. Last office visit on 05/25/2018. No future appointment

## 2018-06-30 NOTE — Telephone Encounter (Signed)
Noted. Will remove from list.

## 2018-07-07 ENCOUNTER — Other Ambulatory Visit: Payer: Self-pay | Admitting: Primary Care

## 2018-07-07 DIAGNOSIS — E119 Type 2 diabetes mellitus without complications: Secondary | ICD-10-CM

## 2018-08-17 ENCOUNTER — Encounter (HOSPITAL_COMMUNITY): Payer: Self-pay | Admitting: Emergency Medicine

## 2018-08-17 ENCOUNTER — Emergency Department (HOSPITAL_COMMUNITY): Payer: 59

## 2018-08-17 ENCOUNTER — Other Ambulatory Visit: Payer: Self-pay

## 2018-08-17 ENCOUNTER — Ambulatory Visit: Payer: 59 | Admitting: Primary Care

## 2018-08-17 ENCOUNTER — Emergency Department (HOSPITAL_COMMUNITY)
Admission: EM | Admit: 2018-08-17 | Discharge: 2018-08-17 | Disposition: A | Discharge status: Home / Self Care | Payer: 59 | Attending: Emergency Medicine | Admitting: Emergency Medicine

## 2018-08-17 DIAGNOSIS — E119 Type 2 diabetes mellitus without complications: Secondary | ICD-10-CM | POA: Diagnosis not present

## 2018-08-17 DIAGNOSIS — R0789 Other chest pain: Secondary | ICD-10-CM | POA: Insufficient documentation

## 2018-08-17 DIAGNOSIS — R5383 Other fatigue: Secondary | ICD-10-CM | POA: Insufficient documentation

## 2018-08-17 DIAGNOSIS — Z1159 Encounter for screening for other viral diseases: Secondary | ICD-10-CM | POA: Diagnosis not present

## 2018-08-17 DIAGNOSIS — Z7984 Long term (current) use of oral hypoglycemic drugs: Secondary | ICD-10-CM | POA: Diagnosis not present

## 2018-08-17 DIAGNOSIS — R079 Chest pain, unspecified: Secondary | ICD-10-CM | POA: Diagnosis present

## 2018-08-17 DIAGNOSIS — Z79899 Other long term (current) drug therapy: Secondary | ICD-10-CM | POA: Diagnosis not present

## 2018-08-17 DIAGNOSIS — Z87891 Personal history of nicotine dependence: Secondary | ICD-10-CM | POA: Insufficient documentation

## 2018-08-17 LAB — BASIC METABOLIC PANEL
Anion gap: 11 (ref 5–15)
BUN: 17 mg/dL (ref 6–20)
CO2: 22 mmol/L (ref 22–32)
Calcium: 9.4 mg/dL (ref 8.9–10.3)
Chloride: 104 mmol/L (ref 98–111)
Creatinine, Ser: 0.71 mg/dL (ref 0.44–1.00)
GFR calc Af Amer: >60 mL/min (ref >60)
GFR calc non Af Amer: >60 mL/min (ref >60)
Glucose, Bld: 214 mg/dL — ABNORMAL HIGH (ref 70–99)
Potassium: 4.5 mmol/L (ref 3.5–5.1)
Sodium: 137 mmol/L (ref 135–145)

## 2018-08-17 LAB — SARS CORONAVIRUS 2 BY RT PCR (HOSPITAL ORDER, PERFORMED IN ~~LOC~~ HOSPITAL LAB): SARS Coronavirus 2: NEGATIVE

## 2018-08-17 LAB — CBC WITH DIFFERENTIAL/PLATELET
Abs Immature Granulocytes: 0.03 10*3/uL (ref 0.00–0.07)
Basophils Absolute: 0.1 10*3/uL (ref 0.0–0.1)
Basophils Relative: 1 %
Eosinophils Absolute: 0.2 10*3/uL (ref 0.0–0.5)
Eosinophils Relative: 2 %
HCT: 42.7 % (ref 36.0–46.0)
Hemoglobin: 13.5 g/dL (ref 12.0–15.0)
Immature Granulocytes: 0 %
Lymphocytes Relative: 20 %
Lymphs Abs: 2.2 10*3/uL (ref 0.7–4.0)
MCH: 27.8 pg (ref 26.0–34.0)
MCHC: 31.6 g/dL (ref 30.0–36.0)
MCV: 87.9 fL (ref 80.0–100.0)
Monocytes Absolute: 0.9 10*3/uL (ref 0.1–1.0)
Monocytes Relative: 8 %
Neutro Abs: 7.6 10*3/uL (ref 1.7–7.7)
Neutrophils Relative %: 69 %
Platelets: 408 10*3/uL — ABNORMAL HIGH (ref 150–400)
RBC: 4.86 MIL/uL (ref 3.87–5.11)
RDW: 13.2 % (ref 11.5–15.5)
WBC: 10.9 10*3/uL — ABNORMAL HIGH (ref 4.0–10.5)
nRBC: 0 % (ref 0.0–0.2)

## 2018-08-17 LAB — I-STAT TROPONIN, ED: Troponin i, poc: 0.01 ng/mL (ref 0.00–0.08)

## 2018-08-17 NOTE — ED Triage Notes (Signed)
Patient arrives POV c/o chest pain, shortness of breath, dizziness and confusion x 3 weeks. Reports dyspnea with exertion. Describes chest pain as pressure across top of chest, radiating into back.

## 2018-08-17 NOTE — Telephone Encounter (Signed)
Noted. Patient has checked into the ED at [facility].

## 2018-08-17 NOTE — Telephone Encounter (Signed)
Upon exertion pt having SOB and tightness in chest; pt not having any CP right now but pt is dizzy. Pt also for 1 wk having non prod cough, S/T, H/A everyday,and loss of taste and smell. No travel and no known exposure to covid or flu. Due to covid symptoms pt will need to go to ED for eval. Pt will go to [facility] ED and Raquel Sarna notified at [facility] ED. Pt voiced understanding and will have someone drive her to [facility] ED due to dizziness. Arrival time 30'. FYI to Gentry Fitz NP.

## 2018-08-17 NOTE — ED Provider Notes (Signed)
[name] Provider Note   CSN: [id number] Arrival date & time: 08/17/18  5631    History   Chief Complaint Chief Complaint  Patient presents with  . Chest Pain  . Shortness of Breath    HPI [name] is a 58 y.o. female.     58 year old female with prior medical history as detailed below presents for evaluation of chest pain, cough, dizziness, malaise, and fatigue.  Patient was sent to the ED at the recommendation of her primary care provider.  She reports roughly 1 week of symptoms.  She denies current active chest pain.  She reports intermittent episodes of vague chest discomfort.  She does report mild associated intermittent cough.  She also reports intermittent dizziness with associated malaise and fatigue.  She denies fever.  She denies productive cough.  She denies recent travel history.  She denies known exposure to COVID positive patient.  She appears to be in no acute distress at the time of my evaluation.  The history is provided by the patient and medical records.  Chest Pain  Pain location:  Unable to specify Pain quality: dull   Pain radiates to:  Does not radiate Pain severity:  No pain Onset quality:  Gradual Duration:  1 week Progression:  Waxing and waning Chronicity:  New Relieved by:  Nothing Worsened by:  Nothing Associated symptoms: shortness of breath   Shortness of Breath  Associated symptoms: chest pain     Past Medical History:  Diagnosis Date  . Allergy    Seasonal  . Anxiety   . Attention deficit disorder without mention of hyperactivity   . Barrett's esophagus   . Congenital anomalies of foot, not elsewhere classified   . Depression   . Diabetes mellitus without complication ([place])   . Displacement of intervertebral disc, site unspecified, without myelopathy   . Diverticulosis   . Elevated blood pressure reading without diagnosis of hypertension   . Esophageal reflux   . Gallstones   .  Hiatal hernia   . HLD (hyperlipidemia)   . Hypoglycemia, unspecified   . Internal hemorrhoids without mention of complication   . Other abnormal blood chemistry     Patient Active Problem List   Diagnosis Date Noted  . Gastroenteritis 07/27/2016  . Frequent headaches 10/14/2015  . Anemia, iron deficiency 12/30/2014  . Type 2 diabetes mellitus ([place]) 10/15/2014  . Microcytic anemia 10/15/2014  . OBESITY 07/08/2009  . Anemia due to other cause 02/28/2009  . BARRETTS ESOPHAGUS 10/16/2008  . HIATAL HERNIA 10/16/2008  . HLD (hyperlipidemia) 10/09/2008  . CONSTIPATION 10/09/2008  . HERNIATED DISC 08/07/2008  . 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 ADD 07/24/2007  . CHOLELITHIASIS 05/26/2007  . ELEVATED BLOOD PRESSURE WITHOUT DIAGNOSIS OF HYPERTENSION 03/10/2007  . G E R D 09/27/2006  . MDD (major depressive disorder) 08/31/2006  . GAD (generalized anxiety disorder) 08/17/2006  . HEMORRHOIDS, INTERNAL 02/06/2002  . DIVERTICULAR DISEASE 02/06/2002    Past Surgical History:  Procedure Laterality Date  . BREAST CYST EXCISION Left   . CARDIAC CATHETERIZATION     [facility]  . CESAREAN SECTION    . CHOLECYSTECTOMY  06/16/2007   Dr. Hulen Skains  . COLONOSCOPY  02/06/2002   diverticula and internal hemorroids  . HIATAL HERNIA REPAIR    . LUMBAR LAMINECTOMY     L5-S1     OB History   No obstetric history on file.      Home Medications  Prior to Admission medications   Medication Sig Start Date End Date Taking? Authorizing Provider  Blood Glucose Monitoring Suppl (FREESTYLE LITE) DEVI USE AS DIRECTED TO TEST BLOOD SUGAR 10/15/14   [provider]  cefdinir (OMNICEF) 300 MG capsule Take 1 capsule (300 mg total) by mouth 2 (two) times daily. 01/31/18   Jearld Fenton, NP  Cyanocobalamin (B-12) 5000 MCG CAPS Take by mouth.    [provider]  cyclobenzaprine (FLEXERIL) 5 MG tablet TAKE 1 TABLET (5 MG TOTAL) BY MOUTH 3 (THREE) TIMES DAILY AS NEEDED FOR MUSCLE  SPASMS. 10/27/15   Lucille Passy, MD  Esomeprazole Magnesium (NEXIUM 24HR PO) Take 2 capsules by mouth daily. Reported on 04/16/2015    [provider]  fluticasone (FLONASE) 50 MCG/ACT nasal spray Place 2 sprays into both nostrils daily. 11/07/17   Elby Beck, FNP  folic acid (FOLVITE) 407 MCG tablet Take 400 mcg by mouth daily.    [provider]  glipiZIDE (GLUCOTROL) 10 MG tablet TAKE 1 TABLET BY MOUTH TWICE DAILY FOR DIABETES. 07/10/18   [name], NP  glucose blood (FREESTYLE LITE) test strip Use as instructed to test blood sugar 3 times daily 09/09/17   [name], NP  glucose monitoring kit (FREESTYLE) monitoring kit Use as instructed to test blood sugar daily 09/09/17   [name], NP  Lancets (FREESTYLE) lancets Use as instructed to test blood sugar 3 times daily 09/09/17   [name], NP  metFORMIN (GLUCOPHAGE) 1000 MG tablet TAKE 1 TABLET BY MOUTH TWICE DAILY WITH A MEAL FOR DIABETES. 06/15/18   [name], NP  Multiple Minerals-Vitamins (CALCIUM-MAGNESIUM-ZINC-D3) TABS Take by mouth.    [provider]  Omega-3 Fatty Acids (FISH OIL) 1200 MG CPDR Take by mouth.    [provider]  ondansetron (ZOFRAN-ODT) 8 MG disintegrating tablet Take 1 tablet (8 mg total) by mouth every 8 (eight) hours as needed for nausea. 07/26/16   Elby Beck, FNP  rosuvastatin (CRESTOR) 5 MG tablet TAKE 1 TABLET BY MOUTH EVERY DAY IN THE EVENING FOR CHOLESTEROL 06/15/18   [name], NP  triamcinolone cream (KENALOG) 0.1 % APPLY 1 APPLICATION TOPICALLY 2 (TWO) TIMES DAILY. 04/26/17   Lucille Passy, MD  UNABLE TO FIND Med Name: CBD Oil    [provider]    Family History Family History  Problem Relation Age of Onset  . Colon polyps Mother   . Hypertension Father   . Diabetes Father   . Heart disease Father   . Colon polyps Maternal Grandmother   . Heart attack Maternal Grandmother   . Prostate cancer  Maternal Grandfather   . Heart attack Paternal Grandfather   . Colon cancer Paternal Grandmother   . Diabetes Maternal Aunt     Social History Social History   Tobacco Use  . Smoking status: Former Smoker    Types: Cigarettes  . Smokeless tobacco: Never Used  . Tobacco comment: 2 years as a teenager  Substance Use Topics  . Alcohol use: Yes    Alcohol/week: 0.0 standard drinks    Comment: occasional wine  . Drug use: No     Allergies   Sertraline hcl and Zoloft [sertraline hcl]   Review of Systems Review of Systems  Respiratory: Positive for shortness of breath.   Cardiovascular: Positive for chest pain.  All other systems reviewed and are negative.    Physical Exam Updated Vital Signs LMP 06/17/2011  Physical Exam Vitals signs and nursing note reviewed.  Constitutional:      General: She is not in acute distress.    Appearance: She is well-developed.  HENT:     Head: Normocephalic and atraumatic.  Eyes:     Conjunctiva/sclera: Conjunctivae normal.     Pupils: Pupils are equal, round, and reactive to light.  Neck:     Musculoskeletal: Normal range of motion and neck supple.  Cardiovascular:     Rate and Rhythm: Normal rate and regular rhythm.     Heart sounds: Normal heart sounds.  Pulmonary:     Effort: Pulmonary effort is normal. No respiratory distress.     Breath sounds: Normal breath sounds.  Abdominal:     General: There is no distension.     Palpations: Abdomen is soft.     Tenderness: There is no abdominal tenderness.  Musculoskeletal: Normal range of motion.        General: No deformity.  Skin:    General: Skin is warm and dry.  Neurological:     Mental Status: She is alert and oriented to person, place, and time.      ED Treatments / Results  Labs (all labs ordered are listed, but only abnormal results are displayed) Labs Reviewed  BASIC METABOLIC PANEL - Abnormal; Notable for the following components:      Result Value   Glucose,  Bld 214 (*)    All other components within normal limits  CBC WITH DIFFERENTIAL/PLATELET - Abnormal; Notable for the following components:   WBC 10.9 (*)    Platelets 408 (*)    All other components within normal limits  SARS CORONAVIRUS 2 (HOSPITAL ORDER, [facility])  I-STAT TROPONIN, ED    EKG None  Radiology No results found.  Procedures Procedures (including critical care time)  Medications Ordered in ED Medications - No data to display   Initial Impression / Assessment and Plan / ED Course  I have reviewed the triage vital signs and the nursing notes.  Pertinent labs & imaging results that were available during my care of the patient were reviewed by me and considered in my medical decision making (see chart for details).        MDM  Screen complete  [name] was evaluated in Emergency Department on 08/17/2018 for the symptoms described in the history of present illness. She was evaluated in the context of the global COVID-19 pandemic, which necessitated consideration that the patient might be at risk for infection with the SARS-CoV-2 virus that causes COVID-19. Institutional protocols and algorithms that pertain to the evaluation of patients at risk for COVID-19 are in a state of rapid change based on information released by regulatory bodies including the CDC and federal and state organizations. These policies and algorithms were followed during the patient's care in the ED.  Patient is presenting for evaluation of multiple complaints including fairly atypical chest discomfort.  Patient's chest discomfort has been ongoing for at least the last 2 to 3 weeks.  EKG today is without evidence of acute ischemia.  Troponin x1 is negative. Heart score is 2.   Other screening labs are without significant abnormality.  Patient's COVID screen is negative.  Patient does feel improved following her initial work-up.  She now desires discharge home.   She was offered a delta troponin.  She declined to wait for same.  Patient does understand the need for close follow-up.  Strict return precautions given  and understood.  Importance of continued social distancing was stressed.  Final Clinical Impressions(s) / ED Diagnoses   Final diagnoses:  Atypical chest pain  Other fatigue    ED Discharge Orders    None       Valarie Merino, MD 08/23/18 [phone]

## 2018-08-17 NOTE — Discharge Instructions (Signed)
Please return for any problem.  Follow-up with your regular care provider as instructed. °

## 2018-09-07 ENCOUNTER — Telehealth: Payer: Self-pay

## 2018-09-07 NOTE — Telephone Encounter (Signed)
Copied from [place] [id number]. Topic: Appointment Scheduling - Scheduling Inquiry for Clinic >> Sep 06, 2018  4:12 PM Celene Kras A wrote: Reason for CRM: Pt caleld to set up VV. Please advise.

## 2018-09-07 NOTE — Telephone Encounter (Signed)
Pt scheduled for 09/08/18 @ 2pm

## 2018-09-08 ENCOUNTER — Other Ambulatory Visit (INDEPENDENT_AMBULATORY_CARE_PROVIDER_SITE_OTHER): Payer: 59

## 2018-09-08 ENCOUNTER — Telehealth (INDEPENDENT_AMBULATORY_CARE_PROVIDER_SITE_OTHER): Payer: 59 | Admitting: Primary Care

## 2018-09-08 ENCOUNTER — Encounter: Payer: Self-pay | Admitting: Primary Care

## 2018-09-08 VITALS — Wt 205.0 lb

## 2018-09-08 DIAGNOSIS — R1012 Left upper quadrant pain: Secondary | ICD-10-CM

## 2018-09-08 NOTE — Progress Notes (Signed)
Subjective:    Patient ID: [name], female    DOB: [birth year], 58 y.o.   MRN: [id number]  HPI  Virtual Visit via Video Note  I connected with [name] on 09/08/18 at  2:00 PM EDT by a video enabled telemedicine application and verified that I am speaking with the correct person using two identifiers.  Location: Patient: Home Provider: Office   I discussed the limitations of evaluation and management by telemedicine and the availability of in person appointments. The patient expressed understanding and agreed to proceed.  History of Present Illness:  [name] is a 57 year old female with a history of type 2 diabetes, GERD, hiatal hernia, diverticula, anemia, barretts esophagus who presents today with a chief complaint of abdominal pain.   Her pain is located to the LUQ abdomen for which she describes it as burning. Her discomfort is constant, worse on an empty stomach. She will sometimes experience these symptoms after eating. Also with symptoms of nausea, decreased appetite. She denies constipation, diarrhea, bloody stools. She has not discomfort with drinking liquids. History of hiatal hernia repair in 2010.  This began about 1-2 weeks ago. She's compliant to Nexium HS and gaviscon BID.   Her glucose readings are ranging 170's on average.   Observations/Objective:  Alert and oriented. Appears well, not sickly. No distress. Speaking in complete sentences.   Assessment and Plan:  LUQ abdominal pain for 1-2 weeks. Also with fatigue, decrease in appetite. Differentials include H pylori infection, PUD, hiatal hernia, GERD. Check labs including CBC, H pylori stool, CMP, lipase. Continue Nexium and Gaviscon for now. Offered anti emetic and she kindly declined. She appears stable for outpatient treatment. ED precautions provided.  Follow Up Instructions:  Call the main line to have your lab scheduled.  Return the stool specimen as soon as possible.  Go to the  hospital if you experience uncontrollable vomiting, severe pain, start feeling weak/faint.  It was a pleasure to see you today!    I discussed the assessment and treatment plan with the patient. The patient was provided an opportunity to ask questions and all were answered. The patient agreed with the plan and demonstrated an understanding of the instructions.   The patient was advised to call back or seek an in-person evaluation if the symptoms worsen or if the condition fails to improve as anticipated.     [name], NP    Review of Systems  Constitutional: Positive for fatigue. Negative for fever.  Respiratory: Negative for shortness of breath.   Gastrointestinal: Positive for abdominal pain and nausea. Negative for constipation, diarrhea and vomiting.  Neurological: Negative for dizziness.       Past Medical History:  Diagnosis Date  . Allergy    Seasonal  . Anxiety   . Attention deficit disorder without mention of hyperactivity   . Barrett's esophagus   . Congenital anomalies of foot, not elsewhere classified   . Depression   . Diabetes mellitus without complication ([place])   . Displacement of intervertebral disc, site unspecified, without myelopathy   . Diverticulosis   . Elevated blood pressure reading without diagnosis of hypertension   . Esophageal reflux   . Gallstones   . Hiatal hernia   . HLD (hyperlipidemia)   . Hypoglycemia, unspecified   . Internal hemorrhoids without mention of complication   . Other abnormal blood chemistry      Social History   Socioeconomic History  . Marital status: Married  Spouse name: Not on file  . Number of children: 2  . Years of education: Not on file  . Highest education level: Not on file  Occupational History  . Occupation: Self employeed- Conservation officer, historic buildings  Social Needs  . Financial resource strain: Not on file  . Food insecurity    Worry: Not on file    Inability: Not on file  . Transportation needs     Medical: Not on file    Non-medical: Not on file  Tobacco Use  . Smoking status: Former Smoker    Types: Cigarettes  . Smokeless tobacco: Never Used  . Tobacco comment: 2 years as a teenager  Substance and Sexual Activity  . Alcohol use: Yes    Alcohol/week: 0.0 standard drinks    Comment: occasional wine  . Drug use: No  . Sexual activity: Yes  Lifestyle  . Physical activity    Days per week: Not on file    Minutes per session: Not on file  . Stress: Not on file  Relationships  . Social Herbalist on phone: Not on file    Gets together: Not on file    Attends religious service: Not on file    Active member of club or organization: Not on file    Attends meetings of clubs or organizations: Not on file    Relationship status: Not on file  . Intimate partner violence    Fear of current or ex partner: Not on file    Emotionally abused: Not on file    Physically abused: Not on file    Forced sexual activity: Not on file  Other Topics Concern  . Not on file  Social History Narrative  . Not on file    Past Surgical History:  Procedure Laterality Date  . BREAST CYST EXCISION Left   . CARDIAC CATHETERIZATION     [facility]  . CESAREAN SECTION    . CHOLECYSTECTOMY  06/16/2007   Dr. Hulen Skains  . COLONOSCOPY  02/06/2002   diverticula and internal hemorroids  . HIATAL HERNIA REPAIR    . LUMBAR LAMINECTOMY     L5-S1    Family History  Problem Relation Age of Onset  . Colon polyps Mother   . Hypertension Father   . Diabetes Father   . Heart disease Father   . Colon polyps Maternal Grandmother   . Heart attack Maternal Grandmother   . Heart attack Paternal Grandfather   . Diabetes Maternal Aunt   . Prostate cancer Maternal Grandfather   . Colon cancer Paternal Grandmother     Allergies  Allergen Reactions  . Sertraline Hcl     REACTION: Headache/ vision changes  . Zoloft [Sertraline Hcl]     Same as Sertraline    Current Outpatient Medications on File Prior  to Visit  Medication Sig Dispense Refill  . Aspirin-Salicylamide-Caffeine (BC HEADACHE POWDER PO) Take 1 packet by mouth as needed (headache).    . Blood Glucose Monitoring Suppl (FREESTYLE LITE) DEVI USE AS DIRECTED TO TEST BLOOD SUGAR  0  . cyclobenzaprine (FLEXERIL) 5 MG tablet TAKE 1 TABLET (5 MG TOTAL) BY MOUTH 3 (THREE) TIMES DAILY AS NEEDED FOR MUSCLE SPASMS. 30 tablet 1  . Esomeprazole Magnesium (NEXIUM 24HR) 20 MG TBEC Take 40 capsules by mouth at bedtime.     . fluticasone (FLONASE) 50 MCG/ACT nasal spray Place 2 sprays into both nostrils daily. (Patient taking differently: Place 2 sprays into both nostrils as needed for allergies. )  16 g 6  . glipiZIDE (GLUCOTROL) 10 MG tablet TAKE 1 TABLET BY MOUTH TWICE DAILY FOR DIABETES. (Patient taking differently: Take 10 mg by mouth 2 (two) times daily before a meal. ) 180 tablet 0  . glucose blood (FREESTYLE LITE) test strip Use as instructed to test blood sugar 3 times daily 100 each 2  . glucose monitoring kit (FREESTYLE) monitoring kit Use as instructed to test blood sugar daily 1 each 0  . Lancets (FREESTYLE) lancets Use as instructed to test blood sugar 3 times daily 100 each 2  . metFORMIN (GLUCOPHAGE) 1000 MG tablet TAKE 1 TABLET BY MOUTH TWICE DAILY WITH A MEAL FOR DIABETES. (Patient taking differently: Take 1,000 mg by mouth 2 (two) times daily with a meal. ) 180 tablet 3  . naproxen sodium (ALEVE) 220 MG tablet Take 440 mg by mouth daily as needed (pain).    . Omega-3 Fatty Acids (FISH OIL) 1200 MG CPDR Take 1,200 mg by mouth daily.     . ondansetron (ZOFRAN-ODT) 8 MG disintegrating tablet Take 1 tablet (8 mg total) by mouth every 8 (eight) hours as needed for nausea. 10 tablet 0  . rosuvastatin (CRESTOR) 5 MG tablet TAKE 1 TABLET BY MOUTH EVERY DAY IN THE EVENING FOR CHOLESTEROL (Patient taking differently: Take 5 mg by mouth daily at 6 PM. ) 90 tablet 3  . triamcinolone cream (KENALOG) 0.1 % APPLY 1 APPLICATION TOPICALLY 2 (TWO) TIMES  DAILY. 30 g 1   No current facility-administered medications on file prior to visit.     Wt 205 lb (93 kg)   LMP 06/17/2011   BMI 31.17 kg/m    Objective:   Physical Exam  Constitutional: She is oriented to person, place, and time. She appears well-nourished. She does not have a sickly appearance. She does not appear ill.  Respiratory: Effort normal.  Neurological: She is alert and oriented to person, place, and time.  Psychiatric: She has a normal mood and affect.           Assessment & Plan:

## 2018-09-08 NOTE — Addendum Note (Signed)
Addended by: Ellamae Sia on: 09/08/2018 02:56 PM   Modules accepted: Orders

## 2018-09-08 NOTE — Assessment & Plan Note (Signed)
LUQ abdominal pain for 1-2 weeks. Also with fatigue, decrease in appetite. Differentials include H pylori infection, PUD, hiatal hernia, GERD. Check labs including CBC, H pylori stool, CMP, lipase. Continue Nexium and Gaviscon for now. Offered anti emetic and she kindly declined. She appears stable for outpatient treatment. ED precautions provided.

## 2018-09-08 NOTE — Patient Instructions (Signed)
  Call the main line to have your lab scheduled.  Return the stool specimen as soon as possible.  Go to the hospital if you experience uncontrollable vomiting, severe pain, start feeling weak/faint.  It was a pleasure to see you today!

## 2018-09-09 LAB — CBC WITH DIFFERENTIAL/PLATELET
Absolute Monocytes: 737 cells/uL (ref 200–950)
Basophils Absolute: 88 cells/uL (ref 0–200)
Basophils Relative: 0.8 %
Eosinophils Absolute: 220 cells/uL (ref 15–500)
Eosinophils Relative: 2 %
HCT: 40 % (ref 35.0–45.0)
Hemoglobin: 12.8 g/dL (ref 11.7–15.5)
Lymphs Abs: 2948 cells/uL (ref 850–3900)
MCH: 27.5 pg (ref 27.0–33.0)
MCHC: 32 g/dL (ref 32.0–36.0)
MCV: 86 fL (ref 80.0–100.0)
MPV: 9.7 fL (ref 7.5–12.5)
Monocytes Relative: 6.7 %
Neutro Abs: 7007 cells/uL (ref 1500–7800)
Neutrophils Relative %: 63.7 %
Platelets: 417 10*3/uL — ABNORMAL HIGH (ref 140–400)
RBC: 4.65 10*6/uL (ref 3.80–5.10)
RDW: 13.3 % (ref 11.0–15.0)
Total Lymphocyte: 26.8 %
WBC: 11 10*3/uL — ABNORMAL HIGH (ref 3.8–10.8)

## 2018-09-09 LAB — COMPREHENSIVE METABOLIC PANEL
AG Ratio: 1.7 (calc) (ref 1.0–2.5)
ALT: 53 U/L — ABNORMAL HIGH (ref 6–29)
AST: 39 U/L — ABNORMAL HIGH (ref 10–35)
Albumin: 4 g/dL (ref 3.6–5.1)
Alkaline phosphatase (APISO): 75 U/L (ref 37–153)
BUN: 23 mg/dL (ref 7–25)
CO2: 24 mmol/L (ref 20–32)
Calcium: 9.7 mg/dL (ref 8.6–10.4)
Chloride: 104 mmol/L (ref 98–110)
Creat: 0.68 mg/dL (ref 0.50–1.05)
Globulin: 2.4 g/dL (calc) (ref 1.9–3.7)
Glucose, Bld: 82 mg/dL (ref 65–99)
Potassium: 4.2 mmol/L (ref 3.5–5.3)
Sodium: 139 mmol/L (ref 135–146)
Total Bilirubin: 0.2 mg/dL (ref 0.2–1.2)
Total Protein: 6.4 g/dL (ref 6.1–8.1)

## 2018-09-09 LAB — LIPASE: Lipase: 43 U/L (ref 7–60)

## 2018-09-12 LAB — HELICOBACTER PYLORI  SPECIAL ANTIGEN
MICRO NUMBER:: 564502
SPECIMEN QUALITY: ADEQUATE

## 2018-09-19 ENCOUNTER — Encounter: Payer: Self-pay | Admitting: Primary Care

## 2018-09-19 ENCOUNTER — Other Ambulatory Visit: Payer: Self-pay

## 2018-09-19 ENCOUNTER — Ambulatory Visit: Payer: 59 | Admitting: Primary Care

## 2018-09-19 VITALS — BP 120/78 | HR 68 | Temp 98.2°F | Ht 67.0 in | Wt 204.8 lb

## 2018-09-19 DIAGNOSIS — R5383 Other fatigue: Secondary | ICD-10-CM

## 2018-09-19 DIAGNOSIS — E785 Hyperlipidemia, unspecified: Secondary | ICD-10-CM

## 2018-09-19 DIAGNOSIS — E119 Type 2 diabetes mellitus without complications: Secondary | ICD-10-CM

## 2018-09-19 DIAGNOSIS — F411 Generalized anxiety disorder: Secondary | ICD-10-CM

## 2018-09-19 DIAGNOSIS — F331 Major depressive disorder, recurrent, moderate: Secondary | ICD-10-CM | POA: Diagnosis not present

## 2018-09-19 DIAGNOSIS — D509 Iron deficiency anemia, unspecified: Secondary | ICD-10-CM

## 2018-09-19 LAB — COMPREHENSIVE METABOLIC PANEL
ALT: 49 U/L — ABNORMAL HIGH (ref 0–35)
AST: 35 U/L (ref 0–37)
Albumin: 4.2 g/dL (ref 3.5–5.2)
Alkaline Phosphatase: 82 U/L (ref 39–117)
BUN: 15 mg/dL (ref 6–23)
CO2: 28 mEq/L (ref 19–32)
Calcium: 9.6 mg/dL (ref 8.4–10.5)
Chloride: 101 mEq/L (ref 96–112)
Creatinine, Ser: 0.72 mg/dL (ref 0.40–1.20)
GFR: 82.75 mL/min (ref >60.00)
Glucose, Bld: 145 mg/dL — ABNORMAL HIGH (ref 70–99)
Potassium: 4.5 mEq/L (ref 3.5–5.1)
Sodium: 137 mEq/L (ref 135–145)
Total Bilirubin: 0.3 mg/dL (ref 0.2–1.2)
Total Protein: 6.3 g/dL (ref 6.0–8.3)

## 2018-09-19 LAB — LIPID PANEL
Cholesterol: 146 mg/dL (ref 0–200)
HDL: 48.6 mg/dL (ref >39.00)
LDL Cholesterol: 58 mg/dL (ref 0–99)
NonHDL: 97.18
Total CHOL/HDL Ratio: 3
Triglycerides: 198 mg/dL — ABNORMAL HIGH (ref 0.0–149.0)
VLDL: 39.6 mg/dL (ref 0.0–40.0)

## 2018-09-19 LAB — VITAMIN D 25 HYDROXY (VIT D DEFICIENCY, FRACTURES): VITD: 30.42 ng/mL (ref 30.00–100.00)

## 2018-09-19 LAB — VITAMIN B12: Vitamin B-12: 442 pg/mL (ref 211–911)

## 2018-09-19 LAB — HEMOGLOBIN A1C: Hgb A1c MFr Bld: 8.8 % — ABNORMAL HIGH (ref 4.6–6.5)

## 2018-09-19 LAB — TSH: TSH: 1.31 u[IU]/mL (ref 0.35–4.50)

## 2018-09-19 MED ORDER — BUPROPION HCL ER (SR) 100 MG PO TB12: 100.0000 mg | ORAL_TABLET | Freq: Two times a day (BID) | ORAL | 0 refills | Status: DC

## 2018-09-19 NOTE — Patient Instructions (Signed)
Stop by the lab prior to leaving today. I will notify you of your results once received.   Start bupropion SR 100 mg tablets for anxiety, depression, focus. Start by taking 1 tablet once daily for one week, then increase to 1 tablet twice daily thereafter.  It is important that you improve your diet. Please limit carbohydrates in the form of white bread, rice, pasta, sweets, fast food, fried food, sugary drinks, etc. Increase your consumption of fresh fruits and vegetables, whole grains, lean protein.  Ensure you are consuming 64 ounces of water daily.  Continue exercising. You should be getting 150 minutes of moderate intensity exercise weekly.  Schedule a follow up visit for 6 weeks for re-evaluation.  It was a pleasure to see you today!

## 2018-09-19 NOTE — Assessment & Plan Note (Signed)
Repeat CBC pending. 

## 2018-09-19 NOTE — Progress Notes (Signed)
Subjective:    Patient ID: Whitney [name], female    DOB: 1961/01/14, 58 y.o.   MRN: [id number]  HPI  Whitney [name] is a 58 year old female with a history of MDD, GAD, GERD, Type 2 diabetes, anemia who presents today with a chief complaint of fatigue.  Symptoms include feeling sleepy, feeling tired, lack of focus, cannot multi-task, feeling "brain fog", decrease in appetite, feeling nauseated, intermittent dizziness, daily headaches. Symptoms have been present for months.  She's been exercising regularly several days weekly for the last one month. She is doing a balance of cardio and resistance training. This hasn't helped with fatigue.   She is checking her blood glucose levels 2-3 times daily and is getting readings of:  AM fasting: 200-270 2 hours after breakfast: mid 200's 2 hour after lunch: 160-180  She is complaint to her Glipizide 10 mg BID and metformin 1000 mg BID.   She endorses a "head injury" two months ago. She was squatted down while panting, lost her balance, and fell backward, hit the left occipital lobe on the corner of the wall. Since then she hasn't "felt right". She did not seek treatment for her injury.   She continues to experience chronic anxiety, feels very stressed. She has a full time business and is also watching her grandchildren. She has tried numerous medications in the past for anxiety/depression including Lexapro, Zoloft, Effexor, Pristiq and either had side effects or couldn't tolerate. She is open to trying something else. Has never seen psychiatry.   BP Readings from Last 3 Encounters:  09/19/18 120/78  08/17/18 124/84  05/25/18 120/76   She denies chest pain, abdominal pain, unexplained weight loss, rectal bleeding.   Review of Systems  Constitutional: Positive for appetite change and fatigue. Negative for chills and fever.  HENT: Negative for congestion.   Respiratory: Negative for shortness of breath.   Cardiovascular: Negative for chest pain.   Gastrointestinal: Negative for abdominal pain and blood in stool.  Neurological: Positive for dizziness and headaches.  Psychiatric/Behavioral: The patient is nervous/anxious.        See HPI       Past Medical History:  Diagnosis Date  . Allergy    Seasonal  . Anxiety   . Attention deficit disorder without mention of hyperactivity   . Barrett's esophagus   . Congenital anomalies of foot, not elsewhere classified   . Depression   . Diabetes mellitus without complication ([place])   . Displacement of intervertebral disc, site unspecified, without myelopathy   . Diverticulosis   . Elevated blood pressure reading without diagnosis of hypertension   . Esophageal reflux   . Gallstones   . Hiatal hernia   . HLD (hyperlipidemia)   . Hypoglycemia, unspecified   . Internal hemorrhoids without mention of complication   . Other abnormal blood chemistry      Social History   Socioeconomic History  . Marital status: Married    Spouse name: Not on file  . Number of children: 2  . Years of education: Not on file  . Highest education level: Not on file  Occupational History  . Occupation: Self employeed- Conservation officer, historic buildings  Social Needs  . Financial resource strain: Not on file  . Food insecurity    Worry: Not on file    Inability: Not on file  . Transportation needs    Medical: Not on file    Non-medical: Not on file  Tobacco Use  . Smoking status: Former Smoker  Types: Cigarettes  . Smokeless tobacco: Never Used  . Tobacco comment: 2 years as a teenager  Substance and Sexual Activity  . Alcohol use: Yes    Alcohol/week: 0.0 standard drinks    Comment: occasional wine  . Drug use: No  . Sexual activity: Yes  Lifestyle  . Physical activity    Days per week: Not on file    Minutes per session: Not on file  . Stress: Not on file  Relationships  . Social Herbalist on phone: Not on file    Gets together: Not on file    Attends religious service: Not on file     Active member of club or organization: Not on file    Attends meetings of clubs or organizations: Not on file    Relationship status: Not on file  . Intimate partner violence    Fear of current or ex partner: Not on file    Emotionally abused: Not on file    Physically abused: Not on file    Forced sexual activity: Not on file  Other Topics Concern  . Not on file  Social History Narrative  . Not on file    Past Surgical History:  Procedure Laterality Date  . BREAST CYST EXCISION Left   . CARDIAC CATHETERIZATION     [facility]  . CESAREAN SECTION    . CHOLECYSTECTOMY  06/16/2007   Dr. Hulen Skains  . COLONOSCOPY  02/06/2002   diverticula and internal hemorroids  . HIATAL HERNIA REPAIR    . LUMBAR LAMINECTOMY     L5-S1    Family History  Problem Relation Age of Onset  . Colon polyps Mother   . Hypertension Father   . Diabetes Father   . Heart disease Father   . Colon polyps Maternal Grandmother   . Heart attack Maternal Grandmother   . Heart attack Paternal Grandfather   . Diabetes Maternal Aunt   . Prostate cancer Maternal Grandfather   . Colon cancer Paternal Grandmother     Allergies  Allergen Reactions  . Sertraline Hcl     REACTION: Headache/ vision changes  . Zoloft [Sertraline Hcl]     Same as Sertraline    Current Outpatient Medications on File Prior to Visit  Medication Sig Dispense Refill  . Aspirin-Salicylamide-Caffeine (BC HEADACHE POWDER PO) Take 1 packet by mouth as needed (headache).    . cyclobenzaprine (FLEXERIL) 5 MG tablet TAKE 1 TABLET (5 MG TOTAL) BY MOUTH 3 (THREE) TIMES DAILY AS NEEDED FOR MUSCLE SPASMS. 30 tablet 1  . Esomeprazole Magnesium (NEXIUM 24HR) 20 MG TBEC Take 40 capsules by mouth at bedtime.     . fluticasone (FLONASE) 50 MCG/ACT nasal spray Place 2 sprays into both nostrils daily. (Patient taking differently: Place 2 sprays into both nostrils as needed for allergies. ) 16 g 6  . glipiZIDE (GLUCOTROL) 10 MG tablet TAKE 1 TABLET BY MOUTH  TWICE DAILY FOR DIABETES. (Patient taking differently: Take 10 mg by mouth 2 (two) times daily before a meal. ) 180 tablet 0  . metFORMIN (GLUCOPHAGE) 1000 MG tablet TAKE 1 TABLET BY MOUTH TWICE DAILY WITH A MEAL FOR DIABETES. (Patient taking differently: Take 1,000 mg by mouth 2 (two) times daily with a meal. ) 180 tablet 3  . naproxen sodium (ALEVE) 220 MG tablet Take 440 mg by mouth daily as needed (pain).    . Omega-3 Fatty Acids (FISH OIL) 1200 MG CPDR Take 1,200 mg by mouth daily.     Marland Kitchen  ondansetron (ZOFRAN-ODT) 8 MG disintegrating tablet Take 1 tablet (8 mg total) by mouth every 8 (eight) hours as needed for nausea. 10 tablet 0  . rosuvastatin (CRESTOR) 5 MG tablet TAKE 1 TABLET BY MOUTH EVERY DAY IN THE EVENING FOR CHOLESTEROL (Patient taking differently: Take 5 mg by mouth daily at 6 PM. ) 90 tablet 3  . triamcinolone cream (KENALOG) 0.1 % APPLY 1 APPLICATION TOPICALLY 2 (TWO) TIMES DAILY. 30 g 1   No current facility-administered medications on file prior to visit.     BP 120/78   Pulse 68   Temp 98.2 F (36.8 C) (Oral)   Ht 5\' 7"  (1.702 m)   Wt 204 lb 12 oz (92.9 kg)   LMP 06/17/2011   SpO2 98%   BMI 32.07 kg/m    Objective:   Physical Exam  Constitutional: She appears well-nourished.  Neck: Neck supple.  Cardiovascular: Normal rate and regular rhythm.  Respiratory: Effort normal and breath sounds normal.  Skin: Skin is warm and dry.  Psychiatric: She has a normal mood and affect.           Assessment & Plan:

## 2018-09-19 NOTE — Assessment & Plan Note (Signed)
Chronic, intermittent, worse recently.  Differential diagnoses include uncontrolled diabetes, thyroid disorder, vitamin deficiency, anxiety/depression, sleep apnea.   Check labs today including vitamin levels, TSH, CBC, A1C. Will also treat anxiety and depression with Wellbutrin.  Consider sleep apnea screening. Await labs.

## 2018-09-19 NOTE — Assessment & Plan Note (Signed)
Chronic, either intolerable or no improvement with numerous medications. Discussed options including trying Wellbutrin vs psychiatry, she opts for Wellbutrin.  Wellbutrin may be an option given symptoms of difficulty focusing, anxiety, depression. Will start with SR 100 mg daily x 1 week, then increase to BID thereafter. Follow up in 6 weeks.

## 2018-09-19 NOTE — Assessment & Plan Note (Signed)
Repeat lipid panel pending.  Continue statin therapy. 

## 2018-09-19 NOTE — Assessment & Plan Note (Signed)
Seems uncontrolled based off of glucose readings. Repeat A1C pending.  Continue glipizide and metformin for now. We did discuss the potential need to add on treatment and she seems interested in Trulicity weekly.   Foot exam today. Managed on statin. Urine microalbumin UTD.

## 2018-09-20 DIAGNOSIS — E119 Type 2 diabetes mellitus without complications: Secondary | ICD-10-CM

## 2018-09-20 MED ORDER — TRULICITY 0.75 MG/0.5ML ~~LOC~~ SOAJ: SUBCUTANEOUS | 1 refills | Status: DC

## 2018-10-06 ENCOUNTER — Other Ambulatory Visit: Payer: Self-pay | Admitting: Primary Care

## 2018-10-06 DIAGNOSIS — E119 Type 2 diabetes mellitus without complications: Secondary | ICD-10-CM

## 2018-10-11 ENCOUNTER — Other Ambulatory Visit: Payer: Self-pay | Admitting: Primary Care

## 2018-10-11 DIAGNOSIS — F411 Generalized anxiety disorder: Secondary | ICD-10-CM

## 2018-10-11 DIAGNOSIS — F331 Major depressive disorder, recurrent, moderate: Secondary | ICD-10-CM

## 2018-10-27 ENCOUNTER — Ambulatory Visit: Payer: 59 | Admitting: Primary Care

## 2018-10-27 ENCOUNTER — Other Ambulatory Visit: Payer: Self-pay

## 2018-10-27 DIAGNOSIS — E119 Type 2 diabetes mellitus without complications: Secondary | ICD-10-CM

## 2018-10-27 DIAGNOSIS — F411 Generalized anxiety disorder: Secondary | ICD-10-CM | POA: Diagnosis not present

## 2018-10-27 DIAGNOSIS — F331 Major depressive disorder, recurrent, moderate: Secondary | ICD-10-CM | POA: Diagnosis not present

## 2018-10-27 MED ORDER — TRULICITY 0.75 MG/0.5ML ~~LOC~~ SOAJ: SUBCUTANEOUS | 1 refills | Status: DC

## 2018-10-27 NOTE — Progress Notes (Signed)
Subjective:    Patient ID: Whitney [name], female    DOB: 12-Sep-1960, 58 y.o.   MRN: [id number]  HPI  Whitney [name] is a 58 year old female who presents today for follow up.  She was last evaluated on 09/19/18 for complaints of fatigue, feeling sleepy/tired, lack of focus, dizziness, headaches, feeling stressed. She underwent full lab work up which really didn't point to a clear cause for symptoms. We discussed that symptoms could be from uncontrolled anxiety and depression so we decided to once again change her treatment. Wellbutrin SR 100 mg BID was provided.  Since her last visit she doesn't wish to continue Wellbutrin given that it "makes me feel like a zombie" and also makes her feel less focused. She has since improved in regards to anxiety and depression as she's learning to "say no" to others that tend to abuse her help/assistance. She is taking Wellbutrin 100 mg once daily as two tablets made her feel awful.   Recently started Trulicity in late June 1856 for uncontrolled diabetes with A1C of 8.8. She does notice symptoms of GI upset with the Trulicity, overall tolerable and would like to continue.   Review of Systems  Respiratory: Negative for shortness of breath.   Cardiovascular: Negative for chest pain.  Gastrointestinal:       GI upset from Trulicity  Neurological: Negative for dizziness and headaches.  Psychiatric/Behavioral:       See HPI       Past Medical History:  Diagnosis Date  . Allergy    Seasonal  . Anxiety   . Attention deficit disorder without mention of hyperactivity   . Barrett's esophagus   . CHOLELITHIASIS 05/26/2007   Qualifier: Diagnosis of  By: Maxie Better FNP, Rosalita Levan   . Congenital anomalies of foot, not elsewhere classified   . CONSTIPATION 10/09/2008   Qualifier: Diagnosis of  By: Maxie Better FNP, Rosalita Levan   . Depression   . Diabetes mellitus without complication ([place])   . Displacement of intervertebral disc, site unspecified, without  myelopathy   . DIVERTICULAR DISEASE 02/06/2002   Qualifier: Diagnosis of  By: Maxie Better FNP, Rosalita Levan   . Diverticulosis   . Elevated blood pressure reading without diagnosis of hypertension   . Esophageal reflux   . Gallstones   . Gastroenteritis 07/27/2016  . Hiatal hernia   . HIATAL HERNIA 10/16/2008   Qualifier: Diagnosis of  By: Maxie Better FNP, Rosalita Levan   . HLD (hyperlipidemia)   . Hypoglycemia, unspecified   . Internal hemorrhoids without mention of complication   . Other abnormal blood chemistry      Social History   Socioeconomic History  . Marital status: Married    Spouse name: Not on file  . Number of children: 2  . Years of education: Not on file  . Highest education level: Not on file  Occupational History  . Occupation: Self employeed- Conservation officer, historic buildings  Social Needs  . Financial resource strain: Not on file  . Food insecurity    Worry: Not on file    Inability: Not on file  . Transportation needs    Medical: Not on file    Non-medical: Not on file  Tobacco Use  . Smoking status: Former Smoker    Types: Cigarettes  . Smokeless tobacco: Never Used  . Tobacco comment: 2 years as a teenager  Substance and Sexual Activity  . Alcohol use: Yes    Alcohol/week: 0.0 standard drinks    Comment: occasional wine  .  Drug use: No  . Sexual activity: Yes  Lifestyle  . Physical activity    Days per week: Not on file    Minutes per session: Not on file  . Stress: Not on file  Relationships  . Social Herbalist on phone: Not on file    Gets together: Not on file    Attends religious service: Not on file    Active member of club or organization: Not on file    Attends meetings of clubs or organizations: Not on file    Relationship status: Not on file  . Intimate partner violence    Fear of current or ex partner: Not on file    Emotionally abused: Not on file    Physically abused: Not on file    Forced sexual activity: Not on file  Other  Topics Concern  . Not on file  Social History Narrative  . Not on file    Past Surgical History:  Procedure Laterality Date  . BREAST CYST EXCISION Left   . CARDIAC CATHETERIZATION     [facility]  . CESAREAN SECTION    . CHOLECYSTECTOMY  06/16/2007   Dr. Hulen Skains  . COLONOSCOPY  02/06/2002   diverticula and internal hemorroids  . HIATAL HERNIA REPAIR    . LUMBAR LAMINECTOMY     L5-S1    Family History  Problem Relation Age of Onset  . Colon polyps Mother   . Hypertension Father   . Diabetes Father   . Heart disease Father   . Colon polyps Maternal Grandmother   . Heart attack Maternal Grandmother   . Heart attack Paternal Grandfather   . Diabetes Maternal Aunt   . Prostate cancer Maternal Grandfather   . Colon cancer Paternal Grandmother     Allergies  Allergen Reactions  . Sertraline Hcl     REACTION: Headache/ vision changes  . Zoloft [Sertraline Hcl]     Same as Sertraline    Current Outpatient Medications on File Prior to Visit  Medication Sig Dispense Refill  . Aspirin-Salicylamide-Caffeine (BC HEADACHE POWDER PO) Take 1 packet by mouth as needed (headache).    . cyclobenzaprine (FLEXERIL) 5 MG tablet TAKE 1 TABLET (5 MG TOTAL) BY MOUTH 3 (THREE) TIMES DAILY AS NEEDED FOR MUSCLE SPASMS. 30 tablet 1  . Esomeprazole Magnesium (NEXIUM 24HR) 20 MG TBEC Take 40 capsules by mouth at bedtime.     . fluticasone (FLONASE) 50 MCG/ACT nasal spray Place 2 sprays into both nostrils daily. (Patient taking differently: Place 2 sprays into both nostrils as needed for allergies. ) 16 g 6  . glipiZIDE (GLUCOTROL) 10 MG tablet TAKE 1 TABLET BY MOUTH TWICE DAILY FOR DIABETES 180 tablet 0  . metFORMIN (GLUCOPHAGE) 1000 MG tablet TAKE 1 TABLET BY MOUTH TWICE DAILY WITH A MEAL FOR DIABETES. (Patient taking differently: Take 1,000 mg by mouth 2 (two) times daily with a meal. ) 180 tablet 3  . naproxen sodium (ALEVE) 220 MG tablet Take 440 mg by mouth daily as needed (pain).    . Omega-3 Fatty  Acids (FISH OIL) 1200 MG CPDR Take 1,200 mg by mouth daily.     . ondansetron (ZOFRAN-ODT) 8 MG disintegrating tablet Take 1 tablet (8 mg total) by mouth every 8 (eight) hours as needed for nausea. 10 tablet 0  . rosuvastatin (CRESTOR) 5 MG tablet TAKE 1 TABLET BY MOUTH EVERY DAY IN THE EVENING FOR CHOLESTEROL (Patient taking differently: Take 5 mg by mouth daily at 6  PM. ) 90 tablet 3  . triamcinolone cream (KENALOG) 0.1 % APPLY 1 APPLICATION TOPICALLY 2 (TWO) TIMES DAILY. 30 g 1   No current facility-administered medications on file prior to visit.     BP 124/84   Pulse 77   Temp 97.9 F (36.6 C) (Temporal)   Ht 5\' 7"  (1.702 m)   Wt 202 lb 8 oz (91.9 kg)   LMP 06/17/2011   SpO2 97%   BMI 31.72 kg/m    Objective:   Physical Exam  Constitutional: She appears well-nourished.  Neck: Neck supple.  Cardiovascular: Normal rate and regular rhythm.  Respiratory: Effort normal and breath sounds normal.  Skin: Skin is warm and dry.  Psychiatric: She has a normal mood and affect.           Assessment & Plan:

## 2018-10-27 NOTE — Patient Instructions (Signed)
Reduce your Wellbutrin to 1/2 tablet daily for one week then stop.  Continue weekly Trulicity injections, please notify me if the side effects are intolerable.  Schedule a lab only appointment for on or after September 23rd for diabetes check.  It was a pleasure to see you today!

## 2018-10-27 NOTE — Assessment & Plan Note (Signed)
GI side effects to Trulicity but she would like to continue for another few months before stopping. Refill for 3 month supply sent to pharmacy.  Repeat A1C in late September.

## 2018-10-27 NOTE — Assessment & Plan Note (Signed)
No improvement with Wellbutrin per patient. She is learning to reduce her own stress by taking time for herself and saying no to others, this seems to be working.  Offered therapy vs psychiatry and she kindly declines. Discussed weaning instructions for Wellbutrin. She will update.

## 2018-12-05 NOTE — Progress Notes (Signed)
Whitney Christopher T. Whitney Corro, MD Primary Care and Sports Medicine [name], [address] Phone: [phone]  FAX: [name] - 58 y.o. female  MRN [id number]  Date of Birth: 05/11/1960  Visit Date: 12/06/2018  PCP: [name], NP  Referred by: [name], NP  Chief Complaint  Patient presents with  . Back Pain    Mid-Right Side of back  . Fatigue   Subjective:   Whitney [name] is a 58 y.o. very pleasant female patient with Body mass index is 31.4 kg/m. who presents with the following:  Whitney [name] is a very nice young lady who presents with low back pain as well as some pain into her leg on the right side.  Worsening with time. Nothing she takes helps.   5 months dull to mid ache in r lower back.  She does have a significant history of lumbar laminectomy at L5-S1 done distantly by Dr. [name].  Since that time she has had some intermittent back pain, but she has done relatively well.  She is not having any radicular symptoms, numbness, or tingling.  She wonders whether or not this could be a medical issue such as that with her kidneys, kidney stone, or possibly a side effect from Trulicity or any other medical problem.  I did review all of her labs with her most recently, and she also has no blood in her urine.  L5-S1 lumbar laminectomy different. Dr [name] did it.   Taking Trulicity.  Kidney stone, urine stone?  Past Medical History, Surgical History, Social History, Family History, Problem List, Medications, and Allergies have been reviewed and updated if relevant.  Patient Active Problem List   Diagnosis Date Noted  . Frequent headaches 10/14/2015  . Fatigue 05/05/2015  . Anemia, iron deficiency 12/30/2014  . Type 2 diabetes mellitus ([place]) 10/15/2014  . Microcytic anemia 10/15/2014  . OBESITY 07/08/2009  . BARRETTS ESOPHAGUS 10/16/2008  . HLD (hyperlipidemia) 10/09/2008  . ADD  07/24/2007  . ELEVATED BLOOD PRESSURE WITHOUT DIAGNOSIS OF HYPERTENSION 03/10/2007  . G E R D 09/27/2006  . MDD (major depressive disorder) 08/31/2006  . GAD (generalized anxiety disorder) 08/17/2006    Past Medical History:  Diagnosis Date  . Allergy    Seasonal  . Anxiety   . Attention deficit disorder without mention of hyperactivity   . Barrett's esophagus   . CHOLELITHIASIS 05/26/2007   Qualifier: Diagnosis of  By: Maxie Better FNP, Rosalita Levan   . Congenital anomalies of foot, not elsewhere classified   . CONSTIPATION 10/09/2008   Qualifier: Diagnosis of  By: Maxie Better FNP, Rosalita Levan   . Depression   . Diabetes mellitus without complication ([place])   . Displacement of intervertebral disc, site unspecified, without myelopathy   . DIVERTICULAR DISEASE 02/06/2002   Qualifier: Diagnosis of  By: Maxie Better FNP, Rosalita Levan   . Diverticulosis   . Elevated blood pressure reading without diagnosis of hypertension   . Esophageal reflux   . Gallstones   . Gastroenteritis 07/27/2016  . Hiatal hernia   . HIATAL HERNIA 10/16/2008   Qualifier: Diagnosis of  By: Maxie Better FNP, Rosalita Levan   . HLD (hyperlipidemia)   . Hypoglycemia, unspecified   . Internal hemorrhoids without mention of complication   . Other abnormal blood chemistry     Past Surgical History:  Procedure Laterality Date  . BREAST CYST EXCISION Left   . CARDIAC CATHETERIZATION  [facility]  . CESAREAN SECTION    . CHOLECYSTECTOMY  06/16/2007   Dr. Hulen Skains  . COLONOSCOPY  02/06/2002   diverticula and internal hemorroids  . HIATAL HERNIA REPAIR    . LUMBAR LAMINECTOMY     L5-S1    Social History   Socioeconomic History  . Marital status: Married    Spouse name: Not on file  . Number of children: 2  . Years of education: Not on file  . Highest education level: Not on file  Occupational History  . Occupation: Self employeed- Conservation officer, historic buildings  Social Needs  . Financial resource strain: Not on file  . Food  insecurity    Worry: Not on file    Inability: Not on file  . Transportation needs    Medical: Not on file    Non-medical: Not on file  Tobacco Use  . Smoking status: Former Smoker    Types: Cigarettes  . Smokeless tobacco: Never Used  . Tobacco comment: 2 years as a teenager  Substance and Sexual Activity  . Alcohol use: Yes    Alcohol/week: 0.0 standard drinks    Comment: occasional wine  . Drug use: No  . Sexual activity: Yes  Lifestyle  . Physical activity    Days per week: Not on file    Minutes per session: Not on file  . Stress: Not on file  Relationships  . Social Herbalist on phone: Not on file    Gets together: Not on file    Attends religious service: Not on file    Active member of club or organization: Not on file    Attends meetings of clubs or organizations: Not on file    Relationship status: Not on file  . Intimate partner violence    Fear of current or ex partner: Not on file    Emotionally abused: Not on file    Physically abused: Not on file    Forced sexual activity: Not on file  Other Topics Concern  . Not on file  Social History Narrative  . Not on file    Family History  Problem Relation Age of Onset  . Colon polyps Mother   . Hypertension Father   . Diabetes Father   . Heart disease Father   . Colon polyps Maternal Grandmother   . Heart attack Maternal Grandmother   . Heart attack Paternal Grandfather   . Diabetes Maternal Aunt   . Prostate cancer Maternal Grandfather   . Colon cancer Paternal Grandmother     Allergies  Allergen Reactions  . Sertraline Hcl     REACTION: Headache/ vision changes  . Zoloft [Sertraline Hcl]     Same as Sertraline    Medication list reviewed and updated in full in [place].  GEN: no acute illness or fever CV: No chest pain or shortness of breath MSK: detailed above Neuro: neurological signs are described above ROS O/w per HPI  Objective:   BP 110/78   Pulse 96   Temp  98.3 F (36.8 C) (Temporal)   Ht 5\' 7"  (1.702 m)   Wt 200 lb 8 oz (90.9 kg)   LMP 06/17/2011   SpO2 97%   BMI 31.40 kg/m    GEN: Well-developed,well-nourished,in no acute distress; alert,appropriate and cooperative throughout examination HEENT: Normocephalic and atraumatic without obvious abnormalities. Ears, externally no deformities PULM: Breathing comfortably in no respiratory distress EXT: No clubbing, cyanosis, or edema PSYCH: Normally interactive. Cooperative during the interview.  Pleasant. Friendly and conversant. Not anxious or depressed appearing. Normal, full affect.  Range of motion at  the waist: Flexion: normal Extension: normal Lateral bending: normal Rotation: all normal  No echymosis or edema Rises to examination table with no difficulty Gait: non antalgic  Inspection/Deformity: N Paraspinus Tenderness: mild L3-s1 b  B Ankle Dorsiflexion (L5,4): 5/5 B Great Toe Dorsiflexion (L5,4): 5/5 Heel Walk (L5): WNL Toe Walk (S1): WNL Rise/Squat (L4): WNL  SENSORY B Medial Foot (L4): WNL B Dorsum (L5): WNL B Lateral (S1): WNL Light Touch: WNL Pinprick: WNL  REFLEXES Knee (L4): 2+ Ankle (S1): 2+  B SLR, seated: neg B SLR, supine: neg B FABER: neg B Reverse FABER: neg B Greater Troch: NT B Log Roll: neg B Stork: NT B Sciatic Notch: NT  Radiology: Dg Lumbar Spine Complete  Result Date: 12/06/2018 CLINICAL DATA:  Low back pain status post laminectomy. EXAM: LUMBAR SPINE - COMPLETE 4+ VIEW COMPARISON:  Radiographs of January 06, 2009. FINDINGS: No fracture or spondylolisthesis is noted. Severe degenerative disc disease is noted at L4-5 and L5-S1. IMPRESSION: Severe multilevel degenerative disc disease. No acute abnormality seen in the lumbar spine. Electronically Signed   By: Marijo Conception M.D.   On: 12/06/2018 16:53     Assessment and Plan:     ICD-10-CM   1. Right-sided low back pain without sciatica, unspecified chronicity  M54.5 POCT Urinalysis  Dipstick (Automated)    Urine Culture    DG Lumbar Spine Complete  2. Need for influenza vaccination  Z23 Flu Vaccine QUAD 6+ mos PF IM (Fluarix Quad PF)  3. History of lumbar laminectomy  Z98.890    She is having some right-sided back pain without radiculopathy in the setting of prior lumbar laminectomy.  She does have severe degenerative disc disease throughout her lumbar spine, and this certainly makes quite a bit of difference.  I am in a place her on a course of prednisone, and we will see how she responds to this.  If she fails, then we can progress with physical therapy, potentially [facility].  Follow-up: No follow-ups on file.  Meds ordered this encounter  Medications  . predniSONE (DELTASONE) 20 MG tablet    Sig: 1 tab po for 10 days    Dispense:  10 tablet    Refill:  0   Orders Placed This Encounter  Procedures  . Urine Culture  . DG Lumbar Spine Complete  . Flu Vaccine QUAD 6+ mos PF IM (Fluarix Quad PF)  . POCT Urinalysis Dipstick (Automated)    Signed,  Magic Mohler T. Glover Capano, MD   Outpatient Encounter Medications as of 12/06/2018  Medication Sig  . Aspirin-Salicylamide-Caffeine (BC HEADACHE POWDER PO) Take 1 packet by mouth as needed (headache).  . cyclobenzaprine (FLEXERIL) 5 MG tablet TAKE 1 TABLET (5 MG TOTAL) BY MOUTH 3 (THREE) TIMES DAILY AS NEEDED FOR MUSCLE SPASMS.  . Dulaglutide (TRULICITY) A999333 0000000 SOPN Inject into the skin once weekly for diabetes.  . Esomeprazole Magnesium (NEXIUM 24HR) 20 MG TBEC Take 40 capsules by mouth at bedtime.   . fluticasone (FLONASE) 50 MCG/ACT nasal spray Place 2 sprays into both nostrils daily. (Patient taking differently: Place 2 sprays into both nostrils as needed for allergies. )  . glipiZIDE (GLUCOTROL) 10 MG tablet TAKE 1 TABLET BY MOUTH TWICE DAILY FOR DIABETES  . metFORMIN (GLUCOPHAGE) 1000 MG tablet TAKE 1 TABLET BY MOUTH TWICE DAILY WITH A MEAL FOR DIABETES. (Patient taking differently: Take 1,000 mg by mouth 2 (two)  times daily with a meal. )  . naproxen sodium (ALEVE) 220 MG tablet Take 440 mg by mouth daily as needed (pain).  . Omega-3 Fatty Acids (FISH OIL) 1200 MG CPDR Take 1,200 mg by mouth daily.   . ondansetron (ZOFRAN-ODT) 8 MG disintegrating tablet Take 1 tablet (8 mg total) by mouth every 8 (eight) hours as needed for nausea.  . rosuvastatin (CRESTOR) 5 MG tablet TAKE 1 TABLET BY MOUTH EVERY DAY IN THE EVENING FOR CHOLESTEROL (Patient taking differently: Take 5 mg by mouth daily at 6 PM. )  . triamcinolone cream (KENALOG) 0.1 % APPLY 1 APPLICATION TOPICALLY 2 (TWO) TIMES DAILY.  Marland Kitchen predniSONE (DELTASONE) 20 MG tablet 1 tab po for 10 days   No facility-administered encounter medications on file as of 12/06/2018.

## 2018-12-06 ENCOUNTER — Ambulatory Visit (INDEPENDENT_AMBULATORY_CARE_PROVIDER_SITE_OTHER)
Admission: RE | Admit: 2018-12-06 | Discharge: 2018-12-06 | Disposition: A | Discharge status: Home / Self Care | Payer: 59 | Source: Ambulatory Visit | Attending: Family Medicine | Admitting: Family Medicine

## 2018-12-06 ENCOUNTER — Ambulatory Visit: Payer: 59 | Admitting: Family Medicine

## 2018-12-06 ENCOUNTER — Other Ambulatory Visit: Payer: Self-pay

## 2018-12-06 ENCOUNTER — Encounter: Payer: Self-pay | Admitting: Family Medicine

## 2018-12-06 VITALS — BP 110/78 | HR 96 | Temp 98.3°F | Ht 67.0 in | Wt 200.5 lb

## 2018-12-06 DIAGNOSIS — Z23 Encounter for immunization: Secondary | ICD-10-CM

## 2018-12-06 DIAGNOSIS — M545 Low back pain, unspecified: Secondary | ICD-10-CM

## 2018-12-06 DIAGNOSIS — Z9889 Other specified postprocedural states: Secondary | ICD-10-CM

## 2018-12-06 LAB — POC URINALSYSI DIPSTICK (AUTOMATED)
Bilirubin, UA: NEGATIVE
Blood, UA: NEGATIVE
Glucose, UA: NEGATIVE
Ketones, UA: NEGATIVE
Nitrite, UA: NEGATIVE
Protein, UA: NEGATIVE
Spec Grav, UA: 1.015 (ref 1.010–1.025)
Urobilinogen, UA: 0.2 E.U./dL
pH, UA: 6 (ref 5.0–8.0)

## 2018-12-06 MED ORDER — PREDNISONE 20 MG PO TABS: ORAL_TABLET | ORAL | 0 refills | Status: DC

## 2018-12-08 LAB — URINE CULTURE
MICRO NUMBER:: 861250
SPECIMEN QUALITY:: ADEQUATE

## 2018-12-21 ENCOUNTER — Telehealth: Payer: Self-pay | Admitting: Primary Care

## 2018-12-21 NOTE — Telephone Encounter (Signed)
Patient called and is requesting to transfer back to [place] from [place].  Patient said she misses seeing Dr.Aron. Can patient switch?

## 2018-12-22 NOTE — Telephone Encounter (Signed)
Patient notified and PCP changed in [place].

## 2018-12-22 NOTE — Telephone Encounter (Signed)
Fine with me, thanks. 

## 2018-12-22 NOTE — Telephone Encounter (Signed)
Yes okay with me.

## 2019-01-19 ENCOUNTER — Encounter: Payer: Self-pay | Admitting: Family Medicine

## 2019-01-25 ENCOUNTER — Other Ambulatory Visit: Payer: Self-pay | Admitting: Neurosurgery

## 2019-01-25 DIAGNOSIS — M5412 Radiculopathy, cervical region: Secondary | ICD-10-CM

## 2019-01-25 DIAGNOSIS — M5416 Radiculopathy, lumbar region: Secondary | ICD-10-CM

## 2019-02-10 ENCOUNTER — Other Ambulatory Visit: Payer: Self-pay | Admitting: Primary Care

## 2019-02-10 DIAGNOSIS — E119 Type 2 diabetes mellitus without complications: Secondary | ICD-10-CM

## 2019-02-12 ENCOUNTER — Telehealth: Payer: Self-pay

## 2019-02-12 NOTE — Telephone Encounter (Signed)
Would you guys agree to this statement? Please advise.

## 2019-02-12 NOTE — Telephone Encounter (Signed)
Copied from [place] [id number]. Topic: Appointment Scheduling - Transfer of Care >> Feb 12, 2019 11:39 AM Pilar Grammes F wrote:  Pt is requesting to transfer FROM: Dr. Arnette Norris Pt is requesting to transfer TO: Dr. Olivia Mackie McLean-Scocuzza Reason for requested transfer:  She was told that Dr. Deborra Medina is leaving at the end of the year.  Send CRM to patient's current PCP (transferring FROM).

## 2019-02-13 NOTE — Telephone Encounter (Signed)
I am not taking new patients   [place]

## 2019-02-16 ENCOUNTER — Ambulatory Visit
Admission: RE | Admit: 2019-02-16 | Discharge: 2019-02-16 | Disposition: A | Discharge status: Home / Self Care | Payer: 59 | Source: Ambulatory Visit | Attending: Neurosurgery | Admitting: Neurosurgery

## 2019-02-16 ENCOUNTER — Other Ambulatory Visit: Payer: Self-pay

## 2019-02-16 DIAGNOSIS — M5416 Radiculopathy, lumbar region: Secondary | ICD-10-CM

## 2019-02-16 DIAGNOSIS — M5412 Radiculopathy, cervical region: Secondary | ICD-10-CM

## 2019-02-16 MED ORDER — GADOBENATE DIMEGLUMINE 529 MG/ML IV SOLN
18.0000 mL | Freq: Once | INTRAVENOUS | Status: AC | PRN
  Administered 2019-02-16: 18 mL via INTRAVENOUS

## 2019-04-26 ENCOUNTER — Other Ambulatory Visit: Payer: Self-pay | Admitting: Primary Care

## 2019-04-26 DIAGNOSIS — E119 Type 2 diabetes mellitus without complications: Secondary | ICD-10-CM

## 2019-05-08 ENCOUNTER — Telehealth: Payer: Self-pay | Admitting: Internal Medicine

## 2019-05-08 NOTE — Telephone Encounter (Signed)
Pt's mother Despina Hidden) called and stated that Dr Derrel Nip would accept Whitney [name] as a new pt about a year ago. I just want to confirm that. Thanks

## 2019-05-08 NOTE — Telephone Encounter (Signed)
I can no longer accept any new patients.  My case load is too high.

## 2019-05-09 ENCOUNTER — Other Ambulatory Visit: Payer: Self-pay | Admitting: Family Medicine

## 2019-05-09 DIAGNOSIS — E119 Type 2 diabetes mellitus without complications: Secondary | ICD-10-CM

## 2019-05-09 NOTE — Telephone Encounter (Signed)
This patient transferred care from Dr. Deborra Medina to me, then back to Dr. Deborra Medina. She needs to decide who she will see.  If it's me then I will refill for 30 days and she'll need an office visit for follow up. Please schedule. Thanks!

## 2019-05-09 NOTE — Telephone Encounter (Signed)
KC-Plz see refill req/Dr. Deborra Medina has not seen since 2018/plz advise/thx dmf

## 2019-05-10 NOTE — Telephone Encounter (Signed)
Has not been seen by Dr. Deborra Medina since 2018/thx dmf

## 2019-05-10 NOTE — Telephone Encounter (Signed)
Pt is aware and transferred up front to [place] to schedule new pt appt with another provider

## 2019-05-10 NOTE — Telephone Encounter (Signed)
Whitney [name], see notes. Ms. Albertina Parr doesn't seem to have contacted patient as requested.  Will you contact patient to see what she plans on doing? I see a phone note regarding Dr. Derrel Nip but it doesn't appear that she's taking new patients. If she's going to see me then needs to come in for an office visit for diabetes follow up.  If she's almost out of her glipizide then I can refill short term until she gets in with preferred PCP.

## 2019-05-11 MED ORDER — GLIPIZIDE 10 MG PO TABS: ORAL_TABLET | ORAL | 0 refills | Status: DC

## 2019-05-11 NOTE — Telephone Encounter (Signed)
Spoken and notified patient of Whitney [name] comments. Patient stated that please go ahead and refill a short term of Rx until patient is seen on 05/29/2019 at [place] station.   Refill sent as requested.

## 2019-05-29 ENCOUNTER — Encounter: Payer: 59 | Admitting: Nurse Practitioner

## 2019-05-29 ENCOUNTER — Ambulatory Visit: Payer: 59 | Admitting: Nurse Practitioner

## 2019-05-29 ENCOUNTER — Other Ambulatory Visit: Payer: Self-pay

## 2019-05-29 ENCOUNTER — Encounter: Payer: Self-pay | Admitting: Nurse Practitioner

## 2019-05-29 VITALS — BP 126/82 | HR 94 | Temp 96.5°F | Resp 16 | Ht 66.5 in | Wt 191.8 lb

## 2019-05-29 DIAGNOSIS — E119 Type 2 diabetes mellitus without complications: Secondary | ICD-10-CM

## 2019-05-29 DIAGNOSIS — E785 Hyperlipidemia, unspecified: Secondary | ICD-10-CM

## 2019-05-29 DIAGNOSIS — S0990XS Unspecified injury of head, sequela: Secondary | ICD-10-CM

## 2019-05-29 DIAGNOSIS — R0683 Snoring: Secondary | ICD-10-CM

## 2019-05-29 DIAGNOSIS — G44309 Post-traumatic headache, unspecified, not intractable: Secondary | ICD-10-CM

## 2019-05-29 NOTE — Progress Notes (Addendum)
Subjective:    Patient ID: Whitney [name], female    DOB: [birth year], 59 y.o.   MRN: [id number]  HPI 59 yo female comes in today to establish care with a primary care provider .  DM2: Onset at least 5 years ago and presents on metformin 1 gm BID, and glipizide 10 mg BID, and Trulicity. She does not like Trulicity as it makes her   sleepy the day of the injection.  She is tolerating it, however and it is helping her DM. She does not want to change her therapy.  Hyperlipidemia: Taking Crestor 5 mg daily.  She has had no muscle pain, or GI symptoms with this.  Lab Results  Component Value Date   CHOL 157 05/29/2019   CHOL 146 09/19/2018   CHOL 143 08/03/2017   Lab Results  Component Value Date   HDL 50.50 05/29/2019   HDL 48.60 09/19/2018   HDL 47.50 08/03/2017   Lab Results  Component Value Date   LDLCALC 58 09/19/2018   LDLCALC 66 08/03/2017   LDLCALC 178 (H) 01/31/2014   Lab Results  Component Value Date   TRIG 243.0 (H) 05/29/2019   TRIG 198.0 (H) 09/19/2018   TRIG 148.0 08/03/2017   Lab Results  Component Value Date   CHOLHDL 3 05/29/2019   CHOLHDL 3 09/19/2018   CHOLHDL 3 08/03/2017   Lab Results  Component Value Date   LDLDIRECT 75.0 05/29/2019   LDLDIRECT 105.0 06/21/2017   LDLDIRECT 103.0 07/27/2016    Obesity: She is trying Optivia diet and it has helped her blood sugar. She is eating every 2.5 hours and drinking a gallon of water per day. She eats bars, and lean and green meal at supper. She has lost from 207 lbs to 188 lbs on home scales.She checks her sugars once a day- 130  At the highest. Rare hypoglycemia episodes, occurred  last week -60's . She felt dizzy with it. She skipped her snacks. She ate her snack and recovered. Her bilateral  feet feel like they are asleep, numb-onset 6 mos ago. She is wondering peripheral neuropathy versus her lumbar DDD. This foot numbness 85% resolved with the back. No leg swelling.   Wt Readings from Last 3 Encounters:   05/29/19 191 lb 12.8 oz (87 kg)  12/06/18 200 lb 8 oz (90.9 kg)  10/27/18 202 lb 8 oz (91.9 kg)    GERD with Barrett's and  HH repair:  Nexium 40 mg nightly controls heartburn and she was advised by her GI to take indefinitely. No dysphagia.   Anxious at times- extended family in her home. A very busy life. 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 She sometimes over extends. No serious anxiety. She just gets tired.   HA daily tension type around her head. She takes Goody's everyday for a year or more.   Depression: Previous use of Wellbutrin.  Declined therapy and psychiatry in the past.  Back pain with left leg radiculopathy  MR lumbar spine 02/16/19. She had injections. Some plantar fascitis in the foot. She will have f/up for more steroid shots Dr.Barko.   GYN follows annually for breast exam and Pap.   Lab Results  Component Value Date   HGBA1C 7.4 (H) 05/29/2019   Review of Systems  Constitutional: Negative for appetite change, chills and fever.  HENT: Negative for congestion and sore throat.   Eyes: Negative for visual disturbance.  Respiratory: Positive for apnea. Negative for cough and shortness of breath.  Cardiovascular: Negative for chest pain.  Gastrointestinal: Negative for abdominal pain, constipation and diarrhea.  Endocrine: Negative for cold intolerance, heat intolerance, polydipsia and polyuria.  Genitourinary: Negative for difficulty urinating.  Musculoskeletal: Positive for back pain and neck pain.       Foot pain-see HPI  Neurological: Positive for headaches.  Hematological: Negative for adenopathy.  Psychiatric/Behavioral:       No active  problems with depression, stable  anxiety.   Past Medical History:  Diagnosis Date  . Allergy    Seasonal  . Anxiety   . Attention deficit disorder without mention of hyperactivity   . Barrett's esophagus   . CHOLELITHIASIS 05/26/2007   Qualifier: Diagnosis of  By: Maxie Better FNP, Rosalita Levan   . Congenital  anomalies of foot, not elsewhere classified   . CONSTIPATION 10/09/2008   Qualifier: Diagnosis of  By: Maxie Better FNP, Rosalita Levan   . Depression   . Diabetes mellitus without complication ([place])   . Displacement of intervertebral disc, site unspecified, without myelopathy   . DIVERTICULAR DISEASE 02/06/2002   Qualifier: Diagnosis of  By: Maxie Better FNP, Rosalita Levan   . Diverticulosis   . Elevated blood pressure reading without diagnosis of hypertension   . Esophageal reflux   . Gallstones   . Gastroenteritis 07/27/2016  . Hiatal hernia   . HIATAL HERNIA 10/16/2008   Qualifier: Diagnosis of  By: Maxie Better FNP, Rosalita Levan   . HLD (hyperlipidemia)   . Hypoglycemia, unspecified   . Internal hemorrhoids without mention of complication   . Other abnormal blood chemistry       Past Surgical History:  Procedure Laterality Date  . BREAST CYST EXCISION Left   . CARDIAC CATHETERIZATION     [facility]  . CESAREAN SECTION    . CHOLECYSTECTOMY  06/16/2007   Dr. Hulen Skains  . COLONOSCOPY  02/06/2002   diverticula and internal hemorroids  . HIATAL HERNIA REPAIR    . LUMBAR LAMINECTOMY     L5-S1    Family History  Problem Relation Age of Onset  . Colon polyps Mother   . Hypertension Father   . Diabetes Father   . Heart disease Father   . Colon polyps Maternal Grandmother   . Heart attack Maternal Grandmother   . Heart attack Paternal Grandfather   . Diabetes Maternal Aunt   . Prostate cancer Maternal Grandfather   . Colon cancer Paternal Grandmother     Allergies  Allergen Reactions  . Sertraline Hcl     REACTION: Headache/ vision changes  . Zoloft [Sertraline Hcl]     Same as Sertraline    Current Outpatient Medications on File Prior to Visit  Medication Sig Dispense Refill  . Aspirin-Salicylamide-Caffeine (BC HEADACHE POWDER PO) Take 1 packet by mouth as needed (headache).    . cyclobenzaprine (FLEXERIL) 5 MG tablet TAKE 1 TABLET (5 MG TOTAL) BY MOUTH 3 (THREE) TIMES DAILY AS  NEEDED FOR MUSCLE SPASMS. 30 tablet 1  . Dulaglutide (TRULICITY) A999333 0000000 SOPN Inject into the skin once weekly for diabetes. 6 mL 1  . Esomeprazole Magnesium (NEXIUM 24HR) 20 MG TBEC Take 40 capsules by mouth at bedtime.     . fluticasone (FLONASE) 50 MCG/ACT nasal spray Place 2 sprays into both nostrils daily. (Patient taking differently: Place 2 sprays into both nostrils as needed for allergies. ) 16 g 6  . glipiZIDE (GLUCOTROL) 10 MG tablet TAKE 1 TABLET BY MOUTH TWICE DAILY FOR DIABETES 60 tablet 0  . metFORMIN (GLUCOPHAGE) 1000 MG tablet  TAKE 1 TABLET BY MOUTH TWICE DAILY WITH A MEAL FOR DIABETES. (Patient taking differently: Take 1,000 mg by mouth 2 (two) times daily with a meal. ) 180 tablet 3  . Omega-3 Fatty Acids (FISH OIL) 1200 MG CPDR Take 1,200 mg by mouth daily.     . rosuvastatin (CRESTOR) 5 MG tablet TAKE 1 TABLET BY MOUTH EVERY DAY IN THE EVENING FOR CHOLESTEROL (Patient taking differently: Take 5 mg by mouth daily at 6 PM. ) 90 tablet 3  . triamcinolone cream (KENALOG) 0.1 % APPLY 1 APPLICATION TOPICALLY 2 (TWO) TIMES DAILY. 30 g 1   No current facility-administered medications on file prior to visit.    BP 126/82   Pulse 94   Temp (!) 96.5 F (35.8 C) (Temporal)   Resp 16   Ht 5' 6.5" (1.689 m)   Wt 191 lb 12.8 oz (87 kg)   LMP 06/17/2011   SpO2 96%   BMI 30.49 kg/m      Objective:   Physical Exam Constitutional:      Appearance: Normal appearance.  Cardiovascular:     Rate and Rhythm: Normal rate and regular rhythm.  Pulmonary:     Effort: Pulmonary effort is normal.     Breath sounds: Normal breath sounds.  Abdominal:     Palpations: Abdomen is soft.     Tenderness: There is no abdominal tenderness.  Skin:    General: Skin is warm and dry.  Neurological:     General: No focal deficit present.     Mental Status: She is alert.  Psychiatric:        Mood and Affect: Mood normal.        Behavior: Behavior normal.        Thought Content: Thought  content normal.    DM Foot exam performed and is normal bilat     Assessment & Plan:   DM2: Remain on Glucotrol 10 mg daily, Metformin 123XX123 mg daily, Trulicity and will check labs and A1c. Please go to the lab today.  Her bilateral  feet feel like they are asleep, numb-onset 6 mos ago. Normal foot exam.   HLD: Remain on Crestor 5 mg daily. needs Healthy diet and exercise for diet diabetes management.  Obesity: Congratulations on your weight loss and keep up the good work! We will see how your labs look.   Daily HA: Refrain from taking daily Goody's if you can.Chronic HA-mild, tension type no red flags. Tylenol is a better option for occasional use. Concern about sleep apnea and  I have placed a referral for PULMONARY and they will call you to arrange a sleep apnea study. If negative for sleep apnea-will refer to Neurology for chronic HA. Will see how she does off daily NSAID's- maybe getting rebound HA.   Please see me back in 1 month for CPE and recheck.   COVID-19 Vaccine Information can be found at: [email or web address].co.uk For questions related to vaccine distribution or appointments, please email vaccine@ .com or call [phone])202-4717.   This visit occurred during the SARS-CoV-2 public health emergency.  Safety protocols were in place, including screening questions prior to the visit, additional usage of staff PPE, and extensive cleaning of exam room while observing appropriate contact time as indicated for disinfecting solutions.   Denice Paradise, ANP

## 2019-05-29 NOTE — Patient Instructions (Addendum)
Please go to the lab today.   I have placed a referral for PULMONARY and they will call you to arrange a sleep apnea study.  Refrain from taking daily Goody's if you can. Tylenol is a better otpion for occasional use.   Congratulations on your weight loss and keep up the good work! We will see how your labs look.   You have a history of Barrett's esophagus so you must stay on the Nexium as you are doing.   Please see me back in 1 month for CPE and recheck.   COVID-19 Vaccine Information can be found at: [email or web address].uk For questions related to vaccine distribution or appointments, please email [email or web address] or call [phone].    Sleep Apnea Sleep apnea is a condition in which breathing pauses or becomes shallow during sleep. Episodes of sleep apnea usually last 10 seconds or longer, and they may occur as many as 20 times an hour. Sleep apnea disrupts your sleep and keeps your body from getting the rest that it needs. This condition can increase your risk of certain health problems, including:  Heart attack.  Stroke.  Obesity.  Diabetes.  Heart failure.  Irregular heartbeat. What are the causes? There are three kinds of sleep apnea:  Obstructive sleep apnea. This kind is caused by a blocked or collapsed airway.  Central sleep apnea. This kind happens when the part of the brain that controls breathing does not send the correct signals to the muscles that control breathing.  Mixed sleep apnea. This is a combination of obstructive and central sleep apnea. The most common cause of this condition is a collapsed or blocked airway. An airway can collapse or become blocked if:  Your throat muscles are abnormally relaxed.  Your tongue and tonsils are larger than normal.  You are overweight.  Your airway is smaller than normal. What increases the risk? You are more likely to develop this condition if  you:  Are overweight.  Smoke.  Have a smaller than normal airway.  Are elderly.  Are female.  Drink alcohol.  Take sedatives or tranquilizers.  Have a family history of sleep apnea. What are the signs or symptoms? Symptoms of this condition include:  Trouble staying asleep.  Daytime sleepiness and tiredness.  Irritability.  Loud snoring.  Morning headaches.  Trouble concentrating.  Forgetfulness.  Decreased interest in sex.  Unexplained sleepiness.  Mood swings.  Personality changes.  Feelings of depression.  Waking up often during the night to urinate.  Dry mouth.  Sore throat. How is this diagnosed? This condition may be diagnosed with:  A medical history.  A physical exam.  A series of tests that are done while you are sleeping (sleep study). These tests are usually done in a sleep lab, but they may also be done at home. How is this treated? Treatment for this condition aims to restore normal breathing and to ease symptoms during sleep. It may involve managing health issues that can affect breathing, such as high blood pressure or obesity. Treatment may include:  Sleeping on your side.  Using a decongestant if you have nasal congestion.  Avoiding the use of depressants, including alcohol, sedatives, and narcotics.  Losing weight if you are overweight.  Making changes to your diet.  Quitting smoking.  Using a device to open your airway while you sleep, such as: ? An oral appliance. This is a custom-made mouthpiece that shifts your lower jaw forward. ? A continuous positive airway pressure (CPAP) device. This device blows  air through a mask when you breathe out (exhale). ? A nasal expiratory positive airway pressure (EPAP) device. This device has valves that you put into each nostril. ? A bi-level positive airway pressure (BPAP) device. This device blows air through a mask when you breathe in (inhale) and breathe out (exhale).  Having  surgery if other treatments do not work. During surgery, excess tissue is removed to create a wider airway. It is important to get treatment for sleep apnea. Without treatment, this condition can lead to:  High blood pressure.  Coronary artery disease.  In men, an inability to achieve or maintain an erection (impotence).  Reduced thinking abilities. Follow these instructions at home: Lifestyle  Make any lifestyle changes that your health care provider recommends.  Eat a healthy, well-balanced diet.  Take steps to lose weight if you are overweight.  Avoid using depressants, including alcohol, sedatives, and narcotics.  Do not use any products that contain nicotine or tobacco, such as cigarettes, e-cigarettes, and chewing tobacco. If you need help quitting, ask your health care provider. General instructions  Take over-the-counter and prescription medicines only as told by your health care provider.  If you were given a device to open your airway while you sleep, use it only as told by your health care provider.  If you are having surgery, make sure to tell your health care provider you have sleep apnea. You may need to bring your device with you.  Keep all follow-up visits as told by your health care provider. This is important. Contact a health care provider if:  The device that you received to open your airway during sleep is uncomfortable or does not seem to be working.  Your symptoms do not improve.  Your symptoms get worse. Get help right away if:  You develop: ? Chest pain. ? Shortness of breath. ? Discomfort in your back, arms, or stomach.  You have: ? Trouble speaking. ? Weakness on one side of your body. ? Drooping in your face. These symptoms may represent a serious problem that is an emergency. Do not wait to see if the symptoms will go away. Get medical help right away. Call your local emergency services (911 in the [place] Do not drive yourself to the  hospital. Summary  Sleep apnea is a condition in which breathing pauses or becomes shallow during sleep.  The most common cause is a collapsed or blocked airway.  The goal of treatment is to restore normal breathing and to ease symptoms during sleep. This information is not intended to replace advice given to you by your health care provider. Make sure you discuss any questions you have with your health care provider. Document Revised: 08/30/2018 Document Reviewed: 11/08/2017 Elsevier Patient Education  [place].

## 2019-05-30 ENCOUNTER — Telehealth: Payer: Self-pay | Admitting: Nurse Practitioner

## 2019-05-30 LAB — LIPID PANEL
Cholesterol: 157 mg/dL (ref 0–200)
HDL: 50.5 mg/dL (ref >39.00)
NonHDL: 106.31
Total CHOL/HDL Ratio: 3
Triglycerides: 243 mg/dL — ABNORMAL HIGH (ref 0.0–149.0)
VLDL: 48.6 mg/dL — ABNORMAL HIGH (ref 0.0–40.0)

## 2019-05-30 LAB — CBC
HCT: 42 % (ref 36.0–46.0)
Hemoglobin: 13.9 g/dL (ref 12.0–15.0)
MCHC: 33 g/dL (ref 30.0–36.0)
MCV: 84.9 fl (ref 78.0–100.0)
Platelets: 459 10*3/uL — ABNORMAL HIGH (ref 150.0–400.0)
RBC: 4.95 Mil/uL (ref 3.87–5.11)
RDW: 15 % (ref 11.5–15.5)
WBC: 10.8 10*3/uL — ABNORMAL HIGH (ref 4.0–10.5)

## 2019-05-30 LAB — BASIC METABOLIC PANEL
BUN: 18 mg/dL (ref 6–23)
CO2: 27 mEq/L (ref 19–32)
Calcium: 9.7 mg/dL (ref 8.4–10.5)
Chloride: 103 mEq/L (ref 96–112)
Creatinine, Ser: 0.73 mg/dL (ref 0.40–1.20)
GFR: 81.26 mL/min (ref >60.00)
Glucose, Bld: 77 mg/dL (ref 70–99)
Potassium: 4.3 mEq/L (ref 3.5–5.1)
Sodium: 140 mEq/L (ref 135–145)

## 2019-05-30 LAB — HEPATIC FUNCTION PANEL
ALT: 20 U/L (ref 0–35)
AST: 16 U/L (ref 0–37)
Albumin: 4.1 g/dL (ref 3.5–5.2)
Alkaline Phosphatase: 80 U/L (ref 39–117)
Bilirubin, Direct: 0 mg/dL (ref 0.0–0.3)
Total Bilirubin: 0.2 mg/dL (ref 0.2–1.2)
Total Protein: 7.3 g/dL (ref 6.0–8.3)

## 2019-05-30 LAB — MICROALBUMIN / CREATININE URINE RATIO
Creatinine,U: 59.7 mg/dL
Microalb Creat Ratio: 1.2 mg/g (ref 0.0–30.0)
Microalb, Ur: <0.7 mg/dL (ref 0.0–1.9)

## 2019-05-30 LAB — TSH: TSH: 1.47 u[IU]/mL (ref 0.35–4.50)

## 2019-05-30 LAB — LDL CHOLESTEROL, DIRECT: Direct LDL: 75 mg/dL

## 2019-05-30 LAB — HEMOGLOBIN A1C: Hgb A1c MFr Bld: 7.4 % — ABNORMAL HIGH (ref 4.6–6.5)

## 2019-05-30 NOTE — Telephone Encounter (Signed)
Please call her and let her know the blood work is improving and to stay on her current medications. The triglycerides are still elevated- that can be lowered with healthy diet, less carbs and sugar, as she is doing. Exercise as she is able. We can discuss at next office visit.

## 2019-05-31 NOTE — Telephone Encounter (Signed)
Called Pt with results Pt stated she understood with No questions.

## 2019-06-03 ENCOUNTER — Other Ambulatory Visit: Payer: Self-pay | Admitting: Family Medicine

## 2019-06-03 DIAGNOSIS — R0981 Nasal congestion: Secondary | ICD-10-CM

## 2019-06-04 ENCOUNTER — Other Ambulatory Visit: Payer: Self-pay | Admitting: Primary Care

## 2019-06-04 DIAGNOSIS — E785 Hyperlipidemia, unspecified: Secondary | ICD-10-CM

## 2019-06-04 DIAGNOSIS — E119 Type 2 diabetes mellitus without complications: Secondary | ICD-10-CM

## 2019-06-18 ENCOUNTER — Other Ambulatory Visit: Payer: Self-pay | Admitting: Primary Care

## 2019-06-18 DIAGNOSIS — E119 Type 2 diabetes mellitus without complications: Secondary | ICD-10-CM

## 2019-06-18 NOTE — Telephone Encounter (Signed)
KM-Plz see refill req/thx dmf

## 2019-06-21 ENCOUNTER — Telehealth: Payer: Self-pay | Admitting: Nurse Practitioner

## 2019-06-21 NOTE — Telephone Encounter (Signed)
Pt said she has a history of anxiety. She said lately she is having some issues and she needs to go back on medication. She said she was on Effexor and if she needs to do an appt before her appt on 07/02/19 that is fine.

## 2019-06-22 NOTE — Telephone Encounter (Signed)
Patient says she can wait to see provider that she just under some stress right now. Denies being crisis refused [facility] services.

## 2019-06-22 NOTE — Telephone Encounter (Addendum)
She took Effexor 37.5 mg twice daily years ago. It works better for depression than anxiety. It is one of the medication that is harder to stop. It looks like she has been off of it for years.   1. We can discuss in 2 weeks at the office visit. [facility] is open M-W- F from 8-3 for walk- in if she needs assistance sooner.

## 2019-06-26 ENCOUNTER — Other Ambulatory Visit: Payer: Self-pay | Admitting: Primary Care

## 2019-06-26 DIAGNOSIS — E119 Type 2 diabetes mellitus without complications: Secondary | ICD-10-CM

## 2019-06-28 ENCOUNTER — Telehealth: Payer: Self-pay | Admitting: Nurse Practitioner

## 2019-06-28 DIAGNOSIS — E119 Type 2 diabetes mellitus without complications: Secondary | ICD-10-CM

## 2019-06-28 MED ORDER — METFORMIN HCL 1000 MG PO TABS: ORAL_TABLET | ORAL | 3 refills | Status: DC

## 2019-06-28 MED ORDER — GLIPIZIDE 10 MG PO TABS: ORAL_TABLET | ORAL | 3 refills | Status: DC

## 2019-06-28 MED ORDER — TRULICITY 0.75 MG/0.5ML ~~LOC~~ SOAJ: SUBCUTANEOUS | 1 refills | Status: DC

## 2019-06-28 NOTE — Telephone Encounter (Signed)
Pt needs refills on the following sent to [facility]     Dulaglutide (TRULICITY) A999333 0000000 SOPN  metFORMIN (GLUCOPHAGE) 1000 MG tablet  rosuvastatin (CRESTOR) 5 MG tablet

## 2019-06-28 NOTE — Addendum Note (Signed)
Addended by: Elpidio Galea T on: 06/28/2019 11:24 AM   Modules accepted: Orders

## 2019-07-02 ENCOUNTER — Other Ambulatory Visit: Payer: Self-pay

## 2019-07-02 ENCOUNTER — Ambulatory Visit: Payer: 59 | Admitting: Nurse Practitioner

## 2019-07-02 VITALS — BP 116/70 | HR 102 | Temp 97.1°F | Resp 16 | Ht 67.0 in | Wt 192.0 lb

## 2019-07-02 DIAGNOSIS — D473 Essential (hemorrhagic) thrombocythemia: Secondary | ICD-10-CM

## 2019-07-02 DIAGNOSIS — E119 Type 2 diabetes mellitus without complications: Secondary | ICD-10-CM

## 2019-07-02 DIAGNOSIS — D75839 Thrombocytosis, unspecified: Secondary | ICD-10-CM

## 2019-07-02 DIAGNOSIS — E785 Hyperlipidemia, unspecified: Secondary | ICD-10-CM

## 2019-07-02 MED ORDER — ROSUVASTATIN CALCIUM 10 MG PO TABS: 10.0000 mg | ORAL_TABLET | Freq: Every day | ORAL | 0 refills | Status: DC

## 2019-07-02 NOTE — Progress Notes (Signed)
Established Patient Office Visit  Subjective:  Patient ID: Whitney [name], female    DOB: 20-Oct-1960  Age: 59 y.o. MRN: [id number]  CC:  Chief Complaint  Patient presents with  . Follow-up    HPI Whitney [name] presents for a follow-up visit.  DM type II: On Trulicity A999333 [id number] mL weekly, Metformin 1000 mg twice daily, and glipizide 10 mg daily.  BS 135-145 in mid morning. She has not been walking secondary to plantar fascitis. She has  an elliptical is better.  She has been on a weight loss plan, recently strayed and weight has remained stable.  She has experienced no GI side effects from the Metformin.  She has had no hypoglycemia from glipizide.  Patient has no difficulty giving herself Trulicity injections.  She finds the medication very helpful. A1C  is improving and will continue with medication and lifestyle/diet.   Lab Results  Component Value Date   HGBA1C 7.4 (H) 05/29/2019   Lab Results  Component Value Date   MICROALBUR <0.7 05/29/2019   MICROALBUR 1.1 03/31/2018    Wt Readings from Last 3 Encounters:  07/02/19 192 lb (87.1 kg)  05/29/19 191 lb 12.8 oz (87 kg)  12/06/18 200 lb 8 oz (90.9 kg)   Anxiety: She reports anxiety problems in the past.  She had taken Effexor for many years and decided that CBD oil seem to work very well.  She recalls that Zoloft made her jittery and even more nervous.  She came off the CBD oil 1 year ago.    She is having situational stress, and had a few panic attacks again.  She thought maybe she wanted to go back on Effexor.  However, she bought CBD oil and has been taking that for the last 4 days and it seems to have eased all of her anxiety. She is no longer feeling overwhelmed with family situation. No further panic attacks. Now she is sleeping well. She does not feel depressed. She talked with her pastor for counseling and found that helpful.    HLD: taking Crestor 5 mg daily and Fish oil . No muscle cramps or medication side  effects.  Lab Results  Component Value Date   CHOL 157 05/29/2019   HDL 50.50 05/29/2019   LDLCALC 58 09/19/2018   LDLDIRECT 75.0 05/29/2019   TRIG 243.0 (H) 05/29/2019   CHOLHDL 3 05/29/2019    Rising thrombocytosis: Slight elevated PLT and now >450.  WBC 10.8-11 range. No hx of splenectomy. FH negative for malignancy.   Lab Results  Component Value Date   WBC 10.8 (H) 05/29/2019   HGB 13.9 05/29/2019   HCT 42.0 05/29/2019   MCV 84.9 05/29/2019   PLT 459.0 (H) 05/29/2019    Health maintenance: She  is panning on getting GYN order PAP and mammogram.  Eye exam: Due  Dentist: Due Colonoscopy: done- check with Dr. Ricky Stabs office for repeat   Past Medical History:  Diagnosis Date  . Allergy    Seasonal  . Anxiety   . Attention deficit disorder without mention of hyperactivity   . Barrett's esophagus   . CHOLELITHIASIS 05/26/2007   Qualifier: Diagnosis of  By: Maxie Better FNP, Rosalita Levan   . Congenital anomalies of foot, not elsewhere classified   . CONSTIPATION 10/09/2008   Qualifier: Diagnosis of  By: Maxie Better FNP, Rosalita Levan   . Depression   . Diabetes mellitus without complication ([place])   . Displacement of intervertebral disc, site unspecified, without myelopathy   .  DIVERTICULAR DISEASE 02/06/2002   Qualifier: Diagnosis of  By: Maxie Better FNP, Rosalita Levan   . Diverticulosis   . Elevated blood pressure reading without diagnosis of hypertension   . Esophageal reflux   . Gallstones   . Gastroenteritis 07/27/2016  . Hiatal hernia   . HIATAL HERNIA 10/16/2008   Qualifier: Diagnosis of  By: Maxie Better FNP, Rosalita Levan   . HLD (hyperlipidemia)   . Hypoglycemia, unspecified   . Internal hemorrhoids without mention of complication   . Other abnormal blood chemistry     Past Surgical History:  Procedure Laterality Date  . BREAST CYST EXCISION Left   . CARDIAC CATHETERIZATION     [facility]  . CESAREAN SECTION    . CHOLECYSTECTOMY  06/16/2007   Dr. Hulen Skains  .  COLONOSCOPY  02/06/2002   diverticula and internal hemorroids  . HIATAL HERNIA REPAIR    . LUMBAR LAMINECTOMY     L5-S1    Family History  Problem Relation Age of Onset  . Colon polyps Mother   . Hypertension Father   . Diabetes Father   . Heart disease Father   . Colon polyps Maternal Grandmother   . Heart attack Maternal Grandmother   . Heart attack Paternal Grandfather   . Diabetes Maternal Aunt   . Prostate cancer Maternal Grandfather   . Colon cancer Paternal Grandmother     Social History   Socioeconomic History  . Marital status: Married    Spouse name: Not on file  . Number of children: 2  . Years of education: Not on file  . Highest education level: Not on file  Occupational History  . Occupation: Self employeed- Conservation officer, historic buildings  Tobacco Use  . Smoking status: Former Smoker    Types: Cigarettes  . Smokeless tobacco: Never Used  . Tobacco comment: 2 years as a teenager  Substance and Sexual Activity  . Alcohol use: Not Currently    Alcohol/week: 0.0 standard drinks  . Drug use: No  . Sexual activity: Yes  Other Topics Concern  . Not on file  Social History Narrative  . Not on file   Social Determinants of Health   Financial Resource Strain:   . Difficulty of Paying Living Expenses:   Food Insecurity:   . Worried About Charity fundraiser in the Last Year:   . Arboriculturist in the Last Year:   Transportation Needs:   . Film/video editor (Medical):   Marland Kitchen Lack of Transportation (Non-Medical):   Physical Activity:   . Days of Exercise per Week:   . Minutes of Exercise per Session:   Stress:   . Feeling of Stress :   Social Connections:   . Frequency of Communication with Friends and Family:   . Frequency of Social Gatherings with Friends and Family:   . Attends Religious Services:   . Active Member of Clubs or Organizations:   . Attends Archivist Meetings:   Marland Kitchen Marital Status:   Intimate Partner Violence:   . Fear of Current or  Ex-Partner:   . Emotionally Abused:   Marland Kitchen Physically Abused:   . Sexually Abused:     Outpatient Medications Prior to Visit  Medication Sig Dispense Refill  . Aspirin-Salicylamide-Caffeine (BC HEADACHE POWDER PO) Take 1 packet by mouth as needed (headache).    . cyclobenzaprine (FLEXERIL) 5 MG tablet TAKE 1 TABLET (5 MG TOTAL) BY MOUTH 3 (THREE) TIMES DAILY AS NEEDED FOR MUSCLE SPASMS. 30 tablet 1  .  Dulaglutide (TRULICITY) A999333 0000000 SOPN Inject into the skin once weekly for diabetes. 6 mL 1  . Esomeprazole Magnesium (NEXIUM 24HR) 20 MG TBEC Take 40 capsules by mouth at bedtime.     . fluticasone (FLONASE) 50 MCG/ACT nasal spray Place 2 sprays into both nostrils daily. (Patient taking differently: Place 2 sprays into both nostrils as needed for allergies. ) 16 g 6  . glipiZIDE (GLUCOTROL) 10 MG tablet Take one tab po daily 60 tablet 3  . metFORMIN (GLUCOPHAGE) 1000 MG tablet TAKE 1 TABLET BY MOUTH TWICE DAILY WITH A MEAL FOR DIABETES. 180 tablet 3  . Omega-3 Fatty Acids (FISH OIL) 1200 MG CPDR Take 1,200 mg by mouth daily.     Marland Kitchen triamcinolone cream (KENALOG) 0.1 % APPLY 1 APPLICATION TOPICALLY 2 (TWO) TIMES DAILY. 30 g 1  . rosuvastatin (CRESTOR) 5 MG tablet TAKE 1 TABLET BY MOUTH EVERY DAY IN THE EVENING FOR CHOLESTEROL (Patient taking differently: Take 5 mg by mouth daily at 6 PM. ) 90 tablet 3   No facility-administered medications prior to visit.    Allergies  Allergen Reactions  . Sertraline Hcl     REACTION: Headache/ vision changes  . Zoloft [Sertraline Hcl]     Same as Sertraline    Review of Systems  Constitutional: Negative.   HENT: Negative.   Eyes: Negative.   Respiratory: Negative.   Cardiovascular: Negative.   Gastrointestinal: Negative.   Genitourinary: Negative.   Musculoskeletal: Negative.   Allergic/Immunologic: Negative.   Neurological: Negative.   Psychiatric/Behavioral:       See HPI. No SI or HI.       Objective:    Physical Exam   Constitutional: She is oriented to person, place, and time. She appears well-developed and well-nourished.  HENT:  Head: Normocephalic and atraumatic.  Eyes: Pupils are equal, round, and reactive to light.  Cardiovascular: Regular rhythm.  No murmur heard. Pulmonary/Chest: Effort normal and breath sounds normal. No respiratory distress.  Abdominal: Soft. There is no abdominal tenderness.  Musculoskeletal:        General: Normal range of motion.     Cervical back: Normal range of motion and neck supple.  Neurological: She is alert and oriented to person, place, and time.  Skin: Skin is warm and dry.  Psychiatric: She has a normal mood and affect. Her behavior is normal. Thought content normal.    BP 116/70   Pulse (!) 102   Temp (!) 97.1 F (36.2 C)   Resp 16   Ht 5\' 7"  (1.702 m)   Wt 192 lb (87.1 kg)   LMP 06/17/2011   SpO2 98%   BMI 30.07 kg/m  Wt Readings from Last 3 Encounters:  07/02/19 192 lb (87.1 kg)  05/29/19 191 lb 12.8 oz (87 kg)  12/06/18 200 lb 8 oz (90.9 kg)     Health Maintenance Due  Topic Date Due  . HIV Screening  Never done  . PAP SMEAR-Modifier  11/27/2008  . MAMMOGRAM  02/20/2014  . OPHTHALMOLOGY EXAM  06/11/2018    There are no preventive care reminders to display for this patient.  Lab Results  Component Value Date   TSH 1.47 05/29/2019   Lab Results  Component Value Date   WBC 10.8 (H) 05/29/2019   HGB 13.9 05/29/2019   HCT 42.0 05/29/2019   MCV 84.9 05/29/2019   PLT 459.0 (H) 05/29/2019   Lab Results  Component Value Date   NA 140 05/29/2019   K 4.3 05/29/2019  CO2 27 05/29/2019   GLUCOSE 77 05/29/2019   BUN 18 05/29/2019   CREATININE 0.73 05/29/2019   BILITOT 0.2 05/29/2019   ALKPHOS 80 05/29/2019   AST 16 05/29/2019   ALT 20 05/29/2019   PROT 7.3 05/29/2019   ALBUMIN 4.1 05/29/2019   CALCIUM 9.7 05/29/2019   ANIONGAP 11 08/17/2018   GFR 81.26 05/29/2019   Lab Results  Component Value Date   CHOL 157 05/29/2019    Lab Results  Component Value Date   HDL 50.50 05/29/2019   Lab Results  Component Value Date   LDLCALC 58 09/19/2018   Lab Results  Component Value Date   TRIG 243.0 (H) 05/29/2019   Lab Results  Component Value Date   CHOLHDL 3 05/29/2019   Lab Results  Component Value Date   HGBA1C 7.4 (H) 05/29/2019      Assessment & Plan:   Problem List Items Addressed This Visit      Endocrine   Type 2 diabetes mellitus ([place]) - Primary   Relevant Medications   rosuvastatin (CRESTOR) 10 MG tablet     Other   HLD (hyperlipidemia)   Relevant Medications   rosuvastatin (CRESTOR) 10 MG tablet    Other Visit Diagnoses    Thrombocytosis ([place])       Relevant Orders   Ambulatory referral to Hematology      Meds ordered this encounter  Medications  . rosuvastatin (CRESTOR) 10 MG tablet    Sig: Take 1 tablet (10 mg total) by mouth daily.    Dispense:  90 tablet    Refill:  0    Order Specific Question:   Supervising Provider    Answer:   Einar Pheasant [id number]   Please check fasting blood sugar first thing in the morning and then 2 hours after supper. Send in your readings via My Chart.   We can discuss anxiety medicine if you need it.   Please contact your GYN get your PAP and mammogram.  You need an eye exam, please schedule this.   Please contact Dr. Ricky Stabs office and ask if you need a repeat colonoscopy.   Please increase your Crestor to 10 mg daily.   Follow up office visit in 8 weeks and will check lipid on higher dose of Crestor.   Thrombocytosis is defined as a platelet count >450,000/microL (>450 x 109/L) in adults and children. Referral to Hematology.   Follow-up: Return in about 2 months (around 09/01/2019).    Denice Paradise, NP

## 2019-07-02 NOTE — Patient Instructions (Addendum)
Please check fasting blood sugar first thing in the morning and then 2 hours after supper. Send in your readings via My Chart.   We can discuss anxiety medicine if you need it.   Please contact your GYN get your PAP and mammogram.  You need an eye exam, please schedule this.   Please contact Dr. Ricky Stabs office and ask if you need a repeat colonoscopy.   Please increase your Crestor to 10 mg daily.   Follow up office visit in 8 weeks and will check lipid on higher dose of Crestor.

## 2019-07-03 ENCOUNTER — Encounter: Payer: Self-pay | Admitting: Nurse Practitioner

## 2019-07-04 ENCOUNTER — Telehealth: Payer: Self-pay | Admitting: Nurse Practitioner

## 2019-07-04 NOTE — Telephone Encounter (Signed)
Pt wanted PCP to know that sugar was 93 two hours after supper last night and 160 this am before food.

## 2019-07-06 ENCOUNTER — Inpatient Hospital Stay: Payer: 59

## 2019-07-06 ENCOUNTER — Encounter: Payer: Self-pay | Admitting: Oncology

## 2019-07-06 ENCOUNTER — Inpatient Hospital Stay: Payer: 59 | Attending: Oncology | Admitting: Oncology

## 2019-07-06 ENCOUNTER — Other Ambulatory Visit: Payer: Self-pay

## 2019-07-06 VITALS — BP 98/67 | HR 102 | Temp 97.2°F | Resp 20 | Wt 191.7 lb

## 2019-07-06 DIAGNOSIS — Z87891 Personal history of nicotine dependence: Secondary | ICD-10-CM | POA: Insufficient documentation

## 2019-07-06 DIAGNOSIS — D72829 Elevated white blood cell count, unspecified: Secondary | ICD-10-CM | POA: Diagnosis not present

## 2019-07-06 DIAGNOSIS — E119 Type 2 diabetes mellitus without complications: Secondary | ICD-10-CM | POA: Insufficient documentation

## 2019-07-06 DIAGNOSIS — F418 Other specified anxiety disorders: Secondary | ICD-10-CM | POA: Diagnosis not present

## 2019-07-06 DIAGNOSIS — Z79899 Other long term (current) drug therapy: Secondary | ICD-10-CM | POA: Insufficient documentation

## 2019-07-06 DIAGNOSIS — D509 Iron deficiency anemia, unspecified: Secondary | ICD-10-CM | POA: Insufficient documentation

## 2019-07-06 DIAGNOSIS — Z8719 Personal history of other diseases of the digestive system: Secondary | ICD-10-CM | POA: Diagnosis not present

## 2019-07-06 DIAGNOSIS — D751 Secondary polycythemia: Secondary | ICD-10-CM | POA: Diagnosis not present

## 2019-07-06 DIAGNOSIS — D75839 Thrombocytosis, unspecified: Secondary | ICD-10-CM

## 2019-07-06 DIAGNOSIS — Z7984 Long term (current) use of oral hypoglycemic drugs: Secondary | ICD-10-CM | POA: Diagnosis not present

## 2019-07-06 DIAGNOSIS — E785 Hyperlipidemia, unspecified: Secondary | ICD-10-CM | POA: Diagnosis not present

## 2019-07-06 DIAGNOSIS — K219 Gastro-esophageal reflux disease without esophagitis: Secondary | ICD-10-CM | POA: Diagnosis not present

## 2019-07-06 DIAGNOSIS — D473 Essential (hemorrhagic) thrombocythemia: Secondary | ICD-10-CM | POA: Diagnosis not present

## 2019-07-06 DIAGNOSIS — Z800 Family history of malignant neoplasm of digestive organs: Secondary | ICD-10-CM | POA: Diagnosis not present

## 2019-07-06 DIAGNOSIS — K449 Diaphragmatic hernia without obstruction or gangrene: Secondary | ICD-10-CM | POA: Diagnosis not present

## 2019-07-06 LAB — CBC WITH DIFFERENTIAL/PLATELET
Abs Immature Granulocytes: 0.03 10*3/uL (ref 0.00–0.07)
Basophils Absolute: 0.1 10*3/uL (ref 0.0–0.1)
Basophils Relative: 1 %
Eosinophils Absolute: 0.1 10*3/uL (ref 0.0–0.5)
Eosinophils Relative: 1 %
HCT: 42.4 % (ref 36.0–46.0)
Hemoglobin: 13.7 g/dL (ref 12.0–15.0)
Immature Granulocytes: 0 %
Lymphocytes Relative: 11 %
Lymphs Abs: 1.3 10*3/uL (ref 0.7–4.0)
MCH: 27.5 pg (ref 26.0–34.0)
MCHC: 32.3 g/dL (ref 30.0–36.0)
MCV: 85 fL (ref 80.0–100.0)
Monocytes Absolute: 0.6 10*3/uL (ref 0.1–1.0)
Monocytes Relative: 6 %
Neutro Abs: 9.1 10*3/uL — ABNORMAL HIGH (ref 1.7–7.7)
Neutrophils Relative %: 81 %
Platelets: 366 10*3/uL (ref 150–400)
RBC: 4.99 MIL/uL (ref 3.87–5.11)
RDW: 14.6 % (ref 11.5–15.5)
WBC: 11.1 10*3/uL — ABNORMAL HIGH (ref 4.0–10.5)
nRBC: 0 % (ref 0.0–0.2)

## 2019-07-06 LAB — IRON AND TIBC
Iron: 69 ug/dL (ref 28–170)
Saturation Ratios: 14 % (ref 10.4–31.8)
TIBC: 511 ug/dL — ABNORMAL HIGH (ref 250–450)
UIBC: 442 ug/dL

## 2019-07-06 LAB — LACTATE DEHYDROGENASE: LDH: 121 U/L (ref 98–192)

## 2019-07-06 LAB — FERRITIN: Ferritin: 10 ng/mL — ABNORMAL LOW (ref 11–307)

## 2019-07-06 NOTE — Progress Notes (Signed)
Patient reports no concerns today other than feeling lousy due to receiving last COVID shot yesterday. She denies any pain.

## 2019-07-06 NOTE — Progress Notes (Signed)
[place]  Telephone:([phone]) [phone] Fax:(336) [phone]  ID: Whitney [name] OB: 1960-06-12  MR#: [id number]  TGP#:[id number]  Patient Care Team: Marval Regal, NP as PCP - General (Nurse Practitioner)  CHIEF COMPLAINT: Thrombocytosis.  INTERVAL HISTORY: Patient is a 59 year old female who was last evaluated in clinic greater than 4 years ago for iron deficiency anemia.  She is referred back after noting platelet count slowly increasing over the past several months.  Currently feels well and is asymptomatic.  She has no neurologic complaints.  She denies any recent fevers or illnesses.  She has a good appetite and denies weight loss.  She has no chest pain, shortness of breath, cough, or hemoptysis.  She denies any nausea, vomiting, constipation, or diarrhea.  She has no urinary complaints.  Patient feels at her baseline and offers no specific complaints today.  REVIEW OF SYSTEMS:   Review of Systems  Constitutional: Negative.  Negative for fever, malaise/fatigue and weight loss.  Respiratory: Negative.  Negative for cough, hemoptysis and shortness of breath.   Cardiovascular: Negative.  Negative for chest pain and leg swelling.  Gastrointestinal: Negative.  Negative for abdominal pain.  Genitourinary: Negative.  Negative for dysuria and hematuria.  Musculoskeletal: Negative.  Negative for back pain.  Skin: Negative.  Negative for rash.  Neurological: Negative.  Negative for dizziness, seizures, weakness and headaches.    As per HPI. Otherwise, a complete review of systems is negative.  PAST MEDICAL HISTORY: Past Medical History:  Diagnosis Date  . Allergy    Seasonal  . Anxiety   . Attention deficit disorder without mention of hyperactivity   . Barrett's esophagus   . CHOLELITHIASIS 05/26/2007   Qualifier: Diagnosis of  By: Maxie Better FNP, Rosalita Levan   . Congenital anomalies of foot, not elsewhere classified   . CONSTIPATION 10/09/2008   Qualifier:  Diagnosis of  By: Maxie Better FNP, Rosalita Levan   . Depression   . Diabetes mellitus without complication ([place])   . Displacement of intervertebral disc, site unspecified, without myelopathy   . DIVERTICULAR DISEASE 02/06/2002   Qualifier: Diagnosis of  By: Maxie Better FNP, Rosalita Levan   . Diverticulosis   . Elevated blood pressure reading without diagnosis of hypertension   . Esophageal reflux   . Gallstones   . Gastroenteritis 07/27/2016  . Hiatal hernia   . HIATAL HERNIA 10/16/2008   Qualifier: Diagnosis of  By: Maxie Better FNP, Rosalita Levan   . HLD (hyperlipidemia)   . Hypoglycemia, unspecified   . Internal hemorrhoids without mention of complication   . Other abnormal blood chemistry     PAST SURGICAL HISTORY: Past Surgical History:  Procedure Laterality Date  . BREAST CYST EXCISION Left   . CARDIAC CATHETERIZATION     [facility]  . CESAREAN SECTION    . CHOLECYSTECTOMY  06/16/2007   Dr. Hulen Skains  . COLONOSCOPY  02/06/2002   diverticula and internal hemorroids  . HIATAL HERNIA REPAIR    . LUMBAR LAMINECTOMY     L5-S1    FAMILY HISTORY: Family History  Problem Relation Age of Onset  . Colon polyps Mother   . Hypertension Father   . Diabetes Father   . Heart disease Father   . Colon polyps Maternal Grandmother   . Heart attack Maternal Grandmother   . Heart attack Paternal Grandfather   . Diabetes Maternal Aunt   . Prostate cancer Maternal Grandfather   . Colon cancer Paternal Grandmother     ADVANCED DIRECTIVES (Y/N):  N  HEALTH MAINTENANCE: Social History   Tobacco Use  . Smoking status: Former Smoker    Types: Cigarettes  . Smokeless tobacco: Never Used  . Tobacco comment: 2 years as a teenager  Substance Use Topics  . Alcohol use: Not Currently    Alcohol/week: 0.0 standard drinks  . Drug use: No     Colonoscopy:  PAP:  Bone density:  Lipid panel:  Allergies  Allergen Reactions  . Sertraline Hcl     REACTION: Headache/ vision changes  . Zoloft  [Sertraline Hcl]     Same as Sertraline    Current Outpatient Medications  Medication Sig Dispense Refill  . Aspirin-Salicylamide-Caffeine (BC HEADACHE POWDER PO) Take 1 packet by mouth as needed (headache).    . cyclobenzaprine (FLEXERIL) 5 MG tablet TAKE 1 TABLET (5 MG TOTAL) BY MOUTH 3 (THREE) TIMES DAILY AS NEEDED FOR MUSCLE SPASMS. 30 tablet 1  . Dulaglutide (TRULICITY) 0.98 JX/9.1YN SOPN Inject into the skin once weekly for diabetes. 6 mL 1  . Esomeprazole Magnesium (NEXIUM 24HR) 20 MG TBEC Take 40 capsules by mouth at bedtime.     . fluticasone (FLONASE) 50 MCG/ACT nasal spray Place 2 sprays into both nostrils daily. (Patient taking differently: Place 2 sprays into both nostrils as needed for allergies. ) 16 g 6  . glipiZIDE (GLUCOTROL) 10 MG tablet Take one tab po daily 60 tablet 3  . metFORMIN (GLUCOPHAGE) 1000 MG tablet TAKE 1 TABLET BY MOUTH TWICE DAILY WITH A MEAL FOR DIABETES. 180 tablet 3  . Omega-3 Fatty Acids (FISH OIL) 1200 MG CPDR Take 1,200 mg by mouth daily.     . rosuvastatin (CRESTOR) 10 MG tablet Take 1 tablet (10 mg total) by mouth daily. 90 tablet 0  . triamcinolone cream (KENALOG) 0.1 % APPLY 1 APPLICATION TOPICALLY 2 (TWO) TIMES DAILY. 30 g 1   No current facility-administered medications for this visit.    OBJECTIVE: Vitals:   07/06/19 1131  BP: 98/67  Pulse: (!) 102  Resp: 20  Temp: (!) 97.2 F (36.2 C)  SpO2: 98%     Body mass index is 30.02 kg/m.    ECOG FS:0 - Asymptomatic  General: Well-developed, well-nourished, no acute distress. Eyes: Pink conjunctiva, anicteric sclera. HEENT: Normocephalic, moist mucous membranes. Lungs: No audible wheezing or coughing. Heart: Regular rate and rhythm. Abdomen: Soft, nontender, no obvious distention. Musculoskeletal: No edema, cyanosis, or clubbing. Neuro: Alert, answering all questions appropriately. Cranial nerves grossly intact. Skin: No rashes or petechiae noted. Psych: Normal affect. Lymphatics: No  cervical, calvicular, axillary or inguinal LAD.   LAB RESULTS:  Lab Results  Component Value Date   NA 140 05/29/2019   K 4.3 05/29/2019   CL 103 05/29/2019   CO2 27 05/29/2019   GLUCOSE 77 05/29/2019   BUN 18 05/29/2019   CREATININE 0.73 05/29/2019   CALCIUM 9.7 05/29/2019   PROT 7.3 05/29/2019   ALBUMIN 4.1 05/29/2019   AST 16 05/29/2019   ALT 20 05/29/2019   ALKPHOS 80 05/29/2019   BILITOT 0.2 05/29/2019   GFRNONAA >60 08/17/2018   GFRAA >60 08/17/2018    Lab Results  Component Value Date   WBC 11.1 (H) 07/06/2019   NEUTROABS 9.1 (H) 07/06/2019   HGB 13.7 07/06/2019   HCT 42.4 07/06/2019   MCV 85.0 07/06/2019   PLT 366 07/06/2019     STUDIES: No results found.  ASSESSMENT: Thrombocytosis.  PLAN:    1.  Thrombocytosis: Patient's platelet count is within normal limits today.  The  remainder of her laboratory work including iron stores and JAK2 mutation are pending at time of dictation.  No intervention is needed at this time.  Patient will have video assisted telemedicine visit in 3 weeks for further evaluation and discussion of her laboratory results. 2.  Leukocytosis: Patient also noted to have persistently elevated white blood cell count.  Have ordered peripheral blood flow cytometry as well as BCR-ABL mutation for completeness.  She does not require bone marrow biopsy.  Return to clinic as above with video assisted telemedicine visit.  I spent a total of 45 minutes reviewing chart data, face-to-face evaluation with the patient, counseling and coordination of care as detailed above.   Patient expressed understanding and was in agreement with this plan. She also understands that She can call clinic at any time with any questions, concerns, or complaints.    Lloyd Huger, MD   07/06/2019 12:43 PM

## 2019-07-10 LAB — COMP PANEL: LEUKEMIA/LYMPHOMA

## 2019-07-12 LAB — BCR-ABL1, CML/ALL, PCR, QUANT: Interpretation (BCRAL):: NEGATIVE

## 2019-07-13 LAB — JAK2 EXONS 12-15

## 2019-07-13 LAB — JAK2  V617F QUAL. WITH REFLEX TO EXON 12: Reflex:: 15

## 2019-07-21 NOTE — Progress Notes (Signed)
[place]  Telephone:(336) [phone] Fax:(336) [phone]  ID: Whitney [name] OB: [birth year]  MR#: [id number]  JIZ#:[id number]  Patient Care Team: Marval Regal, NP as PCP - General (Nurse Practitioner)  I connected with Whitney [name] on 07/27/19 at  2:15 PM EDT by video enabled telemedicine visit and verified that I am speaking with the correct person using two identifiers.   I discussed the limitations, risks, security and privacy concerns of performing an evaluation and management service by telemedicine and the availability of in-person appointments. I also discussed with the patient that there may be a patient responsible charge related to this service. The patient expressed understanding and agreed to proceed.   Other persons participating in the visit and their role in the encounter: Patient, MD.  Patient's location: Home. Provider's location: Clinic.  CHIEF COMPLAINT: Thrombocytosis, leukocytosis.  INTERVAL HISTORY: Patient agreed to video enabled telemedicine visit for further evaluation and discussion of her laboratory results.  She continues to feel well and remains asymptomatic.  She has no neurologic complaints.  She denies any recent fevers or illnesses.  She has a good appetite and denies weight loss.  She has no chest pain, shortness of breath, cough, or hemoptysis.  She denies any nausea, vomiting, constipation, or diarrhea.  She has no urinary complaints.  Patient feels at her baseline offers no specific complaints today.  REVIEW OF SYSTEMS:   Review of Systems  Constitutional: Negative.  Negative for fever, malaise/fatigue and weight loss.  Respiratory: Negative.  Negative for cough, hemoptysis and shortness of breath.   Cardiovascular: Negative.  Negative for chest pain and leg swelling.  Gastrointestinal: Negative.  Negative for abdominal pain.  Genitourinary: Negative.  Negative for dysuria and hematuria.  Musculoskeletal: Negative.  Negative for  back pain.  Skin: Negative.  Negative for rash.  Neurological: Negative.  Negative for dizziness, seizures, weakness and headaches.    As per HPI. Otherwise, a complete review of systems is negative.  PAST MEDICAL HISTORY: Past Medical History:  Diagnosis Date  . Allergy    Seasonal  . Anxiety   . Attention deficit disorder without mention of hyperactivity   . Barrett's esophagus   . CHOLELITHIASIS 05/26/2007   Qualifier: Diagnosis of  By: Maxie Better FNP, Rosalita Levan   . Congenital anomalies of foot, not elsewhere classified   . CONSTIPATION 10/09/2008   Qualifier: Diagnosis of  By: Maxie Better FNP, Rosalita Levan   . Depression   . Diabetes mellitus without complication ([place])   . Displacement of intervertebral disc, site unspecified, without myelopathy   . DIVERTICULAR DISEASE 02/06/2002   Qualifier: Diagnosis of  By: Maxie Better FNP, Rosalita Levan   . Diverticulosis   . Elevated blood pressure reading without diagnosis of hypertension   . Esophageal reflux   . Gallstones   . Gastroenteritis 07/27/2016  . Hiatal hernia   . HIATAL HERNIA 10/16/2008   Qualifier: Diagnosis of  By: Maxie Better FNP, Rosalita Levan   . HLD (hyperlipidemia)   . Hypoglycemia, unspecified   . Internal hemorrhoids without mention of complication   . Other abnormal blood chemistry     PAST SURGICAL HISTORY: Past Surgical History:  Procedure Laterality Date  . BREAST CYST EXCISION Left   . CARDIAC CATHETERIZATION     [facility]  . CESAREAN SECTION    . CHOLECYSTECTOMY  06/16/2007   Dr. Hulen Skains  . COLONOSCOPY  02/06/2002   diverticula and internal hemorroids  . HIATAL HERNIA REPAIR    . LUMBAR LAMINECTOMY  L5-S1    FAMILY HISTORY: Family History  Problem Relation Age of Onset  . Colon polyps Mother   . Hypertension Father   . Diabetes Father   . Heart disease Father   . Colon polyps Maternal Grandmother   . Heart attack Maternal Grandmother   . Heart attack Paternal Grandfather   . Diabetes  Maternal Aunt   . Prostate cancer Maternal Grandfather   . Colon cancer Paternal Grandmother     ADVANCED DIRECTIVES (Y/N):  N  HEALTH MAINTENANCE: Social History   Tobacco Use  . Smoking status: Former Smoker    Types: Cigarettes  . Smokeless tobacco: Never Used  . Tobacco comment: 2 years as a teenager  Substance Use Topics  . Alcohol use: Not Currently    Alcohol/week: 0.0 standard drinks  . Drug use: No     Colonoscopy:  PAP:  Bone density:  Lipid panel:  Allergies  Allergen Reactions  . Sertraline Hcl     REACTION: Headache/ vision changes  . Zoloft [Sertraline Hcl]     Same as Sertraline    Current Outpatient Medications  Medication Sig Dispense Refill  . Aspirin-Salicylamide-Caffeine (BC HEADACHE POWDER PO) Take 1 packet by mouth as needed (headache).    . cyclobenzaprine (FLEXERIL) 5 MG tablet TAKE 1 TABLET (5 MG TOTAL) BY MOUTH 3 (THREE) TIMES DAILY AS NEEDED FOR MUSCLE SPASMS. 30 tablet 1  . Dulaglutide (TRULICITY) 0.10 XN/2.3FT SOPN Inject into the skin once weekly for diabetes. 6 mL 1  . Esomeprazole Magnesium (NEXIUM 24HR) 20 MG TBEC Take 40 capsules by mouth at bedtime.     . fluticasone (FLONASE) 50 MCG/ACT nasal spray Place 2 sprays into both nostrils daily. (Patient taking differently: Place 2 sprays into both nostrils as needed for allergies. ) 16 g 6  . glipiZIDE (GLUCOTROL) 10 MG tablet Take one tab po daily 60 tablet 3  . metFORMIN (GLUCOPHAGE) 1000 MG tablet TAKE 1 TABLET BY MOUTH TWICE DAILY WITH A MEAL FOR DIABETES. 180 tablet 3  . Omega-3 Fatty Acids (FISH OIL) 1200 MG CPDR Take 1,200 mg by mouth daily.     . rosuvastatin (CRESTOR) 10 MG tablet Take 1 tablet (10 mg total) by mouth daily. 90 tablet 0  . triamcinolone cream (KENALOG) 0.1 % APPLY 1 APPLICATION TOPICALLY 2 (TWO) TIMES DAILY. 30 g 1   No current facility-administered medications for this visit.    OBJECTIVE: There were no vitals filed for this visit.   There is no height or  weight on file to calculate BMI.    ECOG FS:0 - Asymptomatic  General: Well-developed, well-nourished, no acute distress. HEENT: Normocephalic. Neuro: Alert, answering all questions appropriately. Cranial nerves grossly intact. Psych: Normal affect.  LAB RESULTS:  Lab Results  Component Value Date   NA 140 05/29/2019   K 4.3 05/29/2019   CL 103 05/29/2019   CO2 27 05/29/2019   GLUCOSE 77 05/29/2019   BUN 18 05/29/2019   CREATININE 0.73 05/29/2019   CALCIUM 9.7 05/29/2019   PROT 7.3 05/29/2019   ALBUMIN 4.1 05/29/2019   AST 16 05/29/2019   ALT 20 05/29/2019   ALKPHOS 80 05/29/2019   BILITOT 0.2 05/29/2019   GFRNONAA >60 08/17/2018   GFRAA >60 08/17/2018    Lab Results  Component Value Date   WBC 11.1 (H) 07/06/2019   NEUTROABS 9.1 (H) 07/06/2019   HGB 13.7 07/06/2019   HCT 42.4 07/06/2019   MCV 85.0 07/06/2019   PLT 366 07/06/2019  STUDIES: No results found.  ASSESSMENT: Thrombocytosis, leukocytosis.Marland Kitchen  PLAN:    1.  Thrombocytosis: Resolved.  Patient's most recent platelet count is 366.  Patient noted to have a mildly decreased ferritin, but the remainder of her laboratory work including JAK2 mutation is within normal limits.  No further intervention is needed.  Patient does not require bone marrow biopsy. 2.  Leukocytosis: Chronic and unchanged.  Patient's most recent white blood cell count is 11.1.  BCR-ABL and peripheral blood flow cytometry are negative.  No intervention is needed at this time. 3.  Iron deficiency: Patient noted to have a mildly decreased ferritin level.  Have recommended increasing dietary iron.  Patient may also opt to take OTC iron supplementation. 4.  Disposition: No further follow-up is necessary.  Please refer patient back if there are any questions or concerns.    I provided 20 minutes of face-to-face video visit time during this encounter which included chart review, counseling, and coordination of care as documented  above.   Patient expressed understanding and was in agreement with this plan. She also understands that She can call clinic at any time with any questions, concerns, or complaints.    Lloyd Huger, MD   07/27/2019 3:52 PM

## 2019-07-27 ENCOUNTER — Encounter: Payer: Self-pay | Admitting: Oncology

## 2019-07-27 ENCOUNTER — Inpatient Hospital Stay (HOSPITAL_BASED_OUTPATIENT_CLINIC_OR_DEPARTMENT_OTHER): Payer: 59 | Admitting: Oncology

## 2019-07-27 DIAGNOSIS — D75839 Thrombocytosis, unspecified: Secondary | ICD-10-CM

## 2019-07-27 DIAGNOSIS — D473 Essential (hemorrhagic) thrombocythemia: Secondary | ICD-10-CM

## 2019-07-27 NOTE — Progress Notes (Signed)
Patient reports no pain and no other concerns at this time.

## 2019-08-30 ENCOUNTER — Encounter: Payer: Self-pay | Admitting: Nurse Practitioner

## 2019-08-30 ENCOUNTER — Other Ambulatory Visit
Admission: RE | Admit: 2019-08-30 | Discharge: 2019-08-30 | Disposition: A | Discharge status: Home / Self Care | Payer: 59 | Source: Ambulatory Visit | Attending: *Deleted | Admitting: *Deleted

## 2019-08-30 ENCOUNTER — Ambulatory Visit: Payer: 59

## 2019-08-30 ENCOUNTER — Telehealth (INDEPENDENT_AMBULATORY_CARE_PROVIDER_SITE_OTHER): Payer: 59 | Admitting: Nurse Practitioner

## 2019-08-30 ENCOUNTER — Ambulatory Visit
Admission: RE | Admit: 2019-08-30 | Discharge: 2019-08-30 | Disposition: A | Discharge status: Home / Self Care | Payer: 59 | Source: Ambulatory Visit | Attending: Nurse Practitioner | Admitting: Nurse Practitioner

## 2019-08-30 ENCOUNTER — Telehealth: Payer: Self-pay | Admitting: Nurse Practitioner

## 2019-08-30 VITALS — Ht 67.0 in | Wt 187.0 lb

## 2019-08-30 DIAGNOSIS — R05 Cough: Secondary | ICD-10-CM

## 2019-08-30 DIAGNOSIS — J209 Acute bronchitis, unspecified: Secondary | ICD-10-CM

## 2019-08-30 DIAGNOSIS — R059 Cough, unspecified: Secondary | ICD-10-CM | POA: Insufficient documentation

## 2019-08-30 MED ORDER — BENZONATATE 100 MG PO CAPS: 100.0000 mg | ORAL_CAPSULE | Freq: Three times a day (TID) | ORAL | 0 refills | Status: AC | PRN

## 2019-08-30 MED ORDER — CEFDINIR 300 MG PO CAPS: 300.0000 mg | ORAL_CAPSULE | Freq: Two times a day (BID) | ORAL | 0 refills | Status: DC

## 2019-08-30 NOTE — Patient Instructions (Addendum)
Please obtain a Chest X-ray this eve.   Please buy a thermometer and measure for fever.  I will order an antibiotic for your cough - Omnicef and take as directed.   Mucinex DM - green label for cough at night  Tessalon Perles during the day as needed for cough.  Fluids- rest, sleep and monitor blood sugars.   Please go to Urgent Care if symptoms not improving in 48 hours  for an in-person evaluation.    Acute Bronchitis, Adult  Acute bronchitis is sudden or acute swelling of the air tubes (bronchi) in the lungs. Acute bronchitis causes these tubes to fill with mucus, which can make it hard to breathe. It can also cause coughing or wheezing. In adults, acute bronchitis usually goes away within 2 weeks. A cough caused by bronchitis may last up to 3 weeks. Smoking, allergies, and asthma can make the condition worse. What are the causes? This condition can be caused by germs and by substances that irritate the lungs, including:  Cold and flu viruses. The most common cause of this condition is the virus that causes the common cold.  Bacteria.  Substances that irritate the lungs, including: ? Smoke from cigarettes and other forms of tobacco. ? Dust and pollen. ? Fumes from chemical products, gases, or burned fuel. ? Other materials that pollute indoor or outdoor air.  Close contact with someone who has acute bronchitis. What increases the risk? The following factors may make you more likely to develop this condition:  A weak body's defense system, also called the immune system.  A condition that affects your lungs and breathing, such as asthma. What are the signs or symptoms? Common symptoms of this condition include:  Lung and breathing problems, such as: ? Coughing. This may bring up clear, yellow, or green mucus from your lungs (sputum). ? Wheezing. ? Having too much mucus in your lungs (chest congestion). ? Having shortness of breath.  A fever.  Chills.  Aches and  pains, including: ? Tightness in your chest and other body aches. ? A sore throat. How is this diagnosed? This condition is usually diagnosed based on:  Your symptoms and medical history.  A physical exam. You may also have other tests, including tests to rule out other conditions, such pneumonia. These tests include:  A test of lung function.  Test of a mucus sample to look for the presence of bacteria.  Tests to check the oxygen level in your blood.  Blood tests.  Chest X-ray. How is this treated? Most cases of acute bronchitis clear up over time without treatment. Your health care provider may recommend:  Drinking more fluids. This can thin your mucus, which may improve your breathing.  Taking a medicine for a fever or cough.  Using a device that gets medicine into your lungs (inhaler) to help improve breathing and control coughing.  Using a vaporizer or a humidifier. These are machines that add water to the air to help you breathe better. Follow these instructions at home: Activity  Get plenty of rest.  Return to your normal activities as told by your health care provider. Ask your health care provider what activities are safe for you. Lifestyle  Drink enough fluid to keep your urine pale yellow.  Do not drink alcohol.  Do not use any products that contain nicotine or tobacco, such as cigarettes, e-cigarettes, and chewing tobacco. If you need help quitting, ask your health care provider. Be aware that: ? Your bronchitis will  get worse if you smoke or breathe in other people's smoke (secondhand smoke). ? Your lungs will heal faster if you quit smoking. General instructions   Take over-the-counter and prescription medicines only as told by your health care provider.  Use an inhaler, vaporizer, or humidifier as told by your health care provider.  If you have a sore throat, gargle with a salt-water mixture 3-4 times a day or as needed. To make a salt-water mixture,  completely dissolve -1 tsp (3-6 g) of salt in 1 cup (237 mL) of warm water.  Keep all follow-up visits as told by your health care provider. This is important. How is this prevented? To lower your risk of getting this condition again:  Wash your hands often with soap and water. If soap and water are not available, use hand sanitizer.  Avoid contact with people who have cold symptoms.  Try not to touch your mouth, nose, or eyes with your hands.  Avoid places where there are fumes from chemicals. Breathing these fumes will make your condition worse.  Get the flu shot every year. Contact a health care provider if:  Your symptoms do not improve after 2 weeks of treatment.  You vomit more than once or twice.  You have symptoms of dehydration such as: ? Dark urine. ? Dry skin or eyes. ? Increased thirst. ? Headaches. ? Confusion. ? Muscle cramps. Get help right away if you:  Cough up blood.  Feel pain in your chest.  Have severe shortness of breath.  Faint or keep feeling like you are going to faint.  Have a severe headache.  Have fever or chills that get worse. These symptoms may represent a serious problem that is an emergency. Do not wait to see if the symptoms will go away. Get medical help right away. Call your local emergency services (911 in the [place] Do not drive yourself to the hospital. Summary  Acute bronchitis is sudden (acute) inflammation of the air tubes (bronchi) between the windpipe and the lungs. In adults, acute bronchitis usually goes away within 2 weeks, although coughing may last 3 weeks or longer  Take over-the-counter and prescription medicines only as told by your health care provider.  Drink enough fluid to keep your urine pale yellow.  Contact a health care provider if your symptoms do not improve after 2 weeks of treatment.  Get help right away if you cough up blood, faint, or have chest pain or shortness of breath. This information is not  intended to replace advice given to you by your health care provider. Make sure you discuss any questions you have with your health care provider. Document Revised: 11/27/2018 Document Reviewed: 10/06/2018 Elsevier Patient Education  [place].  Diabetes Mellitus and Sick Day Management Blood sugar (glucose) can be difficult to control when you are sick. Common illnesses that can cause problems for people with diabetes (diabetes mellitus) include colds, fever, flu (influenza), nausea, vomiting, and diarrhea. These illnesses can cause stress and loss of body fluids (dehydration), and those issues can cause blood glucose levels to increase. Because of this, it is very important to take your insulin and diabetes medicines and eat some form of carbohydrate when you are sick. You should make a plan for days when you are sick (sick day plan) as part of your diabetes management plan. You and your health care provider should make this plan in advance. The following guidelines are intended to help you manage an illness that lasts for  about 24 hours or less. Your health care provider may also give you more specific instructions. What do I need to do to manage my blood glucose?   Check your blood glucose every 2-4 hours, or as often as told by your health care provider.  Know your sick day treatment goals. Your target blood glucose levels may be different when you are sick.  If you use insulin, take your usual dose. ? If your blood glucose continues to be too high, you may need to take an additional insulin dose as told by your health care provider.  If you use oral diabetes medicine, you may need to stop taking it if you are not able to eat or drink normally. Ask your health care provider about whether you need to stop taking these medicines while you are sick.  If you use injectable hormone medicines other than insulin to control your diabetes, ask your health care provider about whether you need  to stop taking these medicines while you are sick. What else can I do to manage my diabetes when I am sick? Check your ketones  If you have type 1 diabetes, check your urine ketones every 4 hours.  If you have type 2 diabetes, check your urine ketones as often as told by your health care provider. Drink fluids  Drink enough fluid to keep your urine clear or pale yellow. This is especially important if you have a fever, vomiting, or diarrhea. Those symptoms can lead to dehydration.  Follow any instructions from your health care provider about beverages to avoid. ? Do not drink alcohol, caffeine, or drinks that contain a lot of sugar. Take medicines as directed  Take-over-the-counter and prescription medicines only as told by your health care provider.  Check medicine labels for added sugars. Some medicines may contain sugar or types of sugars that can raise your blood glucose level. What foods can I eat when I am sick?  You need to eat some form of carbohydrates when you are sick. You should eat 45-50 grams (45-50 g) of carbohydrates every 3-4 hours until you feel better. All of the food choices below contain about 15 g of carbohydrates. Plan ahead and keep some of these foods around so you have them if you get sick.  4-6 oz (120-177 mL) carbonated beverage that contains sugar, such as regular (not diet) soda. You may be able to drink carbonated beverages more easily if you open the beverage and let it sit at room temperature for a few minutes before drinking.   of a twin frozen ice pop.  4 oz (120 g) regular gelatin.  4 oz (120 mL) fruit juice.  4 oz (120 g) ice cream or frozen yogurt.  2 oz (60 g) sherbet.  8 oz (240 mL) clear broth or soup.  4 oz (120 g) regular custard.  4 oz (120 g) regular pudding.  8 oz (240 g) plain yogurt.  1 slice bread or toast.  6 saltine crackers.  5 vanilla wafers. Questions to ask your health care provider Consider asking the following  questions so you know what to do on days when you are sick:  Should I adjust my diabetes medicines?  How often do I need to check my blood glucose?  What supplies do I need to manage my diabetes at home when I am sick?  What number can I call if I have questions?  What foods and drinks should I avoid? Contact a health care provider if:  You develop symptoms of diabetic ketoacidosis, such as: ? Fatigue. ? Weight loss. ? Excessive thirst. ? Light-headedness. ? Fruity or sweet-smelling breath. ? Excessive urination. ? Vision changes. ? Confusion or irritability. ? Nausea. ? Vomiting. ? Rapid breathing. ? Pain in the abdomen. ? Feeling flushed.  You are unable to drink fluids without vomiting.  You have any of the following for more than 6 hours: ? Nausea. ? Vomiting. ? Diarrhea.  Your blood glucose is at or above 240 mg/dL (13.3 mmol/L), even after you take an additional insulin dose.  You have a change in how you think, feel, or act (mental status).  You develop another serious illness.  You have been sick or have had a fever for 2 days or longer and you are not getting better. Get help right away if:  Your blood glucose is lower than 54 mg/dL (3.0 mmol/L).  You have difficulty breathing.  You have moderate or high ketone levels in your urine.  You used emergency glucagon to treat low blood glucose. Summary  Blood sugar (glucose) can be difficult to control when you are sick. Common illnesses that can cause problems for people with diabetes (diabetes mellitus) include colds, fever, flu (influenza), nausea, vomiting, and diarrhea.  Illnesses can cause stress and loss of body fluids (dehydration), and those issues can cause blood glucose levels to increase.  Make a plan for days when you are sick (sick day plan) as part of your diabetes management plan. You and your health care provider should make this plan in advance.  It is very important to take your insulin  and diabetes medicines and to eat some form of carbohydrate when you are sick.  Contact your health care provider if have problems managing your blood glucose levels when you are sick, or if you have been sick or had a fever for 2 days or longer and are not getting better. This information is not intended to replace advice given to you by your health care provider. Make sure you discuss any questions you have with your health care provider. Document Revised: 12/12/2015 Document Reviewed: 12/12/2015 Elsevier Patient Education  [place].

## 2019-08-30 NOTE — Progress Notes (Signed)
Virtual Visit via Video Note  This visit type was conducted due to national recommendations for restrictions regarding the COVID-19 pandemic (e.g. social distancing).  This format is felt to be most appropriate for this patient at this time.  All issues noted in this document were discussed and addressed.  No physical exam was performed (except for noted visual exam findings with Video Visits).   I connected with@ on 09/01/19 at  4:30 PM EDT by a video enabled telemedicine application or telephone and verified that I am speaking with the correct person using two identifiers. Location patient: home Location provider: work or home office Persons participating in the virtual visit: patient, provider  I discussed the limitations, risks, security and privacy concerns of performing an evaluation and management service by telephone and the availability of in person appointments. I also discussed with the patient that there may be a patient responsible charge related to this service. The patient expressed understanding and agreed to proceed.  Reason for visit: Cough and negative Covid test   HPI: This 58 reports onset of sore throat with laryngitis on  Jul 30, 2019.  She had this for about a week and a half and then developed a productive cough over the last 2 weeks.  She has been self-medicating with Tylenol, Mucinex, DayQuil.  This past week, she has had more body aches and felt flu like, her cough was worse associated with low-grade fever she does not have a thermometer at home. Covid negative yest- [facility]. Still has the cough with productive green-yellow sputum, feels weak, some headache.  No chest tightness, chest pain, wheezing, or shortness of breath. She heard an upper airway rattling sound once when she lays down.   Non-smoker. DM- BS 175 yest and 283 after breakfast. Drinking water and last BS 173. This is her baseline 145-175 .  She works at home in [place].  ROS: See pertinent positives  and negatives per HPI. Moderna vaccine 06/07/19 and 07/05/19.  Past Medical History:  Diagnosis Date  . Allergy    Seasonal  . Anxiety   . Attention deficit disorder without mention of hyperactivity   . Barrett's esophagus   . CHOLELITHIASIS 05/26/2007   Qualifier: Diagnosis of  By: Maxie Better FNP, Rosalita Levan   . Congenital anomalies of foot, not elsewhere classified   . CONSTIPATION 10/09/2008   Qualifier: Diagnosis of  By: Maxie Better FNP, Rosalita Levan   . Depression   . Diabetes mellitus without complication ([place])   . Displacement of intervertebral disc, site unspecified, without myelopathy   . DIVERTICULAR DISEASE 02/06/2002   Qualifier: Diagnosis of  By: Maxie Better FNP, Rosalita Levan   . Diverticulosis   . Elevated blood pressure reading without diagnosis of hypertension   . Esophageal reflux   . Gallstones   . Gastroenteritis 07/27/2016  . Hiatal hernia   . HIATAL HERNIA 10/16/2008   Qualifier: Diagnosis of  By: Maxie Better FNP, Rosalita Levan   . HLD (hyperlipidemia)   . Hypoglycemia, unspecified   . Internal hemorrhoids without mention of complication   . Other abnormal blood chemistry     Past Surgical History:  Procedure Laterality Date  . BREAST CYST EXCISION Left   . CARDIAC CATHETERIZATION     [facility]  . CESAREAN SECTION    . CHOLECYSTECTOMY  06/16/2007   Dr. Hulen Skains  . COLONOSCOPY  02/06/2002   diverticula and internal hemorroids  . HIATAL HERNIA REPAIR    . LUMBAR LAMINECTOMY     L5-S1  Family History  Problem Relation Age of Onset  . Colon polyps Mother   . Hypertension Father   . Diabetes Father   . Heart disease Father   . Colon polyps Maternal Grandmother   . Heart attack Maternal Grandmother   . Heart attack Paternal Grandfather   . Diabetes Maternal Aunt   . Prostate cancer Maternal Grandfather   . Colon cancer Paternal Grandmother     SOCIAL HX: never smoked    Current Outpatient Medications:  .  Aspirin-Salicylamide-Caffeine (BC HEADACHE  POWDER PO), Take 1 packet by mouth as needed (headache)., Disp: , Rfl:  .  cyclobenzaprine (FLEXERIL) 5 MG tablet, TAKE 1 TABLET (5 MG TOTAL) BY MOUTH 3 (THREE) TIMES DAILY AS NEEDED FOR MUSCLE SPASMS., Disp: 30 tablet, Rfl: 1 .  Dulaglutide (TRULICITY) A999333 0000000 SOPN, Inject into the skin once weekly for diabetes., Disp: 6 mL, Rfl: 1 .  Esomeprazole Magnesium (NEXIUM 24HR) 20 MG TBEC, Take 40 capsules by mouth at bedtime. , Disp: , Rfl:  .  fluticasone (FLONASE) 50 MCG/ACT nasal spray, Place 2 sprays into both nostrils daily. (Patient taking differently: Place 2 sprays into both nostrils as needed for allergies. ), Disp: 16 g, Rfl: 6 .  glipiZIDE (GLUCOTROL) 10 MG tablet, Take one tab po daily, Disp: 60 tablet, Rfl: 3 .  metFORMIN (GLUCOPHAGE) 1000 MG tablet, TAKE 1 TABLET BY MOUTH TWICE DAILY WITH A MEAL FOR DIABETES., Disp: 180 tablet, Rfl: 3 .  Omega-3 Fatty Acids (FISH OIL) 1200 MG CPDR, Take 1,200 mg by mouth daily. , Disp: , Rfl:  .  rosuvastatin (CRESTOR) 10 MG tablet, Take 1 tablet (10 mg total) by mouth daily., Disp: 90 tablet, Rfl: 0 .  triamcinolone cream (KENALOG) 0.1 %, APPLY 1 APPLICATION TOPICALLY 2 (TWO) TIMES DAILY., Disp: 30 g, Rfl: 1 .  benzonatate (TESSALON PERLES) 100 MG capsule, Take 1 capsule (100 mg total) by mouth 3 (three) times daily as needed for up to 7 days for cough., Disp: 21 capsule, Rfl: 0 .  cefdinir (OMNICEF) 300 MG capsule, Take 1 capsule (300 mg total) by mouth 2 (two) times daily., Disp: 20 capsule, Rfl: 0  EXAM:  VITALS per patient if applicable:  GENERAL: alert, oriented, appears well and in no acute distress  HEENT: atraumatic, conjunctiva clear, no obvious abnormalities on inspection of external nose and ears  NECK: normal movements of the head and neck  LUNGS: on inspection no signs of respiratory distress, breathing rate appears normal, no obvious gross SOB, gasping or wheezing  CV: no obvious cyanosis  MS: moves all visible extremities  without noticeable abnormality  PSYCH/NEURO: pleasant and cooperative, no obvious depression or anxiety, speech and thought processing grossly intact  ASSESSMENT AND PLAN:  Discussed the following assessment and plan:  Bronchitis  Cough - Plan: DG Chest 2 View  No problem-specific Assessment & Plan notes found for this encounter. Please obtain a Chest X-ray this eve. Please buy a thermometer and measure for fever.I will order an antibiotic for your cough - Omnicef and take as directed. Mucinex DM - green label for cough at night- purchase over the counter Tessalon Perles during the day as needed for cough.Fluids- rest, sleep and monitor blood sugars. Please go to Urgent Care if symptoms not improving in 48 hours  for an in-person evaluation.  I discussed the assessment and treatment plan with the patient. The patient was provided an opportunity to ask questions and all were answered. The patient agreed with the plan and  demonstrated an understanding of the instructions.   The patient was advised to call back or seek an in-person evaluation if the symptoms worsen or if the condition fails to improve as anticipated.  Denice Paradise, NP Adult Nurse Practitioner [place] [phone]

## 2019-08-31 ENCOUNTER — Telehealth: Payer: Self-pay | Admitting: Nurse Practitioner

## 2019-08-31 NOTE — Telephone Encounter (Signed)
Patient called back and aware of x ray results.

## 2019-08-31 NOTE — Telephone Encounter (Signed)
I called and left message on machine to call the office.    Please tell her:  Chest x-ray returned normal lungs.  There is a small hiatal hernia.  The treatment for the hiatal hernia is heartburn prevention.  Do not eat and lay down for bed, try not to overeat, and weight loss.

## 2019-09-01 ENCOUNTER — Telehealth: Payer: Self-pay | Admitting: Nurse Practitioner

## 2019-09-01 MED ORDER — ALBUTEROL SULFATE HFA 108 (90 BASE) MCG/ACT IN AERS: 2.0000 | INHALATION_SPRAY | Freq: Four times a day (QID) | RESPIRATORY_TRACT | 0 refills | Status: DC | PRN

## 2019-09-01 MED ORDER — PREDNISONE 10 MG PO TABS
ORAL_TABLET | ORAL | 0 refills | Status: DC
Start: 2019-09-01 — End: 2019-09-14

## 2019-09-01 NOTE — Telephone Encounter (Signed)
I called Whitney [name] for a patient update. She reports feeling a lot better today.  She has much less fatigue, no body aches, and her cough is better.  No shortness of breath or chest pain.  She is noticing a little wheeze at night when she lays down.  She reports a past history of seasonal asthma.    Plan:  Begin Albuterol inhaler every 6 hours as needed Prednisone 6-5-4-3-2-1 taper. Monitor blood sugars.  Call back with update on Monday.

## 2019-09-02 NOTE — Telephone Encounter (Signed)
Duplicate entry. Disregard.

## 2019-09-04 ENCOUNTER — Ambulatory Visit: Payer: 59 | Admitting: Nurse Practitioner

## 2019-09-04 NOTE — Telephone Encounter (Signed)
Pt called back and said she is feeling better after taking prednisone and wanted to give you an update. She changed appointment from 09/07/19 to 09/14/19.

## 2019-09-07 ENCOUNTER — Ambulatory Visit: Payer: 59 | Admitting: Nurse Practitioner

## 2019-09-12 ENCOUNTER — Other Ambulatory Visit: Payer: Self-pay

## 2019-09-14 ENCOUNTER — Ambulatory Visit: Payer: 59 | Admitting: Nurse Practitioner

## 2019-09-14 ENCOUNTER — Other Ambulatory Visit: Payer: Self-pay

## 2019-09-14 ENCOUNTER — Encounter: Payer: Self-pay | Admitting: Nurse Practitioner

## 2019-09-14 VITALS — BP 122/78 | HR 94 | Temp 97.5°F | Ht 67.0 in | Wt 191.5 lb

## 2019-09-14 DIAGNOSIS — R5383 Other fatigue: Secondary | ICD-10-CM

## 2019-09-14 DIAGNOSIS — Z8719 Personal history of other diseases of the digestive system: Secondary | ICD-10-CM

## 2019-09-14 DIAGNOSIS — E663 Overweight: Secondary | ICD-10-CM

## 2019-09-14 DIAGNOSIS — E119 Type 2 diabetes mellitus without complications: Secondary | ICD-10-CM

## 2019-09-14 DIAGNOSIS — D509 Iron deficiency anemia, unspecified: Secondary | ICD-10-CM

## 2019-09-14 DIAGNOSIS — N760 Acute vaginitis: Secondary | ICD-10-CM

## 2019-09-14 DIAGNOSIS — R0981 Nasal congestion: Secondary | ICD-10-CM

## 2019-09-14 DIAGNOSIS — E785 Hyperlipidemia, unspecified: Secondary | ICD-10-CM | POA: Diagnosis not present

## 2019-09-14 DIAGNOSIS — K219 Gastro-esophageal reflux disease without esophagitis: Secondary | ICD-10-CM

## 2019-09-14 LAB — LDL CHOLESTEROL, DIRECT: Direct LDL: 65 mg/dL

## 2019-09-14 LAB — LIPID PANEL
Cholesterol: 146 mg/dL (ref 0–200)
HDL: 50.3 mg/dL (ref >39.00)
NonHDL: 95.61
Total CHOL/HDL Ratio: 3
Triglycerides: 234 mg/dL — ABNORMAL HIGH (ref 0.0–149.0)
VLDL: 46.8 mg/dL — ABNORMAL HIGH (ref 0.0–40.0)

## 2019-09-14 LAB — COMPREHENSIVE METABOLIC PANEL
ALT: 24 U/L (ref 0–35)
AST: 17 U/L (ref 0–37)
Albumin: 4 g/dL (ref 3.5–5.2)
Alkaline Phosphatase: 75 U/L (ref 39–117)
BUN: 20 mg/dL (ref 6–23)
CO2: 28 mEq/L (ref 19–32)
Calcium: 9.6 mg/dL (ref 8.4–10.5)
Chloride: 105 mEq/L (ref 96–112)
Creatinine, Ser: 0.76 mg/dL (ref 0.40–1.20)
GFR: 77.49 mL/min (ref >60.00)
Glucose, Bld: 196 mg/dL — ABNORMAL HIGH (ref 70–99)
Potassium: 4.9 mEq/L (ref 3.5–5.1)
Sodium: 138 mEq/L (ref 135–145)
Total Bilirubin: 0.2 mg/dL (ref 0.2–1.2)
Total Protein: 6.2 g/dL (ref 6.0–8.3)

## 2019-09-14 LAB — HEMOGLOBIN A1C: Hgb A1c MFr Bld: 7.6 % — ABNORMAL HIGH (ref 4.6–6.5)

## 2019-09-14 LAB — B12 AND FOLATE PANEL
Folate: >24.8 ng/mL (ref >5.9)
Vitamin B-12: 318 pg/mL (ref 211–911)

## 2019-09-14 MED ORDER — FLUTICASONE PROPIONATE 50 MCG/ACT NA SUSP: 2.0000 | NASAL | 3 refills | Status: DC | PRN

## 2019-09-14 MED ORDER — FLUCONAZOLE 150 MG PO TABS: 150.0000 mg | ORAL_TABLET | Freq: Once | ORAL | 0 refills | Status: AC

## 2019-09-14 NOTE — Progress Notes (Signed)
Established Patient Office Visit  Subjective:  Patient ID: Whitney [name], female    DOB: 1960/08/23  Age: 59 y.o. MRN: [id number]  CC:  Chief Complaint  Patient presents with  . Follow-up    HPI Whitney [name] is a 59 who returns for a follow up visit of acute bronchitis. She was seen on 08/30/19 for video visit and completed her Omnicef and prednisone taper with resolution of symptoms. She had a CXR that showed no active cardiopulmonary disease, small HH. Neg Covid test. She thinks she has a vaginal yeast infection after the antibiotic.   Thrombocytosis/leukocytosis: Patient did seek a consultation with Dr. Grayland Ormond in hematology for thrombocytosis and had blood work that was unrevealing.  Her platelet count improved to 366.  Most recent WBC 11.1 was chronic and unchanged with no intervention needed at this time.  She did have mild decreased ferritin level and he recommended increasing dietary iron or may take OTC iron supplementation.   Q0GQ: On Trulicity 6.76 mg  [place] weekly, glipizide 10 mg daily, Metformin 1000 mg twice daily and reports blood sugars are running in a.m. fasting between 165-205.  Few hours after breakfast blood sugar is 145.  She feels well, is trying to eat a healthy diet.  She is not able to exercise due to plantar fasciitis.  Her A1c was down from 8.8-7.4.  Due for recheck.  Goal is to get the  A1c down below 7.Vision: eye exam scheduled  GERD/ HH/ History of Barrett's esophagus: No Barrett's on last EGD in 2015. She did have mild inflammation due to acid reflux at the esophageal biopsy. Small HH.  Patient must take her Nexium 40 mg daily in order to keep from having heartburn and is well controlled.   Overweight BMI 29.9/HDL:  Crestor to 10 mg daily.  She is tolerating medication well. Due for recheck lipids.  Working on [employer]. Keeps a high triglyceride level.  Wt Readings from Last 3 Encounters:  09/14/19 191 lb 8 oz (86.9 kg)  08/30/19 187 lb (84.8 kg)    07/06/19 191 lb 11.2 oz (87 kg)   Lab Results  Component Value Date   CHOL 146 09/14/2019   HDL 50.30 09/14/2019   LDLCALC 58 09/19/2018   LDLDIRECT 65.0 09/14/2019   TRIG 234.0 (H) 09/14/2019   CHOLHDL 3 09/14/2019    Past Medical History:  Diagnosis Date  . Allergy    Seasonal  . Anxiety   . Attention deficit disorder without mention of hyperactivity   . Barrett's esophagus   . CHOLELITHIASIS 05/26/2007   Qualifier: Diagnosis of  By: Maxie Better FNP, Rosalita Levan   . Congenital anomalies of foot, not elsewhere classified   . CONSTIPATION 10/09/2008   Qualifier: Diagnosis of  By: Maxie Better FNP, Rosalita Levan   . Depression   . Diabetes mellitus without complication ([place])   . Displacement of intervertebral disc, site unspecified, without myelopathy   . DIVERTICULAR DISEASE 02/06/2002   Qualifier: Diagnosis of  By: Maxie Better FNP, Rosalita Levan   . Diverticulosis   . Elevated blood pressure reading without diagnosis of hypertension   . Esophageal reflux   . Gallstones   . Gastroenteritis 07/27/2016  . Hiatal hernia   . HIATAL HERNIA 10/16/2008   Qualifier: Diagnosis of  By: Maxie Better FNP, Rosalita Levan   . HLD (hyperlipidemia)   . Hypoglycemia, unspecified   . Internal hemorrhoids without mention of complication   . Other abnormal blood chemistry     Past  Surgical History:  Procedure Laterality Date  . BREAST CYST EXCISION Left   . CARDIAC CATHETERIZATION     [facility]  . CESAREAN SECTION    . CHOLECYSTECTOMY  06/16/2007   Dr. Hulen Skains  . COLONOSCOPY  02/06/2002   diverticula and internal hemorroids  . HIATAL HERNIA REPAIR    . LUMBAR LAMINECTOMY     L5-S1    Family History  Problem Relation Age of Onset  . Colon polyps Mother   . Hypertension Father   . Diabetes Father   . Heart disease Father   . Colon polyps Maternal Grandmother   . Heart attack Maternal Grandmother   . Heart attack Paternal Grandfather   . Diabetes Maternal Aunt   . Prostate cancer  Maternal Grandfather   . Colon cancer Paternal Grandmother     Social History   Socioeconomic History  . Marital status: Married    Spouse name: Not on file  . Number of children: 2  . Years of education: Not on file  . Highest education level: Not on file  Occupational History  . Occupation: Self employeed- Conservation officer, historic buildings  Tobacco Use  . Smoking status: Former Smoker    Types: Cigarettes  . Smokeless tobacco: Never Used  . Tobacco comment: 2 years as a teenager  Vaping Use  . Vaping Use: Never used  Substance and Sexual Activity  . Alcohol use: Not Currently    Alcohol/week: 0.0 standard drinks  . Drug use: No  . Sexual activity: Yes  Other Topics Concern  . Not on file  Social History Narrative  . Not on file   Social Determinants of Health   Financial Resource Strain:   . Difficulty of Paying Living Expenses:   Food Insecurity:   . Worried About Charity fundraiser in the Last Year:   . Arboriculturist in the Last Year:   Transportation Needs:   . Film/video editor (Medical):   Marland Kitchen Lack of Transportation (Non-Medical):   Physical Activity:   . Days of Exercise per Week:   . Minutes of Exercise per Session:   Stress:   . Feeling of Stress :   Social Connections:   . Frequency of Communication with Friends and Family:   . Frequency of Social Gatherings with Friends and Family:   . Attends Religious Services:   . Active Member of Clubs or Organizations:   . Attends Archivist Meetings:   Marland Kitchen Marital Status:   Intimate Partner Violence:   . Fear of Current or Ex-Partner:   . Emotionally Abused:   Marland Kitchen Physically Abused:   . Sexually Abused:     Outpatient Medications Prior to Visit  Medication Sig Dispense Refill  . Aspirin-Salicylamide-Caffeine (BC HEADACHE POWDER PO) Take 1 packet by mouth as needed (headache).    . cefdinir (OMNICEF) 300 MG capsule Take 1 capsule (300 mg total) by mouth 2 (two) times daily. 20 capsule 0  . cyclobenzaprine  (FLEXERIL) 5 MG tablet TAKE 1 TABLET (5 MG TOTAL) BY MOUTH 3 (THREE) TIMES DAILY AS NEEDED FOR MUSCLE SPASMS. 30 tablet 1  . Dulaglutide (TRULICITY) 9.76 BH/4.1PF SOPN Inject into the skin once weekly for diabetes. 6 mL 1  . Esomeprazole Magnesium (NEXIUM 24HR) 20 MG TBEC Take 40 capsules by mouth at bedtime.     Marland Kitchen glipiZIDE (GLUCOTROL) 10 MG tablet Take one tab po daily 60 tablet 3  . metFORMIN (GLUCOPHAGE) 1000 MG tablet TAKE 1 TABLET BY MOUTH TWICE DAILY  WITH A MEAL FOR DIABETES. 180 tablet 3  . Omega-3 Fatty Acids (FISH OIL) 1200 MG CPDR Take 1,200 mg by mouth daily.     . rosuvastatin (CRESTOR) 10 MG tablet Take 1 tablet (10 mg total) by mouth daily. 90 tablet 0  . triamcinolone cream (KENALOG) 0.1 % APPLY 1 APPLICATION TOPICALLY 2 (TWO) TIMES DAILY. 30 g 1  . fluticasone (FLONASE) 50 MCG/ACT nasal spray Place 2 sprays into both nostrils daily. (Patient taking differently: Place 2 sprays into both nostrils as needed for allergies. ) 16 g 6  . albuterol (VENTOLIN HFA) 108 (90 Base) MCG/ACT inhaler Inhale 2 puffs into the lungs every 6 (six) hours as needed for wheezing or shortness of breath. 6.7 g 0  . predniSONE (DELTASONE) 10 MG tablet Take 4 tablets ( total 40 mg) by mouth for 2 days; take 3 tablets ( total 30 mg) by mouth for 2 days; take 2 tablets ( total 20 mg) by mouth for 1 day; take 1 tablet ( total 10 mg) by mouth for 1 day. (Patient not taking: Reported on 09/14/2019) 17 tablet 0   No facility-administered medications prior to visit.    Allergies  Allergen Reactions  . Sertraline Hcl     REACTION: Headache/ vision changes  . Zoloft [Sertraline Hcl]     Same as Sertraline    ROS Review of Systems  Constitutional: Negative for chills and fever.  HENT: Negative for congestion, sinus pressure, sneezing and sore throat.   Eyes: Negative.   Respiratory: Negative for cough and shortness of breath.   Cardiovascular: Negative for chest pain, palpitations and leg swelling.    Gastrointestinal: Negative for abdominal pain, blood in stool and constipation.  Endocrine: Negative for cold intolerance, heat intolerance and polyuria.  Genitourinary: Negative for difficulty urinating, hematuria and pelvic pain.  Musculoskeletal: Negative.  Negative for back pain.  Allergic/Immunologic: Positive for environmental allergies.       Uses nose sprays allergy season.   Neurological: Negative for dizziness, seizures, light-headedness and headaches.  Hematological: Negative for adenopathy. Does not bruise/bleed easily.  Psychiatric/Behavioral:       Nor concerns about depression or anxiety      Objective:    Physical Exam Vitals reviewed.  Constitutional:      Appearance: Normal appearance.  HENT:     Head: Normocephalic and atraumatic.  Cardiovascular:     Rate and Rhythm: Normal rate and regular rhythm.     Pulses: Normal pulses.     Heart sounds: Normal heart sounds.     Comments: She comes in with tachycardia and on recheck  HR94 Pulmonary:     Effort: Pulmonary effort is normal.     Breath sounds: Normal breath sounds.  Abdominal:     Palpations: Abdomen is soft.     Tenderness: There is no abdominal tenderness.  Musculoskeletal:        General: Normal range of motion.     Cervical back: Normal range of motion and neck supple.  Skin:    General: Skin is warm and dry.  Neurological:     General: No focal deficit present.     Mental Status: She is alert and oriented to person, place, and time.  Psychiatric:        Mood and Affect: Mood normal.        Behavior: Behavior normal.     Comments: PHQ -9:0 GAD-7: 0     BP 122/78 (BP Location: Left Arm, Patient Position: Sitting, Cuff  Size: Normal)   Pulse (!) 101   Temp (!) 97.5 F (36.4 C) (Skin)   Ht '5\' 7"'  (1.702 m)   Wt 191 lb 8 oz (86.9 kg)   LMP 06/17/2011   SpO2 98%   BMI 29.99 kg/m  Wt Readings from Last 3 Encounters:  09/14/19 191 lb 8 oz (86.9 kg)  08/30/19 187 lb (84.8 kg)  07/06/19 191  lb 11.2 oz (87 kg)     Health Maintenance Due  Topic Date Due  . HIV Screening  Never done  . PAP SMEAR-Modifier  11/27/2008  . MAMMOGRAM  02/20/2014  . OPHTHALMOLOGY EXAM  06/11/2018    There are no preventive care reminders to display for this patient.  Lab Results  Component Value Date   TSH 1.47 05/29/2019   Lab Results  Component Value Date   WBC 11.1 (H) 07/06/2019   HGB 13.7 07/06/2019   HCT 42.4 07/06/2019   MCV 85.0 07/06/2019   PLT 366 07/06/2019   Lab Results  Component Value Date   NA 138 09/14/2019   K 4.9 09/14/2019   CO2 28 09/14/2019   GLUCOSE 196 (H) 09/14/2019   BUN 20 09/14/2019   CREATININE 0.76 09/14/2019   BILITOT 0.2 09/14/2019   ALKPHOS 75 09/14/2019   AST 17 09/14/2019   ALT 24 09/14/2019   PROT 6.2 09/14/2019   ALBUMIN 4.0 09/14/2019   CALCIUM 9.6 09/14/2019   ANIONGAP 11 08/17/2018   GFR 77.49 09/14/2019   Lab Results  Component Value Date   CHOL 146 09/14/2019   Lab Results  Component Value Date   HDL 50.30 09/14/2019   Lab Results  Component Value Date   LDLCALC 58 09/19/2018   Lab Results  Component Value Date   TRIG 234.0 (H) 09/14/2019   Lab Results  Component Value Date   CHOLHDL 3 09/14/2019   Lab Results  Component Value Date   HGBA1C 7.6 (H) 09/14/2019      Assessment & Plan:   Problem List Items Addressed This Visit      Digestive   G E R D    GERD/ HH/ History of Barrett's esophagus: No Barrett's on last EGD in 2015. She did have mild inflammation due to acid reflux at the esophageal biopsy.  Patient must take her Nexium 40 mg daily in order to keep from having heartburn. Well controlled.          Endocrine   Type 2 diabetes mellitus ([place]) - Primary    No GI side effects with Trulicity or Metformin.  Remains on glipizide as well.  A1c went down from 8.8-7.4.  Due for recheck.  I would like to titrate her off the glipizide to avoid pancreas burnout.      Relevant Orders   Comp Met (CMET)  (Completed)   HgB A1c (Completed)     Other   HLD (hyperlipidemia)    Crestor increased to 10 mg daily- Much improved LDL 58. Triglycerides still elevated: Recommend low glycemic index diet. Try stationary bike exercise.   Lab Results  Component Value Date   CHOL 146 09/14/2019   HDL 50.30 09/14/2019   LDLCALC 58 09/19/2018   LDLDIRECT 65.0 09/14/2019   TRIG 234.0 (H) 09/14/2019   CHOLHDL 3 09/14/2019        Relevant Orders   Lipid Profile (Completed)   Anemia, iron deficiency    Thrombocytosis/leukocytosis: Hematology consult with negative laboratory  findings and no need for further w/p. She does have  a normal Hgb and MCV , but decreased ferritin level. Recommended OTC iron supplementation.       Fatigue   Relevant Orders   B12 and Folate Panel (Completed)   Overweight (BMI 25.0-29.9)   History of Barrett's esophagus    Barrett's esophagus found on EGD in 2000.  History of Barrett's esophagus: No Barrett's on last EGD in 2015. She did have mild inflammation due to acid reflux at the esophageal biopsy. Small HH.  Patient must take her Nexium 40 mg daily in order to keep from having heartburn and is well controlled.          Other Visit Diagnoses    Nasal congestion       Relevant Medications   fluticasone (FLONASE) 50 MCG/ACT nasal spray   Acute vaginitis          Meds ordered this encounter  Medications  . fluconazole (DIFLUCAN) 150 MG tablet    Sig: Take 1 tablet (150 mg total) by mouth once for 1 dose.    Dispense:  1 tablet    Refill:  0    Order Specific Question:   Supervising Provider    Answer:   Einar Pheasant [id number]  . fluticasone (FLONASE) 50 MCG/ACT nasal spray    Sig: Place 2 sprays into both nostrils as needed for allergies.    Dispense:  18 mL    Refill:  3    Order Specific Question:   Supervising Provider    Answer:   Einar Pheasant [[id number]]    Follow-up: Return in about 3 months (around 12/15/2019).  This visit occurred during the  SARS-CoV-2 public health emergency.  Safety protocols were in place, including screening questions prior to the visit, additional usage of staff PPE, and extensive cleaning of exam room while observing appropriate contact time as indicated for disinfecting solutions.    Denice Paradise, NP

## 2019-09-14 NOTE — Patient Instructions (Signed)
Please go to the lab today.    I have ordered the Diflucan for you for the vaginal yeast infection.  Let me know if it does not resolve the problem.  Continue work on [employer] and exercise.

## 2019-09-15 ENCOUNTER — Encounter: Payer: Self-pay | Admitting: Nurse Practitioner

## 2019-09-15 DIAGNOSIS — Z8719 Personal history of other diseases of the digestive system: Secondary | ICD-10-CM | POA: Insufficient documentation

## 2019-09-15 DIAGNOSIS — E663 Overweight: Secondary | ICD-10-CM | POA: Insufficient documentation

## 2019-09-15 NOTE — Assessment & Plan Note (Addendum)
She had Barrett's on 09/1998 EGD.  Last EGD in 2015 was negative for Barrett's. No repeat EGD requested. Patient must take her Nexium 40 mg daily for GERD/HH and is controlled well.

## 2019-09-15 NOTE — Assessment & Plan Note (Signed)
No GI side effects with Trulicity or Metformin.  Remains on glipizide as well.  A1c went down from 8.8-7.4.  Due for recheck.  I would like to titrate her off the glipizide to avoid pancreas burnout.

## 2019-09-15 NOTE — Assessment & Plan Note (Addendum)
  Lab Results  Component Value Date   WBC 11.1 (H) 07/06/2019   HGB 13.7 07/06/2019   HCT 42.4 07/06/2019   MCV 85.0 07/06/2019   PLT 366 07/06/2019

## 2019-09-15 NOTE — Assessment & Plan Note (Signed)
Barrett's esophagus found on EGD in 2000.  History of Barrett's esophagus: No Barrett's on last EGD in 2015. She did have mild inflammation due to acid reflux at the esophageal biopsy. Small HH.  Patient must take her Nexium 40 mg daily in order to keep from having heartburn and is well controlled.

## 2019-09-15 NOTE — Assessment & Plan Note (Signed)
GERD/ HH/ History of Barrett's esophagus: No Barrett's on last EGD in 2015. She did have mild inflammation due to acid reflux at the esophageal biopsy.  Patient must take her Nexium 40 mg daily in order to keep from having heartburn. Well controlled.

## 2019-09-15 NOTE — Assessment & Plan Note (Signed)
BMI now 29.99  Working on weight loss - diet Wt Readings from Last 3 Encounters:  09/14/19 191 lb 8 oz (86.9 kg)  08/30/19 187 lb (84.8 kg)  07/06/19 191 lb 11.2 oz (87 kg)

## 2019-09-15 NOTE — Assessment & Plan Note (Addendum)
Thrombocytosis/leukocytosis: Hematology consult with negative laboratory  findings and no need for further w/p. She does have a normal Hgb and MCV , but decreased ferritin level. Recommended OTC iron supplementation.

## 2019-09-15 NOTE — Assessment & Plan Note (Signed)
Crestor increased to 10 mg daily- Much improved LDL 58. Triglycerides still elevated: Recommend low glycemic index diet. Try stationary bike exercise.   Lab Results  Component Value Date   CHOL 146 09/14/2019   HDL 50.30 09/14/2019   LDLCALC 58 09/19/2018   LDLDIRECT 65.0 09/14/2019   TRIG 234.0 (H) 09/14/2019   CHOLHDL 3 09/14/2019

## 2019-09-19 ENCOUNTER — Other Ambulatory Visit: Payer: Self-pay | Admitting: Nurse Practitioner

## 2019-09-19 ENCOUNTER — Telehealth: Payer: Self-pay | Admitting: Nurse Practitioner

## 2019-09-19 DIAGNOSIS — E119 Type 2 diabetes mellitus without complications: Secondary | ICD-10-CM

## 2019-09-19 MED ORDER — TRULICITY 1.5 MG/0.5ML ~~LOC~~ SOAJ
1.5000 mg | SUBCUTANEOUS | 2 refills | Status: DC
Start: 2019-09-19 — End: 2019-09-19

## 2019-09-19 MED ORDER — TRULICITY 1.5 MG/0.5ML ~~LOC~~ SOAJ: 1.5000 mg | SUBCUTANEOUS | 2 refills | Status: DC

## 2019-09-19 MED ORDER — GLIPIZIDE 5 MG PO TABS
5.0000 mg | ORAL_TABLET | Freq: Every day | ORAL | 3 refills | Status: DC
Start: 2019-09-19 — End: 2020-01-29

## 2019-09-19 NOTE — Telephone Encounter (Signed)
Pt sent My chart: I will increase the Trulicity to 1.5 mg weekly. Monitor for GI upset- should get better over time if it occurs. Call if having diarrhea.   Change  glipizide to 5 mg daily  Continue on the same dose of metformin.  Please check BS before breakfast and 2 hour after meals.  Bring to next appt -   PLAN  1. Please give her an appt end July with me and I will need to check her Bmet

## 2019-09-20 NOTE — Telephone Encounter (Signed)
Patient appt scheduled for 10/18/19 at 9:30am

## 2019-09-21 ENCOUNTER — Other Ambulatory Visit: Payer: Self-pay | Admitting: Nurse Practitioner

## 2019-09-21 DIAGNOSIS — E119 Type 2 diabetes mellitus without complications: Secondary | ICD-10-CM

## 2019-09-24 ENCOUNTER — Encounter: Payer: Self-pay | Admitting: Nurse Practitioner

## 2019-09-24 ENCOUNTER — Other Ambulatory Visit: Payer: Self-pay | Admitting: Nurse Practitioner

## 2019-09-24 MED ORDER — TRULICITY 1.5 MG/0.5ML ~~LOC~~ SOAJ: 1.5000 mg | SUBCUTANEOUS | 1 refills | Status: DC

## 2019-09-24 NOTE — Progress Notes (Signed)
I ordered a 3 month supply of Trulicity #48 pens with 1 refill to her mail order pharmacy.

## 2019-09-25 ENCOUNTER — Other Ambulatory Visit: Payer: Self-pay | Admitting: Nurse Practitioner

## 2019-10-09 ENCOUNTER — Other Ambulatory Visit: Payer: Self-pay | Admitting: Nurse Practitioner

## 2019-10-10 MED ORDER — ROSUVASTATIN CALCIUM 10 MG PO TABS: 10.0000 mg | ORAL_TABLET | Freq: Every day | ORAL | 0 refills | Status: DC

## 2019-10-18 ENCOUNTER — Other Ambulatory Visit: Payer: Self-pay

## 2019-10-18 ENCOUNTER — Ambulatory Visit: Payer: 59 | Admitting: Nurse Practitioner

## 2019-10-19 ENCOUNTER — Encounter: Payer: Self-pay | Admitting: Nurse Practitioner

## 2019-10-19 ENCOUNTER — Telehealth (INDEPENDENT_AMBULATORY_CARE_PROVIDER_SITE_OTHER): Payer: 59 | Admitting: Nurse Practitioner

## 2019-10-19 VITALS — Temp 96.8°F | Ht 67.0 in | Wt 192.0 lb

## 2019-10-19 DIAGNOSIS — E119 Type 2 diabetes mellitus without complications: Secondary | ICD-10-CM

## 2019-10-19 DIAGNOSIS — D509 Iron deficiency anemia, unspecified: Secondary | ICD-10-CM

## 2019-10-19 DIAGNOSIS — M5136 Other intervertebral disc degeneration, lumbar region: Secondary | ICD-10-CM

## 2019-10-19 DIAGNOSIS — E785 Hyperlipidemia, unspecified: Secondary | ICD-10-CM

## 2019-10-19 MED ORDER — CYCLOBENZAPRINE HCL 5 MG PO TABS: 5.0000 mg | ORAL_TABLET | Freq: Every evening | ORAL | 1 refills | Status: DC | PRN

## 2019-10-19 NOTE — Assessment & Plan Note (Signed)
Refilled flexeril for HS use as needed- using <1/wk

## 2019-10-19 NOTE — Progress Notes (Signed)
Virtual Visit via Video Note  This visit type was conducted due to national recommendations for restrictions regarding the COVID-19 pandemic (e.g. social distancing).  This format is felt to be most appropriate for this patient at this time.  All issues noted in this document were discussed and addressed.  No physical exam was performed (except for noted visual exam findings with Video Visits).   I connected with@ on 10/20/19 at  8:00 AM EDT by a video enabled telemedicine application or telephone and verified that I am speaking with the correct person using two identifiers. Location patient: home Location provider: work or home office Persons participating in the virtual visit: patient, provider  I discussed the limitations, risks, security and privacy concerns of performing an evaluation and management service by telephone and the availability of in person appointments. I also discussed with the patient that there may be a patient responsible charge related to this service. The patient expressed understanding and agreed to proceed.  Reason for visit: follow up DM medication dose change, needs refills of Flexeril  HPI: This 59 year old patient presents today for follow-up of diabetes mellitus.  Since her last visit in June, she has increased her Trulicity to 1.5 mg weekly, remains on glipizide 5 mg daily, and Metformin 1000 mg twice a day.  She checks her blood sugars regularly and has noted some improvement.  FBS 178-235 and 2 hour PP 130-96-125 range.  She has had no low blood sugars or signs of side effects that she can tell.  She feels well.  Diet and appetite are normal.  HA/Stress: She has found that over-the-counter magnesium 125 mg once or twice a day seems to make her feel better overall.  She has less stress, fewer HA and sleeps better.  Lumbar spine DDD/history of left leg radiculopathy MRI of the lumbar spine 02/16/2019.  She had injections in the past: She would like a refill on her  Flexeril 5 mg to take at bedtime.  She only uses it when her back is bothering her notes maybe once a week.  She takes Flexeril at nighttime and that not can avoid taking Advil.  She says it works well and she has had no problems with fatigue or falls with medication.  No problems with radiculopathy.  Fatigue/iron deficiency anemia. thrombocytosis: Mild, last saw Dr. Grayland Ormond in April and he has no further recs for WBC, advised her to take over-the-counter iron supplements  for  low ferritin,, normal Hgb.  Most recent colonoscopy 2015.  She has noted no blood or melena stool.  She has not started OTC iron, but is planning to do so.  She is wondering if her diabetes is making her feel tired.  She had Covid testing negative.  ROS: See pertinent positives and negatives per HPI. GYN/mammo appt  in Sept.   Past Medical History:  Diagnosis Date   Allergy    Seasonal   Anxiety    Attention deficit disorder without mention of hyperactivity    Barrett's esophagus    CHOLELITHIASIS 05/26/2007   Qualifier: Diagnosis of  By: Maxie Better FNP, Billie-Lynn Daniels    Congenital anomalies of foot, not elsewhere classified    CONSTIPATION 10/09/2008   Qualifier: Diagnosis of  By: Maxie Better FNP, Rosalita Levan    Depression    Diabetes mellitus without complication ([place])    Displacement of intervertebral disc, site unspecified, without myelopathy    DIVERTICULAR DISEASE 02/06/2002   Qualifier: Diagnosis of  By: Maxie Better FNP, Rosalita Levan  Diverticulosis    Elevated blood pressure reading without diagnosis of hypertension    Esophageal reflux    Gallstones    Gastroenteritis 07/27/2016   Hiatal hernia    HIATAL HERNIA 10/16/2008   Qualifier: Diagnosis of  By: Maxie Better FNP, Rosalita Levan    HLD (hyperlipidemia)    Hypoglycemia, unspecified    Internal hemorrhoids without mention of complication    Other abnormal blood chemistry     Past Surgical History:  Procedure Laterality Date    BREAST CYST EXCISION Left    CARDIAC CATHETERIZATION     [facility]   CESAREAN SECTION     CHOLECYSTECTOMY  06/16/2007   Dr. Hulen Skains   COLONOSCOPY  02/06/2002   diverticula and internal hemorroids   COLONOSCOPY  2015   HIATAL HERNIA REPAIR     LUMBAR LAMINECTOMY     L5-S1    Family History  Problem Relation Age of Onset   Colon polyps Mother    Hypertension Father    Diabetes Father    Heart disease Father    Colon polyps Maternal Grandmother    Heart attack Maternal Grandmother    Heart attack Paternal Grandfather    Diabetes Maternal Aunt    Prostate cancer Maternal Grandfather    Colon cancer Paternal Grandmother     SOCIAL HX: Former smoker- 2 years as a teen   Current Outpatient Medications:    Aspirin-Salicylamide-Caffeine (BC HEADACHE POWDER PO), Take 1 packet by mouth as needed (headache)., Disp: , Rfl:    cyclobenzaprine (FLEXERIL) 5 MG tablet, Take 1 tablet (5 mg total) by mouth at bedtime as needed for muscle spasms., Disp: 30 tablet, Rfl: 1   Dulaglutide (TRULICITY) 1.5 ZT/2.4PY SOPN, Inject 0.5 mLs (1.5 mg total) into the skin once a week., Disp: 12 pen, Rfl: 1   Esomeprazole Magnesium (NEXIUM 24HR) 20 MG TBEC, Take 40 capsules by mouth at bedtime. , Disp: , Rfl:    fluticasone (FLONASE) 50 MCG/ACT nasal spray, Place 2 sprays into both nostrils as needed for allergies., Disp: 18 mL, Rfl: 3   glipiZIDE (GLUCOTROL) 5 MG tablet, Take 1 tablet (5 mg total) by mouth daily., Disp: 60 tablet, Rfl: 3   metFORMIN (GLUCOPHAGE) 1000 MG tablet, TAKE 1 TABLET BY MOUTH TWICE DAILY WITH A MEAL FOR DIABETES., Disp: 180 tablet, Rfl: 3   Omega-3 Fatty Acids (FISH OIL) 1200 MG CPDR, Take 1,200 mg by mouth daily. , Disp: , Rfl:    rosuvastatin (CRESTOR) 10 MG tablet, Take 1 tablet (10 mg total) by mouth daily., Disp: 90 tablet, Rfl: 0   triamcinolone cream (KENALOG) 0.1 %, APPLY 1 APPLICATION TOPICALLY 2 (TWO) TIMES DAILY., Disp: 30 g, Rfl:  1  EXAM:  VITALS per patient if applicable:  GENERAL: alert, oriented, appears well and in no acute distress  HEENT: atraumatic, conjunctiva clear, no obvious abnormalities on inspection of external nose and ears  NECK: normal movements of the head and neck  LUNGS: on inspection no signs of respiratory distress, breathing rate appears normal, no obvious gross SOB, gasping or wheezing  CV: no obvious cyanosis  MS: moves all visible extremities without noticeable abnormality  PSYCH/NEURO: pleasant and cooperative, no obvious depression or anxiety, speech and thought processing grossly intact  ASSESSMENT AND PLAN: Cbc and iron in Sept.  Discussed the following assessment and plan:  Type 2 diabetes mellitus without complication, without long-term current use of insulin (HCC)  Iron deficiency anemia, unspecified iron deficiency anemia type  DDD (degenerative disc disease), lumbar  Refilled flexeril for HS use as needed- using <1/wk  Pt advised:  Please continue with current diabetic regimen.  We will see you with in September with morning appointment.  We can get fasting blood work at that time.  Please let us know who performed your dilated eye exam so we can get the records.  You have a normal hemoglobin but slightly low ferritin. Dr. Grayland Ormond in Hematology recommended increase dietary iron or take over the counter iron. You can try Vitron C .  His last colonoscopy was 2015 and it showed a hyperplastic polyp.  You are due for repeat colonoscopy in 2025.  I will give her a set of stool cards in September to take home.  For your back pain, I refilled your Flexeril.  Please see GYN if you plan to for your Pap test and mammogram. I discussed the assessment and treatment plan with the patient. The patient was provided an opportunity to ask questions and all were answered. The patient agreed with the plan and demonstrated an understanding of the instructions.   The patient was  advised to call back or seek an in-person evaluation if the symptoms worsen or if the condition fails to improve as anticipated.  Denice Paradise, NP Adult Nurse Practitioner [place] [phone]

## 2019-10-19 NOTE — Patient Instructions (Addendum)
Please continue with current diabetic regimen.  We will see you with in September with morning appointment.  We can get fasting blood work at that time.  Please let us know who performed your dilated eye exam so we can get the records.  You have a normal hemoglobin but slightly low ferritin. Dr. Grayland Ormond in Hematology recommended increase dietary iron or take over the counter iron. You can try Vitron C .  His last colonoscopy was 2015 and it showed a hyperplastic polyp.  You are due for repeat colonoscopy in 2025.  For your back pain, I refilled your Flexeril.  Please see GYN if you plan to for your Pap test and mammogram.

## 2019-11-28 LAB — FECAL OCCULT BLOOD, IMMUNOCHEMICAL: IFOBT: NEGATIVE

## 2019-12-07 ENCOUNTER — Other Ambulatory Visit: Payer: Self-pay | Admitting: Nurse Practitioner

## 2019-12-07 DIAGNOSIS — R0981 Nasal congestion: Secondary | ICD-10-CM

## 2019-12-10 LAB — HM MAMMOGRAPHY

## 2019-12-17 ENCOUNTER — Telehealth: Payer: 59 | Admitting: Nurse Practitioner

## 2019-12-18 ENCOUNTER — Other Ambulatory Visit: Payer: Self-pay | Admitting: Nurse Practitioner

## 2019-12-24 ENCOUNTER — Encounter: Payer: Self-pay | Admitting: Nurse Practitioner

## 2019-12-24 ENCOUNTER — Telehealth (INDEPENDENT_AMBULATORY_CARE_PROVIDER_SITE_OTHER): Payer: 59 | Admitting: Nurse Practitioner

## 2019-12-24 ENCOUNTER — Other Ambulatory Visit: Payer: Self-pay

## 2019-12-24 ENCOUNTER — Telehealth: Payer: Self-pay | Admitting: Nurse Practitioner

## 2019-12-24 VITALS — BP 151/106 | HR 78 | Ht 67.0 in | Wt 186.0 lb

## 2019-12-24 DIAGNOSIS — E119 Type 2 diabetes mellitus without complications: Secondary | ICD-10-CM | POA: Diagnosis not present

## 2019-12-24 DIAGNOSIS — E785 Hyperlipidemia, unspecified: Secondary | ICD-10-CM | POA: Diagnosis not present

## 2019-12-24 DIAGNOSIS — E663 Overweight: Secondary | ICD-10-CM

## 2019-12-24 DIAGNOSIS — Z8719 Personal history of other diseases of the digestive system: Secondary | ICD-10-CM | POA: Diagnosis not present

## 2019-12-24 DIAGNOSIS — K219 Gastro-esophageal reflux disease without esophagitis: Secondary | ICD-10-CM

## 2019-12-24 NOTE — Telephone Encounter (Signed)
Patient needs to be put on schedule for medication management for 60 min per Denice Paradise and need to call Catie Darnelle Maffucci when she is scheduled

## 2019-12-24 NOTE — Progress Notes (Signed)
Virtual Visit via Video Note  This visit type was conducted due to national recommendations for restrictions regarding the COVID-19 pandemic (e.g. social distancing).  This format is felt to be most appropriate for this patient at this time.  All issues noted in this document were discussed and addressed.  No physical exam was performed (except for noted visual exam findings with Video Visits).   I connected with@ on 12/24/19 at  8:30 AM EDT by a video enabled telemedicine application or telephone and verified that I am speaking with the correct person using two identifiers. Location patient: home Location provider: work or home office Persons participating in the virtual visit: patient, provider  I discussed the limitations, risks, security and privacy concerns of performing an evaluation and management service by telephone and the availability of in person appointments. I also discussed with the patient that there may be a patient responsible charge related to this service. The patient expressed understanding and agreed to proceed.   Reason for visit: Routine follow-up of diabetes mellitus on increased dose of Trulicity to 1.5 mg weekly, glipizide 5 mg daily, Metformin 1000 mg twice daily.  She also has a history of  IDA, followed by hematology, and was advised oral iron therapy. History of degenerative disc disease, lumbar back pain and takes  Flexeril. Her last office visit was 10/19/2019.  She was advised to follow-up this month for office visit and fasting blood work.    HPI: Whitney [name] presents with a video visit today.  She is unaware that she was to have blood work drawn.  She comes in to the video with her husband sharing the screen.  They have been doing research about medications and had a list of her current side effects and how on her current prescription drugs are likely causing her side effects. He shakes a drug insert at me to show how long the list of SE are.  He made a list of her  symptoms and how it correlates with each drug and their known side effects.  She has 10 out of possible 16 side effects for her diabetes medicines, 6 side effects out of 16 for her Crestor that she has been on for years, and 8 side effects on the  Nexium that she has taken for years.The drug side effects that she is experiencing include brain fog, HA, stiff joints , lack of motivation, sleeplessness, anxiety dizziness, exhaustion, numbness in feet and left finger- 4th , bloating and appetite loss, heartburn- chronic, some constipation and muscle spasm and nausea.  She has stopped her diabetic medications in favor of Berberine Mix which is a natural medication that is advertised as working as well as Metformin in lowering blood sugar.  She believes that Metformin was causing headache, stomachache, dizziness, exhaustion, bloating, appetite change, heartburn and constipation.  Those are all of the listed side effects that she is experiencing.  She and her husband have decided that Nexium is bad for her because it shuts off the acid in the stomach and may be giving her heartburn and nausea.  She is now taking a digestive enzyme and aloe vera juice for history of Barrett's esophagus.  She reports coming off of Nexium for 3 days and she has less fatigue.  She is also now walking 30 minutes on a regular basis.  She is taking her Crestor every other day.  Elevated blood pressure without diagnosis of hypertension: Blood pressure when she checked in today 151/106, then she repeated it at home  at 111/65, and then lastly repeated again at 149/103.  She is not on blood pressure medication.  BP Readings from Last 3 Encounters:  12/24/19 (!) 151/106  09/14/19 122/78  07/06/19 98/67     ROS: See pertinent positives and negatives per HPI.  Past Medical History:  Diagnosis Date  . Allergy    Seasonal  . Anxiety   . Attention deficit disorder without mention of hyperactivity   . Barrett's esophagus   .  CHOLELITHIASIS 05/26/2007   Qualifier: Diagnosis of  By: Maxie Better FNP, Rosalita Levan   . Congenital anomalies of foot, not elsewhere classified   . CONSTIPATION 10/09/2008   Qualifier: Diagnosis of  By: Maxie Better FNP, Rosalita Levan   . Depression   . Diabetes mellitus without complication ([place])   . Displacement of intervertebral disc, site unspecified, without myelopathy   . DIVERTICULAR DISEASE 02/06/2002   Qualifier: Diagnosis of  By: Maxie Better FNP, Rosalita Levan   . Diverticulosis   . Elevated blood pressure reading without diagnosis of hypertension   . Esophageal reflux   . Gallstones   . Gastroenteritis 07/27/2016  . Hiatal hernia   . HIATAL HERNIA 10/16/2008   Qualifier: Diagnosis of  By: Maxie Better FNP, Rosalita Levan   . HLD (hyperlipidemia)   . Hypoglycemia, unspecified   . Internal hemorrhoids without mention of complication   . Other abnormal blood chemistry     Past Surgical History:  Procedure Laterality Date  . BREAST CYST EXCISION Left   . CARDIAC CATHETERIZATION     [facility]  . CESAREAN SECTION    . CHOLECYSTECTOMY  06/16/2007   Dr. Hulen Skains  . COLONOSCOPY  02/06/2002   diverticula and internal hemorroids  . COLONOSCOPY  2015  . HIATAL HERNIA REPAIR    . LUMBAR LAMINECTOMY     L5-S1    Family History  Problem Relation Age of Onset  . Colon polyps Mother   . Hypertension Father   . Diabetes Father   . Heart disease Father   . Colon polyps Maternal Grandmother   . Heart attack Maternal Grandmother   . Heart attack Paternal Grandfather   . Diabetes Maternal Aunt   . Prostate cancer Maternal Grandfather   . Colon cancer Paternal Grandmother     SOCIAL HX: She smoked 2 years as a teenager.  None since.   Current Outpatient Medications:  .  Cholecalciferol (VITAMIN D3) 50 MCG (2000 UT) capsule, Take 2,000 Units by mouth daily., Disp: , Rfl:  .  cyclobenzaprine (FLEXERIL) 5 MG tablet, Take 1 tablet (5 mg total) by mouth at bedtime as needed for muscle  spasms., Disp: 30 tablet, Rfl: 1 .  Digestive Enzyme CAPS, Take by mouth., Disp: , Rfl:  .  Esomeprazole Magnesium (NEXIUM 24HR) 20 MG TBEC, Take 40 capsules by mouth at bedtime. , Disp: , Rfl:  .  fluticasone (FLONASE) 50 MCG/ACT nasal spray, PLACE 2 SPRAYS INTO BOTH NOSTRILS AS NEEDED FOR ALLERGIES., Disp: 48 mL, Rfl: 1 .  glipiZIDE (GLUCOTROL) 5 MG tablet, Take 1 tablet (5 mg total) by mouth daily., Disp: 60 tablet, Rfl: 3 .  metFORMIN (GLUCOPHAGE) 1000 MG tablet, TAKE 1 TABLET BY MOUTH TWICE DAILY WITH A MEAL FOR DIABETES., Disp: 180 tablet, Rfl: 3 .  Omega-3 Fatty Acids (FISH OIL) 1200 MG CPDR, Take 1,200 mg by mouth daily. , Disp: , Rfl:  .  rosuvastatin (CRESTOR) 10 MG tablet, Take 1 tablet (10 mg total) by mouth daily., Disp: 90 tablet, Rfl: 0 .  triamcinolone  cream (KENALOG) 0.1 %, APPLY 1 APPLICATION TOPICALLY 2 (TWO) TIMES DAILY., Disp: 30 g, Rfl: 1 .  TRULICITY 1.5 IW/9.7LG SOPN, INJECT 0.5 MLS (1.5 MG TOTAL) INTO THE SKIN ONCE A WEEK FOR 28 DAYS., Disp: 6 mL, Rfl: 0  EXAM:  VITALS per patient if applicable:  GENERAL: alert, oriented, appears well and in no acute distress  HEENT: atraumatic, conjunctiva clear, no obvious abnormalities on inspection of external nose and ears  NECK: normal movements of the head and neck  LUNGS: on inspection no signs of respiratory distress, breathing rate appears normal, no obvious gross SOB, gasping or wheezing  CV: no obvious cyanosis  MS: moves all visible extremities without noticeable abnormality  PSYCH/NEURO: pleasant and cooperative, no obvious depression or anxiety, speech and thought processing grossly intact  ASSESSMENT AND PLAN:  Discussed the following assessment and plan:  Type 2 diabetes mellitus without complication, without long-term current use of insulin (HCC) - Plan: Referral to Chronic Care Management Services  Hyperlipidemia, unspecified hyperlipidemia type - Plan: Referral to Chronic Care Management  Services  History of Barrett's esophagus - Plan: Referral to Chronic Care Management Services  Overweight (BMI 25.0-29.9) - Plan: Referral to Chronic Care Management Services  Gastroesophageal reflux disease without esophagitis - Plan: Referral to Chronic Care Management Services  No problem-specific Assessment & Plan notes found for this encounter.  Pt advised:  Recommend in person office visit for baseline labs and further discussion regarding your discontinuation of medication.  You are off of your Metformin, glipizide, and Nexium medications that are all important for your health and wellbeing.  We need to explore your current symptoms-side effects and correlate those closely with your medications to come up with a plan since you are at risk for having your diabetes, lipids, and Barrett's esophagus/GERD become uncontrolled.   We discussed getting a referral to chronic care management so that we can meet in the office with the clinical pharmacist to go over all of this in person.  Please bring your husband in as well and the charts that he has created so we can review these concerns one by one.   I discussed the assessment and treatment plan with the patient. The patient was provided an opportunity to ask questions and all were answered. The patient agreed with the plan and demonstrated an understanding of the instructions.   The patient was advised to call back or seek an in-person evaluation if the symptoms worsen or if the condition fails to improve as anticipated.  Denice Paradise, NP Adult Nurse Practitioner [place] [phone]

## 2019-12-25 ENCOUNTER — Encounter: Payer: Self-pay | Admitting: Nurse Practitioner

## 2019-12-25 NOTE — Patient Instructions (Addendum)
Recommend in person office visit for baseline labs and further discussion regarding your discontinuation of medication.  You are off of your Metformin, glipizide, and Nexium medications that are all important for your health and wellbeing.  We need to explore your current symptoms-side effects and correlate those closely with your medications to come up with a plan since you are at risk for having your diabetes, lipids, and Barrett's esophagus/GERD become uncontrolled.   We discussed getting a referral to chronic care management so that we can meet in the office with the clinical pharmacist to go over all of this in person.  Please bring your husband in as well and the charts that he has created so we can review these concerns one by one.    Marland Kitchen

## 2019-12-31 ENCOUNTER — Other Ambulatory Visit: Payer: Self-pay

## 2020-01-02 ENCOUNTER — Ambulatory Visit: Payer: 59

## 2020-01-02 ENCOUNTER — Ambulatory Visit: Payer: 59 | Admitting: Nurse Practitioner

## 2020-01-02 ENCOUNTER — Telehealth: Payer: Self-pay | Admitting: Pharmacist

## 2020-01-02 NOTE — Telephone Encounter (Signed)
She canceled today d/t Covid exposure. Can you set up a f/up appt in 3-4 weeks?

## 2020-01-02 NOTE — Chronic Care Management (AMB) (Signed)
°  Chronic Care Management   Note  01/02/2020 Name: TERIANNE THAKER MRN: [id number] DOB: 1960/10/06   Patient had to cancel PCP visit and my co-visit today d/t COVID exposure.   Plan: - Will collaborate with Care Guide to outreach in ~2 weeks to schedule follow up face to face with myself and PCP  Catie Darnelle Maffucci, PharmD, [place], [place] Pharmacist [facility] [phone]

## 2020-01-02 NOTE — Telephone Encounter (Signed)
Yes ma'am! 

## 2020-01-08 NOTE — Telephone Encounter (Signed)
Patient is rescheduled to see Kim on 01/25/20 at 9:00 am

## 2020-01-11 ENCOUNTER — Ambulatory Visit: Payer: 59 | Admitting: Nurse Practitioner

## 2020-01-11 ENCOUNTER — Other Ambulatory Visit: Payer: 59

## 2020-01-17 LAB — HM DEXA SCAN

## 2020-01-24 ENCOUNTER — Other Ambulatory Visit: Payer: Self-pay

## 2020-01-25 ENCOUNTER — Ambulatory Visit: Payer: 59 | Admitting: Nurse Practitioner

## 2020-01-29 ENCOUNTER — Ambulatory Visit: Payer: 59 | Admitting: Pharmacist

## 2020-01-29 ENCOUNTER — Encounter: Payer: Self-pay | Admitting: Nurse Practitioner

## 2020-01-29 ENCOUNTER — Ambulatory Visit: Payer: 59 | Admitting: Nurse Practitioner

## 2020-01-29 ENCOUNTER — Other Ambulatory Visit: Payer: Self-pay

## 2020-01-29 VITALS — BP 120/82 | HR 87 | Temp 97.4°F | Ht 67.0 in | Wt 185.0 lb

## 2020-01-29 DIAGNOSIS — E119 Type 2 diabetes mellitus without complications: Secondary | ICD-10-CM | POA: Diagnosis not present

## 2020-01-29 DIAGNOSIS — E785 Hyperlipidemia, unspecified: Secondary | ICD-10-CM

## 2020-01-29 DIAGNOSIS — E663 Overweight: Secondary | ICD-10-CM

## 2020-01-29 DIAGNOSIS — Z23 Encounter for immunization: Secondary | ICD-10-CM

## 2020-01-29 DIAGNOSIS — D509 Iron deficiency anemia, unspecified: Secondary | ICD-10-CM | POA: Diagnosis not present

## 2020-01-29 DIAGNOSIS — Z8719 Personal history of other diseases of the digestive system: Secondary | ICD-10-CM

## 2020-01-29 MED ORDER — FREESTYLE LIBRE 2 SENSOR MISC: 2 refills | Status: DC

## 2020-01-29 NOTE — Chronic Care Management (AMB) (Signed)
Chronic Care Management   Follow Up Note   01/29/2020 Name: Whitney [name] MRN: [id number] DOB: 1960/07/10  Referred by: Theadore Nan, NP Reason for referral : Chronic Care Management (Medication Management)   Whitney [name] is a 59 y.o. year old female who is a primary care patient of Theadore Nan, NP. The CCM team was consulted for assistance with chronic disease management and care coordination needs.    Met with patient and husband face to face for medication management review w/ PCP.   Review of patient status, including review of consultants reports, relevant laboratory and other test results, and collaboration with appropriate care team members and the patient's provider was performed as part of comprehensive patient evaluation and provision of chronic care management services.    SDOH (Social Determinants of Health) assessments performed: No See Care Plan activities for detailed interventions related to [facility])     Outpatient Encounter Medications as of 01/29/2020  Medication Sig  . ALOE VERA JUICE PO Take by mouth. Using for GI stomach  . Barberry-Oreg Grape-Goldenseal (BERBERINE COMPLEX PO) Take 500 mg by mouth in the morning, at noon, and at bedtime.  . Cholecalciferol (VITAMIN D3) 50 MCG (2000 UT) capsule Take 2,000 Units by mouth daily.  . Coenzyme Q10 (COQ10) 30 MG CAPS Take by mouth.  . Omega-3 Fatty Acids (FISH OIL) 1200 MG CPDR Take 1,200 mg by mouth daily.   . Red Yeast Rice 600 MG CAPS Take by mouth.  . TRULICITY 1.5 MG/0.5ML SOPN INJECT 0.5 MLS (1.5 MG TOTAL) INTO THE SKIN ONCE A WEEK FOR 28 DAYS.  Marland Kitchen cyclobenzaprine (FLEXERIL) 5 MG tablet Take 1 tablet (5 mg total) by mouth at bedtime as needed for muscle spasms. (Patient not taking: Reported on 01/29/2020)  . fluticasone (FLONASE) 50 MCG/ACT nasal spray PLACE 2 SPRAYS INTO BOTH NOSTRILS AS NEEDED FOR ALLERGIES. (Patient not taking: Reported on 01/29/2020)  . rosuvastatin (CRESTOR) 10 MG tablet Take 1 tablet (10  mg total) by mouth daily. (Patient not taking: Reported on 01/29/2020)  . triamcinolone cream (KENALOG) 0.1 % APPLY 1 APPLICATION TOPICALLY 2 (TWO) TIMES DAILY. (Patient not taking: Reported on 01/29/2020)   No facility-administered encounter medications on file as of 01/29/2020.     Objective:   Goals Addressed              This Visit's Progress     Patient Stated   .  PharmD "I want to focus on natural supplements" (pt-stated)        CARE PLAN ENTRY (see longitudinal plan of care for additional care plan information)  Current Barriers:  . Social, financial, community barriers:  o Patient and husband have done extensive research on medications and side effects. Would prefer to minimize prescription medications, focus on natural supplements . Diabetes: uncontrolled; complicated by chronic medical conditions including HLD, GERD, most recent A1c 7.6% . Most recent eGFR: ~ 78 mL/min . Current antihyperglycemic regimen: berberine 500 mg TID, Trulicity 1.5 mg weekly o Self-d/c metformin d/t reported side effects- diarrhea, headache, fatigue. Has started berberine since then.  . Current meal patterns: o Has cut back on sugar/carbohydrate consumption since last A1c . Current exercise: Walks a few days per week, plans to increase  . Current blood glucose readings:  o Fasting: 130s-180s o After meals: 110-150s . Cardiovascular risk reduction: o Current hypertensive regimen: none o Current hyperlipidemia regimen: red yeast rice + CoQ 10. Had been on rosuvastatin 10 mg every other day, but read  about side effects (fatigue, headache, joint aches) and decided to discontinue. LDL was well controlled on previous regimen. Asks about alternative medications. Omega-3-faty acids. Reports her father and grandfather had cardiac issues, unsure ages and specific diagnoses.  o Current antiplatelet regimen: n/a . Eye exam: due . Nephropathy screening: up to date . Foot exam: due . GERD/hx Barrett's:  Aloe veral juice, apple cider gummies. D/c esomeprazole after reading about side effects.  Marland Kitchen Hx iron deficiency. Notes she took OTC iron but had constipation   Pharmacist Clinical Goal(s):  Marland Kitchen Over the next 90 days, patient will work with PharmD and primary care provider to address optimized medication management  Interventions: . Comprehensive medication review performed, medication list updated in electronic medical record . Inter-disciplinary care team collaboration (see longitudinal plan of care) . Extensive discussion regarding natural supplements vs prescription medications. Discussed that there is some data for use of berberine. Discussed more stringent A1c goal <7%. Check A1c today. Discussed that if not at goal, increase Trulicity vs SGLT2. Patient requests to defer medication changes at this time and instead focus on dietary and lifestyle changes. Discussed use of CGM to better evaluate impact of dietary choices. Patient interested. PCP to send script for [place] 2 today. . Discussed cholesterol goals and ASCVD risk reduction. Recommend lipid recheck today. If not at goal <70, discussed trial of rosuvastatin 5 mg. Patient requests alternative statin. Could trial pravastatin. Discussed that I would recommend trial of alternative statin vs non-statin alternatives at this time, given lack of long term ASCVD risk reduction. Will follow lab results.  . No significant drug interactions with berberine or aloe vera juice w/ current regimen. Avoid atorvastatin moving forward d/t 3A4 interaction w/ berberine.  Marland Kitchen PCP to refer back to GI for benefit vs risk discussion of PPI therapy.  . Recommend trial of Slow-Fe iron. Can also try every other day administration of iron.  Patient Self Care Activities:  . Patient will check glucose at least QID , document, and provide at future appointments . Patient will take medications as prescribed . Patient will report any questions or concerns to provider    Initial goal documentation         Plan:  - Scheduled f/u call in ~ 6 weeks  Catie Darnelle Maffucci, PharmD, [place], [place] Pharmacist [place] [phone]

## 2020-01-29 NOTE — Progress Notes (Signed)
Established Patient Office Visit  Subjective:  Patient ID: Whitney [name], female    DOB: May 19, 1960  Age: 59 y.o. MRN: [id number]  CC:  Chief Complaint  Patient presents with  . Follow-up    HPI Whitney [name] is a 59 yo who presents for routine f/up and discuss medication management for DM, HTN, HLD, GERD with Barrett's history.    Diabetes mellitus type 2: Patient discontinued her Metformin on 12/24/2019 due to multiple side effects: Diarrhea, headache, fatigue, body aches.  She has started berberine 500 mg 3 times daily and is taking it just like Metformin 30 minutes before meal.  She has remained on Trulicity 1.5 mg weekly.  She reports she feels much better off of Metformin.  Fasting blood sugar ranges 130s to 180s.  After meals range 110-150s.  She is eating healthier and has cut back on carbohydrates, sugars.  Most recent hemoglobin A1c 7.6- not goal.  GFR about 78 mL/min.  She has much more energy, and feels well.  Her brain fog has listed.  HLD: Off Crestor 01/10/20 and changed to Red yeast rice and Co q 10. She had HA, stiff joints, dizziness, appetite loss and constipation. All of these SE have resolved off of Crestor.  She is trying to eat much healthier, And started walking a couple times a week.  She used to have so much fatigue she could not walk but now she feels that she can increase her activity.  She is taking red yeast rice 600 mg capsules, omega-3 fatty acids 1200 mg daily, co-Q10 30 mg daily.  Most recent lipid LDL was under 70 at goal .  Due for repeat check on this new regimen.    BMI 29/Over wight: Dietary changes, exercise, weight is down. Wt Readings from Last 3 Encounters:  01/29/20 185 lb (83.9 kg)  12/24/19 186 lb (84.4 kg)  10/19/19 192 lb (87.1 kg)   GERD/history of Barrett's esophagus: Most recent EGD in 2015 did not show evidence of Barrett's esophagus.  She believes she is due for repeat EGD and possible colonoscopy.  She is followed by [facility].  She  has stopped her Nexium and is taking aloe vera juice to coat her stomach and apple cider vinegar helps with her reflux.  She does have to take Gaviscon about every other day to manage heartburn.  She had hiatal hernia repair.  Patient still gets reflux.  She does not want to take Nexium anymore because of the risk of side effects, and risk of worsening B12 deficiency.  Current B12 level is normal.  Iron deficiency anemia/thrombocytosis/leukocytosis: She has been followed by hematology, and after work-up, recommend monitoring.  She has had mildly low ferritin level.  Recommended increasing dietary iron.  We discussed on iron supplementation.  It gives her constipation.  She will try Slow Fe as it upsets his stomach less.  Even taking it every other day may be useful.  Immunizations: flu vaccine today. Shingrix at pharmacy Colonoscopy:Fit negative 11/26/19 Followed by [facility] Dexa:done by GYN 2021: osteopenia Pap Smear: GYN UTD Mammogram: 12/10/2019: normal   Past Medical History:  Diagnosis Date  . Allergy    Seasonal  . Anxiety   . Attention deficit disorder without mention of hyperactivity   . Barrett's esophagus   . CHOLELITHIASIS 05/26/2007   Qualifier: Diagnosis of  By: Maxie Better FNP, Rosalita Levan   . Congenital anomalies of foot, not elsewhere classified   . CONSTIPATION 10/09/2008   Qualifier: Diagnosis  of  By: Maxie Better FNP, Rosalita Levan   . Depression   . Diabetes mellitus without complication ([place])   . Displacement of intervertebral disc, site unspecified, without myelopathy   . DIVERTICULAR DISEASE 02/06/2002   Qualifier: Diagnosis of  By: Maxie Better FNP, Rosalita Levan   . Diverticulosis   . Elevated blood pressure reading without diagnosis of hypertension   . Esophageal reflux   . Gallstones   . Gastroenteritis 07/27/2016  . Hiatal hernia   . HIATAL HERNIA 10/16/2008   Qualifier: Diagnosis of  By: Maxie Better FNP, Rosalita Levan   . HLD (hyperlipidemia)   . Hypoglycemia,  unspecified   . Internal hemorrhoids without mention of complication   . Other abnormal blood chemistry     Past Surgical History:  Procedure Laterality Date  . BREAST CYST EXCISION Left   . CARDIAC CATHETERIZATION     [facility]  . CESAREAN SECTION    . CHOLECYSTECTOMY  06/16/2007   Dr. Hulen Skains  . COLONOSCOPY  02/06/2002   diverticula and internal hemorroids  . COLONOSCOPY  2015  . HIATAL HERNIA REPAIR    . LUMBAR LAMINECTOMY     L5-S1    Family History  Problem Relation Age of Onset  . Colon polyps Mother   . Hypertension Father   . Diabetes Father   . Heart disease Father   . Colon polyps Maternal Grandmother   . Heart attack Maternal Grandmother   . Heart attack Paternal Grandfather   . Diabetes Maternal Aunt   . Prostate cancer Maternal Grandfather   . Colon cancer Paternal Grandmother     Social History   Socioeconomic History  . Marital status: Married    Spouse name: Not on file  . Number of children: 2  . Years of education: Not on file  . Highest education level: Not on file  Occupational History  . Occupation: Self employeed- Conservation officer, historic buildings  Tobacco Use  . Smoking status: Former Smoker    Types: Cigarettes  . Smokeless tobacco: Never Used  . Tobacco comment: 2 years as a teenager  Vaping Use  . Vaping Use: Never used  Substance and Sexual Activity  . Alcohol use: Not Currently    Alcohol/week: 0.0 standard drinks  . Drug use: No  . Sexual activity: Yes  Other Topics Concern  . Not on file  Social History Narrative  . Not on file   Social Determinants of Health   Financial Resource Strain:   . Difficulty of Paying Living Expenses: Not on file  Food Insecurity:   . Worried About Charity fundraiser in the Last Year: Not on file  . Ran Out of Food in the Last Year: Not on file  Transportation Needs:   . Lack of Transportation (Medical): Not on file  . Lack of Transportation (Non-Medical): Not on file  Physical Activity:   . Days of Exercise  per Week: Not on file  . Minutes of Exercise per Session: Not on file  Stress:   . Feeling of Stress : Not on file  Social Connections:   . Frequency of Communication with Friends and Family: Not on file  . Frequency of Social Gatherings with Friends and Family: Not on file  . Attends Religious Services: Not on file  . Active Member of Clubs or Organizations: Not on file  . Attends Archivist Meetings: Not on file  . Marital Status: Not on file  Intimate Partner Violence:   . Fear of Current or Ex-Partner:  Not on file  . Emotionally Abused: Not on file  . Physically Abused: Not on file  . Sexually Abused: Not on file    Outpatient Medications Prior to Visit  Medication Sig Dispense Refill  . Cholecalciferol (VITAMIN D3) 50 MCG (2000 UT) capsule Take 2,000 Units by mouth daily.    . Coenzyme Q10 (COQ10) 30 MG CAPS Take by mouth.    . cyclobenzaprine (FLEXERIL) 5 MG tablet Take 1 tablet (5 mg total) by mouth at bedtime as needed for muscle spasms. (Patient not taking: Reported on 01/29/2020) 30 tablet 1  . fluticasone (FLONASE) 50 MCG/ACT nasal spray PLACE 2 SPRAYS INTO BOTH NOSTRILS AS NEEDED FOR ALLERGIES. (Patient not taking: Reported on 01/29/2020) 48 mL 1  . Omega-3 Fatty Acids (FISH OIL) 1200 MG CPDR Take 1,200 mg by mouth daily.     . Red Yeast Rice 600 MG CAPS Take by mouth.    . rosuvastatin (CRESTOR) 10 MG tablet Take 1 tablet (10 mg total) by mouth daily. (Patient not taking: Reported on 01/29/2020) 90 tablet 0  . triamcinolone cream (KENALOG) 0.1 % APPLY 1 APPLICATION TOPICALLY 2 (TWO) TIMES DAILY. (Patient not taking: Reported on 01/29/2020) 30 g 1  . TRULICITY 1.5 PF/7.9KW SOPN INJECT 0.5 MLS (1.5 MG TOTAL) INTO THE SKIN ONCE A WEEK FOR 28 DAYS. 6 mL 0  . Digestive Enzyme CAPS Take by mouth.    . Esomeprazole Magnesium (NEXIUM 24HR) 20 MG TBEC Take 40 capsules by mouth at bedtime.     Marland Kitchen glipiZIDE (GLUCOTROL) 5 MG tablet Take 1 tablet (5 mg total) by mouth daily. 60  tablet 3  . metFORMIN (GLUCOPHAGE) 1000 MG tablet TAKE 1 TABLET BY MOUTH TWICE DAILY WITH A MEAL FOR DIABETES. 180 tablet 3   No facility-administered medications prior to visit.    Allergies  Allergen Reactions  . Sertraline Hcl     REACTION: Headache/ vision changes  . Zoloft [Sertraline Hcl]     Same as Sertraline    Review of Systems Pertinent positives noted in history of present illness and otherwise negative.   Objective:    Physical Exam Vitals reviewed.  HENT:     Head: Normocephalic and atraumatic.  Eyes:     Conjunctiva/sclera: Conjunctivae normal.     Pupils: Pupils are equal, round, and reactive to light.  Cardiovascular:     Rate and Rhythm: Normal rate and regular rhythm.     Pulses: Normal pulses.     Heart sounds: Normal heart sounds.  Pulmonary:     Effort: Pulmonary effort is normal.     Breath sounds: Normal breath sounds.  Abdominal:     Palpations: Abdomen is soft.     Tenderness: There is no abdominal tenderness.  Musculoskeletal:        General: Normal range of motion.     Cervical back: Normal range of motion and neck supple.  Skin:    General: Skin is warm and dry.  Neurological:     General: No focal deficit present.     Mental Status: She is alert and oriented to person, place, and time.  Psychiatric:        Mood and Affect: Mood normal.        Behavior: Behavior normal.     BP 120/82 (BP Location: Left Arm, Patient Position: Sitting, Cuff Size: Normal)   Pulse 87   Temp (!) 97.4 F (36.3 C) (Oral)   Ht 5\' 7"  (1.702 m)   Wt 185 lb (  83.9 kg)   LMP 06/17/2011   SpO2 99%   BMI 28.98 kg/m  Wt Readings from Last 3 Encounters:  01/29/20 185 lb (83.9 kg)  12/24/19 186 lb (84.4 kg)  10/19/19 192 lb (87.1 kg)   Pulse Readings from Last 3 Encounters:  01/29/20 87  12/24/19 78  09/14/19 94    BP Readings from Last 3 Encounters:  01/29/20 120/82  12/24/19 (!) 151/106  09/14/19 122/78    Lab Results  Component Value Date    CHOL 146 09/14/2019   HDL 50.30 09/14/2019   LDLCALC 58 09/19/2018   LDLDIRECT 65.0 09/14/2019   TRIG 234.0 (H) 09/14/2019   CHOLHDL 3 09/14/2019      Health Maintenance Due  Topic Date Due  . HIV Screening  Never done  . PAP SMEAR-Modifier  11/27/2008  . OPHTHALMOLOGY EXAM  06/11/2018  . FOOT EXAM  09/19/2019    There are no preventive care reminders to display for this patient.  Lab Results  Component Value Date   TSH 1.47 05/29/2019   Lab Results  Component Value Date   WBC 11.1 (H) 07/06/2019   HGB 13.7 07/06/2019   HCT 42.4 07/06/2019   MCV 85.0 07/06/2019   PLT 366 07/06/2019   Lab Results  Component Value Date   NA 138 09/14/2019   K 4.9 09/14/2019   CO2 28 09/14/2019   GLUCOSE 196 (H) 09/14/2019   BUN 20 09/14/2019   CREATININE 0.76 09/14/2019   BILITOT 0.2 09/14/2019   ALKPHOS 75 09/14/2019   AST 17 09/14/2019   ALT 24 09/14/2019   PROT 6.2 09/14/2019   ALBUMIN 4.0 09/14/2019   CALCIUM 9.6 09/14/2019   ANIONGAP 11 08/17/2018   GFR 77.49 09/14/2019   Lab Results  Component Value Date   CHOL 146 09/14/2019   Lab Results  Component Value Date   HDL 50.30 09/14/2019   Lab Results  Component Value Date   LDLCALC 58 09/19/2018   Lab Results  Component Value Date   TRIG 234.0 (H) 09/14/2019   Lab Results  Component Value Date   CHOLHDL 3 09/14/2019   Lab Results  Component Value Date   HGBA1C 7.6 (H) 09/14/2019      Assessment & Plan:   Problem List Items Addressed This Visit      Endocrine   Type 2 diabetes mellitus ([place]) - Primary   Relevant Orders   Comprehensive metabolic panel   Hemoglobin A1c     Other   HLD (hyperlipidemia)   Relevant Orders   Lipid Profile   Anemia, iron deficiency   Relevant Orders   Iron, TIBC and Ferritin Panel   CBC with Differential/Platelet   Overweight (BMI 25.0-29.9)   History of Barrett's esophagus   Relevant Orders   Ambulatory referral to Gastroenterology    Other Visit Diagnoses      Need for immunization against influenza       Relevant Orders   Flu Vaccine QUAD 36+ mos IM (Completed)      Meds ordered this encounter  Medications  . Continuous Blood Gluc Sensor (FREESTYLE LIBRE 2 SENSOR) MISC    Sig: Apply to skin every 14 days for continuous blood glucose monitoring.    Dispense:  2 each    Refill:  2    Order Specific Question:   Supervising Provider    Answer:   Einar Pheasant [[id number]]   Labs today. For low iron: SLOW FE over the counter.   Diabetes plan: Will  check A1c and if not <7.0- will discuss increasing the Trulicity, monitoring blood sugar continuously with [facility] - information given. Catie in for education and medication management discussion with Whitney [name] and her husband. They bring in research about alternative medication   Check lipids- if not at goal of LDL is <70 then consider a lower dose of a different statin. FH: cardiovascular risk   Referral to GI for EGD and colonoscopy and discussion about Barrett's and whether you need to stay on Nexium.   You are due for a Shingrix vaccine- shingles and will get that at your pharmacy  Flu shot today  Follow-up: Return in about 3 months (around 04/30/2020) for for CPE.   This visit occurred during the SARS-CoV-2 public health emergency.  Safety protocols were in place, including screening questions prior to the visit, additional usage of staff PPE, and extensive cleaning of exam room while observing appropriate contact time as indicated for disinfecting solutions.   Denice Paradise, NP

## 2020-01-29 NOTE — Patient Instructions (Signed)
Visit Information  Goals Addressed              This Visit's Progress     Patient Stated   .  PharmD "I want to focus on natural supplements" (pt-stated)        CARE PLAN ENTRY (see longitudinal plan of care for additional care plan information)  Current Barriers:  . Social, financial, community barriers:  o Patient and husband have done extensive research on medications and side effects. Would prefer to minimize prescription medications, focus on natural supplements . Diabetes: uncontrolled; complicated by chronic medical conditions including HLD, GERD, most recent A1c 7.6% . Most recent eGFR: ~ 78 mL/min . Current antihyperglycemic regimen: berberine 704 mg TID, Trulicity 1.5 mg weekly o Self-d/c metformin d/t reported side effects- diarrhea, headache, fatigue. Has started berberine since then.  . Current meal patterns: o Has cut back on sugar/carbohydrate consumption since last A1c . Current exercise: Walks a few days per week, plans to increase  . Current blood glucose readings:  o Fasting: 130s-180s o After meals: 110-150s . Cardiovascular risk reduction: o Current hypertensive regimen: none o Current hyperlipidemia regimen: red yeast rice + CoQ 10. Had been on rosuvastatin 10 mg every other day, but read about side effects (fatigue, headache, joint aches) and decided to discontinue. LDL was well controlled on previous regimen. Asks about alternative medications. Omega-3-faty acids. Reports her father and grandfather had cardiac issues, unsure ages and specific diagnoses.  o Current antiplatelet regimen: n/a . Eye exam: due . Nephropathy screening: up to date . Foot exam: due . GERD/hx Barrett's: Aloe veral juice, apple cider gummies. D/c esomeprazole after reading about side effects.  Marland Kitchen Hx iron deficiency. Notes she took OTC iron but had constipation   Pharmacist Clinical Goal(s):  Marland Kitchen Over the next 90 days, patient will work with PharmD and primary care provider to  address optimized medication management  Interventions: . Comprehensive medication review performed, medication list updated in electronic medical record . Inter-disciplinary care team collaboration (see longitudinal plan of care) . Extensive discussion regarding natural supplements vs prescription medications. Discussed that there is some data for use of berberine. Discussed more stringent A1c goal <7%. Check A1c today. Discussed that if not at goal, increase Trulicity vs SGLT2. Patient requests to defer medication changes at this time and instead focus on dietary and lifestyle changes. Discussed use of CGM to better evaluate impact of dietary choices. Patient interested. PCP to send script for [place] 2 today. . Discussed cholesterol goals and ASCVD risk reduction. Recommend lipid recheck today. If not at goal <70, discussed trial of rosuvastatin 5 mg. Patient requests alternative statin. Could trial pravastatin. Discussed that I would recommend trial of alternative statin vs non-statin alternatives at this time, given lack of long term ASCVD risk reduction. Will follow lab results.  . No significant drug interactions with berberine or aloe vera juice w/ current regimen. Avoid atorvastatin moving forward d/t 3A4 interaction w/ berberine.  Marland Kitchen PCP to refer back to GI for benefit vs risk discussion of PPI therapy.  . Recommend trial of Slow-Fe iron. Can also try every other day administration of iron.  Patient Self Care Activities:  . Patient will check glucose at least QID , document, and provide at future appointments . Patient will take medications as prescribed . Patient will report any questions or concerns to provider   Initial goal documentation        The patient verbalized understanding of instructions provided today and declined a  print copy of patient instruction materials.   Plan:  - Scheduled f/u call in ~ 6 weeks  Catie Darnelle Maffucci, PharmD, [place], [place] Pharmacist [place] [phone]

## 2020-01-29 NOTE — Patient Instructions (Addendum)
Labs today to check CBC, Lipids, A1C,  Chemistry  For low iron: SLOW FE over the counter.   Diabetes plan: Will check A1c and if not <7.0- will discuss increasing the Trulicity, monitoring blood sugar continuously with [facility] - information given.   Check lipids- if not at goal of LDL is <70 then consider a lower dose of a different statin. FH: cardiovascular risk   Referral to GI for EGD and colonoscopy and discussion about Barrett's and whether you need to stay on Nexium.   You are due for a Shingrix vaccine- shingles and will get that at your pharmacy  Flu shot today  Follow up in 3 months

## 2020-01-30 LAB — CBC WITH DIFFERENTIAL/PLATELET
Basophils Absolute: 0.1 10*3/uL (ref 0.0–0.2)
Basos: 1 %
EOS (ABSOLUTE): 0.1 10*3/uL (ref 0.0–0.4)
Eos: 1 %
Hematocrit: 41.8 % (ref 34.0–46.6)
Hemoglobin: 13.9 g/dL (ref 11.1–15.9)
Immature Grans (Abs): 0 10*3/uL (ref 0.0–0.1)
Immature Granulocytes: 0 %
Lymphocytes Absolute: 1.9 10*3/uL (ref 0.7–3.1)
Lymphs: 26 %
MCH: 27.9 pg (ref 26.6–33.0)
MCHC: 33.3 g/dL (ref 31.5–35.7)
MCV: 84 fL (ref 79–97)
Monocytes Absolute: 0.6 10*3/uL (ref 0.1–0.9)
Monocytes: 8 %
Neutrophils Absolute: 4.7 10*3/uL (ref 1.4–7.0)
Neutrophils: 64 %
Platelets: 501 10*3/uL — ABNORMAL HIGH (ref 150–450)
RBC: 4.98 x10E6/uL (ref 3.77–5.28)
RDW: 13.8 % (ref 11.7–15.4)
WBC: 7.4 10*3/uL (ref 3.4–10.8)

## 2020-01-30 LAB — COMPREHENSIVE METABOLIC PANEL
ALT: 20 IU/L (ref 0–32)
AST: 15 IU/L (ref 0–40)
Albumin/Globulin Ratio: 1.8 (ref 1.2–2.2)
Albumin: 4.4 g/dL (ref 3.8–4.9)
Alkaline Phosphatase: 98 IU/L (ref 44–121)
BUN/Creatinine Ratio: 14 (ref 12–28)
BUN: 11 mg/dL (ref 8–27)
Bilirubin Total: <0.2 mg/dL (ref 0.0–1.2)
CO2: 25 mmol/L (ref 20–29)
Calcium: 9.7 mg/dL (ref 8.7–10.3)
Chloride: 101 mmol/L (ref 96–106)
Creatinine, Ser: 0.77 mg/dL (ref 0.57–1.00)
GFR calc Af Amer: 97 mL/min/{1.73_m2} (ref >59)
GFR calc non Af Amer: 84 mL/min/{1.73_m2} (ref >59)
Globulin, Total: 2.5 g/dL (ref 1.5–4.5)
Glucose: 128 mg/dL — ABNORMAL HIGH (ref 65–99)
Potassium: 4.4 mmol/L (ref 3.5–5.2)
Sodium: 139 mmol/L (ref 134–144)
Total Protein: 6.9 g/dL (ref 6.0–8.5)

## 2020-01-30 LAB — LIPID PANEL
Chol/HDL Ratio: 4.5 ratio — ABNORMAL HIGH (ref 0.0–4.4)
Cholesterol, Total: 218 mg/dL — ABNORMAL HIGH (ref 100–199)
HDL: 48 mg/dL (ref >39)
LDL Chol Calc (NIH): 140 mg/dL — ABNORMAL HIGH (ref 0–99)
Triglycerides: 169 mg/dL — ABNORMAL HIGH (ref 0–149)
VLDL Cholesterol Cal: 30 mg/dL (ref 5–40)

## 2020-01-30 LAB — IRON,TIBC AND FERRITIN PANEL
Ferritin: 12 ng/mL — ABNORMAL LOW (ref 15–150)
Iron Saturation: 10 % — ABNORMAL LOW (ref 15–55)
Iron: 48 ug/dL (ref 27–159)
Total Iron Binding Capacity: 472 ug/dL — ABNORMAL HIGH (ref 250–450)
UIBC: 424 ug/dL (ref 131–425)

## 2020-01-30 LAB — HEMOGLOBIN A1C
Est. average glucose Bld gHb Est-mCnc: 169 mg/dL
Hgb A1c MFr Bld: 7.5 % — ABNORMAL HIGH (ref 4.8–5.6)

## 2020-01-31 ENCOUNTER — Encounter: Payer: Self-pay | Admitting: Nurse Practitioner

## 2020-02-07 ENCOUNTER — Encounter: Payer: Self-pay | Admitting: Nurse Practitioner

## 2020-02-07 DIAGNOSIS — E785 Hyperlipidemia, unspecified: Secondary | ICD-10-CM

## 2020-02-12 MED ORDER — PRAVASTATIN SODIUM 20 MG PO TABS: 20.0000 mg | ORAL_TABLET | Freq: Every day | ORAL | 3 refills | Status: DC

## 2020-02-12 NOTE — Telephone Encounter (Signed)
She is in agreement to try pravastatin 20 mg daily. She is also on red yeast rice. Needs a lab appt needed for 04/08/2019.

## 2020-02-13 ENCOUNTER — Encounter: Payer: Self-pay | Admitting: Nurse Practitioner

## 2020-02-13 ENCOUNTER — Ambulatory Visit: Payer: 59 | Admitting: Nurse Practitioner

## 2020-02-13 VITALS — BP 110/72 | HR 86 | Ht 67.0 in | Wt 186.0 lb

## 2020-02-13 DIAGNOSIS — Z800 Family history of malignant neoplasm of digestive organs: Secondary | ICD-10-CM | POA: Diagnosis not present

## 2020-02-13 DIAGNOSIS — Z8719 Personal history of other diseases of the digestive system: Secondary | ICD-10-CM

## 2020-02-13 DIAGNOSIS — Z86010 Personal history of colonic polyps: Secondary | ICD-10-CM

## 2020-02-13 DIAGNOSIS — R1310 Dysphagia, unspecified: Secondary | ICD-10-CM | POA: Diagnosis not present

## 2020-02-13 MED ORDER — PLENVU 140 G PO SOLR: 1.0000 | Freq: Once | ORAL | 0 refills | Status: AC

## 2020-02-13 MED ORDER — FAMOTIDINE 20 MG PO TABS: 20.0000 mg | ORAL_TABLET | Freq: Every day | ORAL | 6 refills | Status: DC | PRN

## 2020-02-13 NOTE — Progress Notes (Signed)
02/13/2020 Whitney [name] [id number] [birth year]   CHIEF COMPLAINT: Schedule EGD and colonoscopy   HISTORY OF PRESENT ILLNESS:  Whitney [name] is a 59 year old female with a past medical history of anxiety, DM II 2016, diverticulosis, constipation, GERD and Barrett's esophagus.  She presents to our office as referred by her PCP to schedule a colonoscopy.  S/P cholecystectomy in 2009. Complex paraesophageal hernia s/p Nissen Fundoplication in 6283.  Her most recent colonoscopy was 11/21/2013 and 1 hyperplastic polyp was removed from the sigmoid colon, the colon was redundant.  Colonoscopy in 2003 showed diverticulosis without colon polyps.  Paternal grandmother with history of colon cancer.  Reports passing a normal formed brown bowel movement daily.  She denies having any rectal bleeding or black stools. History of GERD and Barrett's esophagus per EGD in 2010.  Her most recent EGD was 11/21/2013 which showed GERD without evidence of Barrett's esophagus. She has heartburn if she eats spicy, fatty foods or lettuce. She otherwise has heartburn 3 days weekly. She has some difficulty swallowing solid food like chicken, which causes pain as it passes slowly down the esophagus she is chronic.  She has no difficulty swallowing liquids or soft foods.  He was previously taking Nexium daily since 2010, however, she weaned herself off of the Nexium 2 to 3 months ago she was concerned about long-term PPI use.  Underwent esophageal manometry due to dysphagia 12/23/2008 which showed a normal lower esophageal sphincter and some peristolic contractions.  She denies having any nausea or vomiting.  No upper or lower abdominal pain.  She intentionally lost 20 pounds over the past 4 months by diet changes.  Infrequent NSAID use.  No other complaints today.  Labs 01/29/2020: WBC 7.4.  Hemoglobin 13.9.  Hematocrit 41.8.  Platelet 501.  Iron 48.  Iron saturation 10.  Ferritin 12.  Glucose 128.  BUN 11.  Creatinine 0.77.  Sodium  139.  Potassium 4.4.  Total bili 0.2.  Alk phos 98.  AST 15.  ALT 20.  EGD 11/21/2013 Dr. Olevia Perches: 1. small reducible hernia 2. is from squamocolumnar Junction to followup on Barrett's esophagus  Colonoscopy 11/21/2013 by Dr. Olevia Perches: 1.Sessile polyp ranging between 3-69m in size was found in the sigmoid colon; polypectomy was performed with cold forceps 2. long redundant colon. Difficult exam. 3. low volume hematochezia is likely from a rectal source Biopsy Report: 1. Surgical [P], GE junction, biopsy - GASTROESOPHAGEAL JUNCTION MUCOSA WITH MILD INFLAMMATION CONSISTENT WITH GASTROESOPHAGEAL REFLUX. NO INTESTINAL METAPLASIA, DYSPLASIA OR MALIGNANCY IDENTIFIED. 2. Surgical [P], at 15 cm, biopsy - HYPERPLASTIC POLYP(S). NO ADENOMATOUS CHANGE OR MALIGNANCY IDENTIFIED.  Esophageal manometry 12/23/2008: Showed a normal lower esophageal sphincter, 6/8 peristaltic contractions, one simultaneous contraction and one retrograde contraction.   EGD 10/14/2008: Hiatal hernia Esophagitis in the distal esophagus Otherwise normal examination Status post biopsies  Biopsy Report: GE junction to rule out Barrett's esophagus: I suspect the dysphagia is due to dysmotility. ESOPHAGUS: INTESTINAL METAPLASIA (GOBLET CELL METAPLASIA) CONSISTENT WITH BARRETT' S ESOPHAGUS.  NO DYSPLASIA OR MALIGNANCY IDENTIFIED.   EGD May 2006 Patient participated in a clinical study on gastroesophageal reflux.  EGD by Dr. KDeatra Ina was reported as normal.    Colonoscopy 02/06/2002: Sigmoid diverticulosis Internal hemorrhoids  Past Medical History:  Diagnosis Date  . Allergy    Seasonal  . Anxiety   . Attention deficit disorder without mention of hyperactivity   . Barrett's esophagus   . CHOLELITHIASIS 05/26/2007   Qualifier: Diagnosis of  By:  Bean FNP, Rosalita Levan   . Congenital anomalies of foot, not elsewhere classified   . CONSTIPATION 10/09/2008   Qualifier: Diagnosis of  By: Maxie Better FNP, Rosalita Levan   . Depression   . Diabetes mellitus without complication ([place])   . Displacement of intervertebral disc, site unspecified, without myelopathy   . DIVERTICULAR DISEASE 02/06/2002   Qualifier: Diagnosis of  By: Maxie Better FNP, Rosalita Levan   . Diverticulosis   . Elevated blood pressure reading without diagnosis of hypertension   . Esophageal reflux   . Gallstones   . Gastroenteritis 07/27/2016  . Hiatal hernia   . HIATAL HERNIA 10/16/2008   Qualifier: Diagnosis of  By: Maxie Better FNP, Rosalita Levan   . HLD (hyperlipidemia)   . Hypoglycemia, unspecified   . Internal hemorrhoids without mention of complication   . Other abnormal blood chemistry    Past Surgical History:  Procedure Laterality Date  . BREAST CYST EXCISION Left   . CARDIAC CATHETERIZATION     [facility]  . CESAREAN SECTION    . CHOLECYSTECTOMY  06/16/2007   Dr. Hulen Skains  . COLONOSCOPY  02/06/2002   diverticula and internal hemorroids  . COLONOSCOPY  2015  . HIATAL HERNIA REPAIR    . LUMBAR LAMINECTOMY     L5-S1   Social History: Married. She has one son and one daughter. She smoked cigarettes during high school. No alcohol. No drug use.   Family History: Paternal grandmother with history of colon cancer. Mother age 47 with history of colon polyps. Father died age 93 from [place]. Sister healthy.   Allergies  Allergen Reactions  . Sertraline Hcl     REACTION: Headache/ vision changes  . Zoloft [Sertraline Hcl]     Same as Sertraline      Outpatient Encounter Medications as of 02/13/2020  Medication Sig  . ALOE VERA JUICE PO Take by mouth. Using for GI stomach  . Barberry-Oreg Grape-Goldenseal (BERBERINE COMPLEX PO) Take 500 mg by mouth in the morning, at noon, and at bedtime.  . Cholecalciferol (VITAMIN D3) 50 MCG (2000 UT) capsule Take 2,000 Units by mouth daily.  . Coenzyme Q10 (COQ10) 30 MG CAPS Take by mouth.  . Continuous Blood Gluc Sensor (FREESTYLE LIBRE 2 SENSOR) MISC Apply to skin every 14 days  for continuous blood glucose monitoring.  . cyclobenzaprine (FLEXERIL) 5 MG tablet Take 1 tablet (5 mg total) by mouth at bedtime as needed for muscle spasms. (Patient not taking: Reported on 01/29/2020)  . fluticasone (FLONASE) 50 MCG/ACT nasal spray PLACE 2 SPRAYS INTO BOTH NOSTRILS AS NEEDED FOR ALLERGIES. (Patient not taking: Reported on 01/29/2020)  . Omega-3 Fatty Acids (FISH OIL) 1200 MG CPDR Take 1,200 mg by mouth daily.   . pravastatin (PRAVACHOL) 20 MG tablet Take 1 tablet (20 mg total) by mouth daily.  . Red Yeast Rice 600 MG CAPS Take by mouth.  . triamcinolone cream (KENALOG) 0.1 % APPLY 1 APPLICATION TOPICALLY 2 (TWO) TIMES DAILY. (Patient not taking: Reported on 01/29/2020)  . TRULICITY 1.5 BE/6.7JQ SOPN INJECT 0.5 MLS (1.5 MG TOTAL) INTO THE SKIN ONCE A WEEK FOR 28 DAYS.   No facility-administered encounter medications on file as of 02/13/2020.    REVIEW OF SYSTEMS:   Gen: Denies fever, sweats or chills. No weight loss.  CV: Denies chest pain, palpitations or edema. Resp: Denies cough, shortness of breath of hemoptysis.  GI: See HPI.  GU : Denies urinary burning, blood in urine, increased urinary frequency or incontinence. MS: Denies joint  pain, muscles aches or weakness. Derm: Denies rash, itchiness, skin lesions or unhealing ulcers. Psych: Denies depression, anxiety or memory loss.  Heme: Denies bruising, bleeding. Neuro:  Denies headaches, dizziness or paresthesias. Endo:  + DM II   PHYSICAL EXAM: LMP 06/17/2011  General: Well developed 59 year old female in no acute distress. Head: Normocephalic and atraumatic. Eyes:  Sclerae non-icteric, conjunctive pink. Ears: Normal auditory acuity. Mouth: Dentition intact. No ulcers or lesions.  Neck: Supple, no lymphadenopathy or thyromegaly.  Lungs: Clear bilaterally to auscultation without wheezes, crackles or rhonchi. Heart: Regular rate and rhythm. No murmur, rub or gallop appreciated.  Abdomen: Soft, nontender, non  distended. No masses. No hepatosplenomegaly. Normoactive bowel sounds x 4 quadrants. Laparoscopic scars intact.  Rectal: Deferred.  Musculoskeletal: Symmetrical with no gross deformities. Skin: Warm and dry. No rash or lesions on visible extremities. Extremities: No edema. Neurological: Alert oriented x 4, no focal deficits.  Psychological:  Alert and cooperative. Normal mood and affect.  ASSESSMENT AND PLAN:  73.  59 year old female with a history of hyperplastic colon polyp.  Family history of colon cancer. -Colonoscopy benefits and risks discussed including risk with sedation, risk of bleeding, perforation and infection   2.  History of GERD with past Barrett's esophagus.  S/P paraesophageal hernia repair/Nissen Fundoplication 4483. Most recent EGD in 2015 without evidence of Barrett's esophagus. -EGD benefits and risks discussed including risk with sedation, risk of bleeding, perforation and infection  -Famotidine 64m QD as needed  3.  Iron deficiency.  FOBT negative 11/28/2019. -EGD and colonoscopy as ordered above  4. DM II  Further recommendations to be determined after EGD and colonoscopy completed         CC:  MMarval Regal NP

## 2020-02-13 NOTE — Progress Notes (Signed)
Agree with assessment and plan as outlined.  

## 2020-02-13 NOTE — Patient Instructions (Signed)
We have sent the following medications to your pharmacy for you to pick up at your convenience:  Famotidine  You have been scheduled for an endoscopy and colonoscopy. Please follow the written instructions given to you at your visit today. Please pick up your prep supplies at the pharmacy within the next 1-3 days. If you use inhalers (even only as needed), please bring them with you on the day of your procedure.

## 2020-02-18 ENCOUNTER — Other Ambulatory Visit: Payer: Self-pay

## 2020-02-18 MED ORDER — FREESTYLE LITE DEVI
0 refills | Status: DC
Start: 2020-02-18 — End: 2022-04-12

## 2020-02-19 ENCOUNTER — Encounter: Payer: Self-pay | Admitting: Nurse Practitioner

## 2020-02-20 MED ORDER — FREESTYLE LIBRE 2 READER DEVI: 0 refills | Status: DC

## 2020-03-12 ENCOUNTER — Ambulatory Visit: Payer: 59 | Admitting: Pharmacist

## 2020-03-12 DIAGNOSIS — E119 Type 2 diabetes mellitus without complications: Secondary | ICD-10-CM

## 2020-03-12 DIAGNOSIS — Z8719 Personal history of other diseases of the digestive system: Secondary | ICD-10-CM

## 2020-03-12 DIAGNOSIS — E785 Hyperlipidemia, unspecified: Secondary | ICD-10-CM

## 2020-03-12 NOTE — Chronic Care Management (AMB) (Signed)
**Note Whitney-Identified via Obfuscation** Care Management   Pharmacy Note  03/12/2020 Name: Whitney [name] MRN: [id number] DOB: 1960-08-22   Subjective:  Whitney [name] is a 59 y.o. year old female who is a primary care patient of Marval Regal, NP. The Care Management/Care Coordination team team was consulted for assistance with care management and care coordination needs.    Engaged with patient by telephone for follow up visit in response to provider referral for pharmacy case management and/or care coordination services.   Consent to Services:  Whitney [name] was given information about care management/care coordination services, agreed to services, and gave verbal consent prior to initiation of services. Please see initial visit note for detailed documentation.   Review of patient status, including review of consultants reports, laboratory and other test data, was performed as part of comprehensive evaluation and provision of chronic care management services.   SDOH (Social Determinants of Health) assessments and interventions performed:  SDOH Interventions   Flowsheet Row Most Recent Value  SDOH Interventions   Financial Strain Interventions Intervention Not Indicated       Objective:  Lab Results  Component Value Date   CREATININE 0.77 01/29/2020   CREATININE 0.76 09/14/2019   CREATININE 0.73 05/29/2019    Lab Results  Component Value Date   HGBA1C 7.5 (H) 01/29/2020       Component Value Date/Time   CHOL 218 (H) 01/29/2020 1107   TRIG 169 (H) 01/29/2020 1107   HDL 48 01/29/2020 1107   CHOLHDL 4.5 (H) 01/29/2020 1107   CHOLHDL 3 09/14/2019 1014   VLDL 46.8 (H) 09/14/2019 1014   LDLCALC 140 (H) 01/29/2020 1107   LDLDIRECT 65.0 09/14/2019 1014     BP Readings from Last 3 Encounters:  02/13/20 110/72  01/29/20 120/82  12/24/19 (!) 151/106    Assessment:   Allergies  Allergen Reactions  . Sertraline Hcl     REACTION: Headache/ vision changes  . Zoloft [Sertraline Hcl]     Same as Sertraline     Medications Reviewed Today    Reviewed by Whitney [name], Whitney [name] (Pharmacist) on 03/12/20 at [phone]  Med List Status: <None>  Medication Order Taking? Sig Documenting Provider Last Dose Status Informant  ALOE VERA JUICE PO [id number] Yes Take by mouth. Gel; Using for GI stomach [provider] Taking Active   Barberry-Oreg Grape-Goldenseal (BERBERINE COMPLEX PO) [id number] Yes Take 500 mg by mouth in the morning, at noon, and at bedtime. [provider] Taking Active   Blood Glucose Monitoring Suppl (FREESTYLE LITE) DEVI [id number] Yes Use as directed to check blood sugars Marval Regal, NP Taking Active   Cholecalciferol (VITAMIN D3) 50 MCG (2000 UT) capsule [id number] Yes Take 2,000 Units by mouth daily. [provider] Taking Active   Coenzyme Q10 (COQ10) 30 MG CAPS [id number] Yes Take by mouth. [provider] Taking Active   Continuous Blood Gluc Receiver (FREESTYLE LIBRE 2 READER) DEVI [id number] Yes Use as directed to check blood sugars. Dx E11.9 Marval Regal, NP Taking Active   Continuous Blood Gluc Sensor (FREESTYLE LIBRE 2 SENSOR) [place] [id number] Yes Apply to skin every 14 days for continuous blood glucose monitoring. Marval Regal, NP Taking Active   cyclobenzaprine (FLEXERIL) 5 MG tablet [id number] Yes Take 1 tablet (5 mg total) by mouth at bedtime as needed for muscle spasms. Marval Regal, NP Taking Active   famotidine (PEPCID) 20 MG tablet [id number] Yes Take 1 tablet (20 mg total) by mouth daily as needed for heartburn  or indigestion. Noralyn Pick, NP Taking Active   fluticasone (FLONASE) 50 MCG/ACT nasal spray [id number] Yes PLACE 2 SPRAYS INTO BOTH NOSTRILS AS NEEDED FOR ALLERGIES. Marval Regal, NP Taking Expired 03/06/20 2359   Omega-3 Fatty Acids (FISH OIL) 1200 MG CPDR [id number] Yes Take 1,200 mg by mouth daily.  [provider] Taking Active Self  pravastatin (PRAVACHOL) 20 MG tablet [id number] Yes Take  1 tablet (20 mg total) by mouth daily. Marval Regal, NP Taking Active   Red Yeast Rice 600 MG CAPS [id number] Yes Take by mouth. [provider] Taking Active   triamcinolone cream (KENALOG) 0.1 % [id number] Yes APPLY 1 APPLICATION TOPICALLY 2 (TWO) TIMES DAILY. Lucille Passy, MD Taking Active Self  TRULICITY 1.5 JJ/0.0XF Bonney Aid [id number] Yes INJECT 0.5 MLS (1.5 MG TOTAL) INTO THE SKIN ONCE A WEEK FOR 28 DAYS. Marval Regal, NP Taking Active           Patient Active Problem List   Diagnosis Date Noted  . DDD (degenerative disc disease), lumbar 10/19/2019  . Overweight (BMI 25.0-29.9) 09/15/2019  . History of Barrett's esophagus 09/15/2019  . Frequent headaches 10/14/2015  . Fatigue 05/05/2015  . Anemia, iron deficiency 12/30/2014  . Type 2 diabetes mellitus ([place]) 10/15/2014  . Microcytic anemia 10/15/2014  . HLD (hyperlipidemia) 10/09/2008  . G E R D 09/27/2006    Medication Assistance: None required. Patient affirms current coverage meets needs.   Patient Care Plan: Medication Management    Problem Identified: Diabetes, HLD     Long-Range Goal: Disease Progression Prevention   This Visit's Progress: On track  Priority: High  Note:   . Current Barriers:  . Unable to achieve control of diabetes, hyperlipidemia  . Concern regarding prescription medications, preference for natural supplements   Pharmacist Clinical Goal(s):  Marland Kitchen Over the next 90 days, patient will achieve adherence to monitoring guidelines and medication adherence to achieve therapeutic efficacy   Interventions: . Inter-disciplinary care team collaboration (see longitudinal plan of care) . Comprehensive medication review performed; medication list updated in electronic medical record  Health Management: . Reviewed need to establish care with new PCP for chronic disease management. Discussed [employer].   Diabetes: . Uncontrolled; current treatment: Trulicity 1.5 mg weekly;  patient is also taking berberine 500 mg TID; notes that she had a re-start of "joint stiffness", and decided to hold her dose of Trulicity this past week to see if resolution o Hx metformin, but discontinued d/t concern for causing diarrhea, headache, joint paints. Reports resolution of those symptoms since metformin d/c  . Current glucose readings: using Libre 2 CGM cash price Date of Download: 03/12/20 Average Glucose: 157 mg/dL - 12 am -6 am: 143 - 6 am - 12 pm: 179 - 12 pm-6 pmL 153 - 6 pm- 12 am: 154  . Recommended to restart Trulicity, as unlikely that joint pain is caused by this medication . Discussed alternative options, such as SGLT2. Patient notes that she has not been as focused on diet recently; would like to defer changes and continue to follow at this time.  . Continue current regimen. Fasting lab f/u in Jan  Hyperlipidemia: . Uncontrolled; current treatment: pravastatin 20 mg daily + red yeast rice + CoQ10.   . Medications previously tried: rosuvastatin; reported joint pains . Discussed that "joint stiffness" not typical statin associated muscle symptoms, but since she associates the start of symptoms with the start of pravastatin, appropriate to hold pravastatin x 1 week.  If resolution of symptoms, restart pravastatin. If not, continue to hold and we will discuss alternative cholesterol treatment/ASCVD risk reduction strategies moving forward  GERD/hx Barrett's: . Controlled; current regimen: famotidine 20 mg PRN per GI. Patient had previously been on PPI, but was concerned about long term risks. GI noted that H2RA PRN was appropriate. Upcoming colonoscopy . Continue current regimen and follow up with GI   Patient Goals/Self-Care Activities . Over the next 90 days, patient will:  - take medications as prescribed check glucose using CGM, document, and provide at future appointments  Follow Up Plan: Telephone follow up appointment with care management team member scheduled  for: 4 weeks      Plan: Telephone follow up appointment with care management team member scheduled for:  Catie Darnelle Maffucci, PharmD, [place], CPP Clinical Pharmacist [employer] at [facility], [place] [phone]

## 2020-03-12 NOTE — Patient Instructions (Signed)
Ms. Stanzione,   It was great talking to you today!  Restart Trulicity. You can try to hold pravastatin for a week to see if joint stiffness resolves. Again, many things (like arthritis) can cause joint stiffness more so than either of these classes of medications, but as you associate the resurgence of symptoms with restarting these medications, I feel it's appropriate to try. If your joint stiffness does not resolve with pravastatin discontinuation, it's likely something else, so restart the pravastatin and discuss the stiffness with your next PCP.   Plan on establishing care with a new provider. [facility] is accepting new patients [phone]).   Take care!  Catie Darnelle Maffucci, PharmD [phone]  Visit Information  Patient Care Plan: Medication Management    Problem Identified: Diabetes, HLD     Long-Range Goal: Disease Progression Prevention   This Visit's Progress: On track  Priority: High  Note:    Current Barriers:   Unable to achieve control of diabetes, hyperlipidemia   Concern regarding prescription medications, preference for natural supplements   Pharmacist Clinical Goal(s):   Over the next 90 days, patient will achieve adherence to monitoring guidelines and medication adherence to achieve therapeutic efficacy   Interventions:  Inter-disciplinary care team collaboration (see longitudinal plan of care)  Comprehensive medication review performed; medication list updated in electronic medical record  Health Management:  Reviewed need to establish care with new PCP for chronic disease management. Discussed [employer].   Diabetes:  Uncontrolled; current treatment: Trulicity 1.5 mg weekly; patient is also taking berberine 500 mg TID; notes that she had a re-start of "joint stiffness", and decided to hold her dose of Trulicity this past week to see if resolution o Hx metformin, but discontinued d/t concern for causing diarrhea, headache,  joint paints. Reports resolution of those symptoms since metformin d/c   Current glucose readings: using Libre 2 CGM cash price Date of Download: 03/12/20 Average Glucose: 157 mg/dL - 12 am -6 am: 143 - 6 am - 12 pm: 179 - 12 pm-6 pmL 153 - 6 pm- 12 am: 154   Recommended to restart Trulicity, as unlikely that joint pain is caused by this medication . Discussed alternative options, such as SGLT2. Patient notes that she has not been as focused on diet recently; would like to defer changes and continue to follow at this time.   Continue current regimen. Fasting lab f/u in Jan  Hyperlipidemia:  Uncontrolled; current treatment: pravastatin 20 mg daily + red yeast rice + CoQ10.    Medications previously tried: rosuvastatin; reported joint pains  Discussed that "joint stiffness" not typical statin associated muscle symptoms, but since she associates the start of symptoms with the start of pravastatin, appropriate to hold pravastatin x 1 week. If resolution of symptoms, restart pravastatin. If not, continue to hold and we will discuss alternative cholesterol treatment/ASCVD risk reduction strategies moving forward  GERD/hx Barrett's:  Controlled; current regimen: famotidine 20 mg PRN per GI. Patient had previously been on PPI, but was concerned about long term risks. GI noted that H2RA PRN was appropriate. Upcoming colonoscopy  Continue current regimen and follow up with GI   Patient Goals/Self-Care Activities  Over the next 90 days, patient will:  - take medications as prescribed check glucose using CGM, document, and provide at future appointments  Follow Up Plan: Telephone follow up appointment with care management team member scheduled for: 4 weeks      The patient verbalized understanding of instructions, educational materials, and care  plan provided today and agreed to receive a mailed copy of patient instructions, educational materials, and care plan.   Plan: Telephone follow  up appointment with care management team member scheduled for:  Catie Darnelle Maffucci, PharmD, [place], [place] Clinical Pharmacist [employer] at [facility] [phone]

## 2020-03-29 HISTORY — PX: COLONOSCOPY: SHX174

## 2020-03-29 HISTORY — PX: ESOPHAGOGASTRODUODENOSCOPY: SHX1529

## 2020-04-07 ENCOUNTER — Other Ambulatory Visit (INDEPENDENT_AMBULATORY_CARE_PROVIDER_SITE_OTHER): Payer: 59

## 2020-04-07 DIAGNOSIS — E785 Hyperlipidemia, unspecified: Secondary | ICD-10-CM

## 2020-04-07 LAB — LIPID PANEL
Cholesterol: 212 mg/dL — ABNORMAL HIGH (ref <200)
HDL: 41 mg/dL — ABNORMAL LOW (ref >=50)
LDL Cholesterol (Calc): 136 mg/dL (calc) — ABNORMAL HIGH
Non-HDL Cholesterol (Calc): 171 mg/dL (calc) — ABNORMAL HIGH (ref <130)
Total CHOL/HDL Ratio: 5.2 (calc) — ABNORMAL HIGH (ref <5.0)
Triglycerides: 210 mg/dL — ABNORMAL HIGH (ref <150)

## 2020-04-07 LAB — HEPATIC FUNCTION PANEL
AG Ratio: 1.8 (calc) (ref 1.0–2.5)
ALT: 11 U/L (ref 6–29)
AST: 12 U/L (ref 10–35)
Albumin: 4.1 g/dL (ref 3.6–5.1)
Alkaline phosphatase (APISO): 107 U/L (ref 37–153)
Bilirubin, Direct: 0.1 mg/dL (ref 0.0–0.2)
Globulin: 2.3 g/dL (calc) (ref 1.9–3.7)
Indirect Bilirubin: 0.2 mg/dL (calc) (ref 0.2–1.2)
Total Bilirubin: 0.3 mg/dL (ref 0.2–1.2)
Total Protein: 6.4 g/dL (ref 6.1–8.1)

## 2020-04-10 ENCOUNTER — Telehealth: Payer: Self-pay | Admitting: Nurse Practitioner

## 2020-04-10 NOTE — Telephone Encounter (Signed)
Pt states her insurance will not cover the PLENVU medication, pt would like a call back to further discuss other options.

## 2020-04-10 NOTE — Telephone Encounter (Signed)
Spoke with patient and told her we would put a Plenvu sample up front to be picked up.  Patient agreed

## 2020-04-11 ENCOUNTER — Ambulatory Visit (AMBULATORY_SURGERY_CENTER): Payer: 59 | Admitting: Gastroenterology

## 2020-04-11 ENCOUNTER — Encounter: Payer: Self-pay | Admitting: Gastroenterology

## 2020-04-11 ENCOUNTER — Other Ambulatory Visit: Payer: Self-pay

## 2020-04-11 VITALS — BP 127/80 | HR 85 | Temp 97.1°F | Resp 27 | Ht 67.0 in | Wt 186.0 lb

## 2020-04-11 DIAGNOSIS — K2100 Gastro-esophageal reflux disease with esophagitis, without bleeding: Secondary | ICD-10-CM

## 2020-04-11 DIAGNOSIS — E611 Iron deficiency: Secondary | ICD-10-CM

## 2020-04-11 DIAGNOSIS — K319 Disease of stomach and duodenum, unspecified: Secondary | ICD-10-CM

## 2020-04-11 DIAGNOSIS — K317 Polyp of stomach and duodenum: Secondary | ICD-10-CM

## 2020-04-11 DIAGNOSIS — K5730 Diverticulosis of large intestine without perforation or abscess without bleeding: Secondary | ICD-10-CM

## 2020-04-11 DIAGNOSIS — K449 Diaphragmatic hernia without obstruction or gangrene: Secondary | ICD-10-CM | POA: Diagnosis not present

## 2020-04-11 DIAGNOSIS — R1310 Dysphagia, unspecified: Secondary | ICD-10-CM

## 2020-04-11 DIAGNOSIS — K648 Other hemorrhoids: Secondary | ICD-10-CM

## 2020-04-11 DIAGNOSIS — K3189 Other diseases of stomach and duodenum: Secondary | ICD-10-CM

## 2020-04-11 DIAGNOSIS — Z8719 Personal history of other diseases of the digestive system: Secondary | ICD-10-CM

## 2020-04-11 MED ORDER — SODIUM CHLORIDE 0.9 % IV SOLN: 500.0000 mL | INTRAVENOUS | Status: DC

## 2020-04-11 MED ORDER — OMEPRAZOLE 40 MG PO CPDR: 40.0000 mg | DELAYED_RELEASE_CAPSULE | Freq: Every day | ORAL | 1 refills | Status: DC

## 2020-04-11 NOTE — Op Note (Addendum)
[place] Patient Name: Whitney [name] Procedure Date: 04/11/2020 2:19 PM MRN: [id number] Endoscopist: Remo Lipps P. Havery Moros , MD Age: 60 Referring MD:  Date of Birth: 05-02-1960 Gender: Female Account #: [id number] Procedure:                Upper GI endoscopy Indications:              Iron deficiency anemia, Dysphagia, Follow-up of                            gastro-esophageal reflux disease - patient off PPI,                            using apple cider vinegar to manage symptoms,                            history of complex hiatal hernia repair and Nissen                            in 2010 Medicines:                Monitored Anesthesia Care Procedure:                Pre-Anesthesia Assessment:                           - Prior to the procedure, a History and Physical                            was performed, and patient medications and                            allergies were reviewed. The patient's tolerance of                            previous anesthesia was also reviewed. The risks                            and benefits of the procedure and the sedation                            options and risks were discussed with the patient.                            All questions were answered, and informed consent                            was obtained. Prior Anticoagulants: The patient has                            taken no previous anticoagulant or antiplatelet                            agents. ASA Grade Assessment: II - A patient with  mild systemic disease. After reviewing the risks                            and benefits, the patient was deemed in                            satisfactory condition to undergo the procedure.                           After obtaining informed consent, the endoscope was                            passed under direct vision. Throughout the                            procedure, the patient's blood pressure, pulse,  and                            oxygen saturations were monitored continuously. The                            Endoscope was introduced through the mouth, and                            advanced to the second part of duodenum. The upper                            GI endoscopy was accomplished without difficulty.                            The patient tolerated the procedure well. Scope In: Scope Out: Findings:                 Esophagogastric landmarks were identified: the                            Z-line was found at 36 cm, the gastroesophageal                            junction was found at 36 cm and the upper extent of                            the gastric folds was found at 36 cm from the                            incisors.                           A 6 cm hiatal hernia was present with loosed                            fundoplication.                           LA Grade B  esophagitis was found with an associated                            ulcer at the GEJ. There was a mild stenosis at the                            GEJ but widely patent. Angulated turn entering                            hernia sac I think cause of dysphagia along with                            esophagitis. Dilation not performed.                           The exam of the esophagus was otherwise normal.                           Multiple benign appearing small sessile polyps were                            found in the gastric body, likely fundic gland                            polyps. Biopsies were taken with a cold forceps for                            histology.                           The exam of the stomach was otherwise normal.                           Biopsies were taken with a cold forceps in the                            gastric body, at the incisura and in the gastric                            antrum for Helicobacter pylori testing.                           The duodenal bulb and second portion of  the                            duodenum were normal. Complications:            No immediate complications. Estimated blood loss:                            Minimal. Estimated Blood Loss:     Estimated blood loss was minimal. Impression:               - Esophagogastric landmarks identified.                           -  6 cm hiatal hernia with loosened fundoplication                           - LA Grade B reflux esophagitis with ulcer at the                            GEJ. Mild stenosis there but widely patent.                            Angulated turn entering hernia sac.                           - Multiple benign appearing gastric polyps.                            Biopsied.                           - Normal stomach otherwise - biopsies taken to rule                            out H pylori                           - Normal duodenal bulb and second portion of the                            duodenum.                           Esophagitis / esophageal ulcer likely cause of iron                            deficiency in the setting of large recurrent hiatal                            hernia. Recommend resumption of PPI for now.                            Angulated turn at GEJ could be cause of dysphagia,                            mild stenosis noted but widely patent, dilation not                            performed given ulcer / inflammation. Recommendation:           - Patient has a contact number available for                            emergencies. The signs and symptoms of potential                            delayed complications were discussed with the  patient. Return to normal activities tomorrow.                            Written discharge instructions were provided to the                            patient.                           - Resume previous diet.                           - Continue present medications.                           - Resume  omeprazole 40mg  once daily (or PPI of                            preference)                           - Await pathology results. Remo Lipps P. Adalynn Corne, MD 04/11/2020 3:23:39 PM This report has been signed electronically. Addendum Number: 1   Addendum Date: 04/17/2020 3:51:29 PM      The measurements of GEJ / SCJ are incorrect. The patient has a large       hiatal hernia as outlined, disregard measurements. Remo Lipps P. Havery Moros, MD 04/17/2020 3:52:08 PM This report has been signed electronically.

## 2020-04-11 NOTE — Patient Instructions (Signed)
Handouts provided:  Hiatal Hernia  YOU HAD AN ENDOSCOPIC PROCEDURE TODAY AT THE [facility]:   Refer to the procedure report that was given to you for any specific questions about what was found during the examination.  If the procedure report does not answer your questions, please call your gastroenterologist to clarify.  If you requested that your care partner not be given the details of your procedure findings, then the procedure report has been included in a sealed envelope for you to review at your convenience later.  YOU SHOULD EXPECT: Some feelings of bloating in the abdomen. Passage of more gas than usual.  Walking can help get rid of the air that was put into your GI tract during the procedure and reduce the bloating. If you had a lower endoscopy (such as a colonoscopy or flexible sigmoidoscopy) you may notice spotting of blood in your stool or on the toilet paper. If you underwent a bowel prep for your procedure, you may not have a normal bowel movement for a few days.  Please Note:  You might notice some irritation and congestion in your nose or some drainage.  This is from the oxygen used during your procedure.  There is no need for concern and it should clear up in a day or so.  SYMPTOMS TO REPORT IMMEDIATELY:   Following lower endoscopy (colonoscopy or flexible sigmoidoscopy):  Excessive amounts of blood in the stool  Significant tenderness or worsening of abdominal pains  Swelling of the abdomen that is new, acute  Fever of 100F or higher   Following upper endoscopy (EGD)  Vomiting of blood or coffee ground material  New chest pain or pain under the shoulder blades  Painful or persistently difficult swallowing  New shortness of breath  Fever of 100F or higher  Black, tarry-looking stools  For urgent or emergent issues, a gastroenterologist can be reached at any hour by calling [phone]. Do not use MyChart messaging for urgent concerns.    DIET:  We do  recommend a small meal at first, but then you may proceed to your regular diet.  Drink plenty of fluids but you should avoid alcoholic beverages for 24 hours.  ACTIVITY:  You should plan to take it easy for the rest of today and you should NOT DRIVE or use heavy machinery until tomorrow (because of the sedation medicines used during the test).    FOLLOW UP: Our staff will call the number listed on your records 48-72 hours following your procedure to check on you and address any questions or concerns that you may have regarding the information given to you following your procedure. If we do not reach you, we will leave a message.  We will attempt to reach you two times.  During this call, we will ask if you have developed any symptoms of COVID 19. If you develop any symptoms (ie: fever, flu-like symptoms, shortness of breath, cough etc.) before then, please call [phone].  If you test positive for Covid 19 in the 2 weeks post procedure, please call and report this information to [place].    If any biopsies were taken you will be contacted by phone or by letter within the next 1-3 weeks.  Please call us at [phone] if you have not heard about the biopsies in 3 weeks.    SIGNATURES/CONFIDENTIALITY: You and/or your care partner have signed paperwork which will be entered into your electronic medical record.  These signatures attest to the fact  that that the information above on your After Visit Summary has been reviewed and is understood.  Full responsibility of the confidentiality of this discharge information lies with you and/or your care-partner.

## 2020-04-11 NOTE — Progress Notes (Signed)
PT taken to PACU. Monitors in place. VSS. Report given to RN.  Discussed the risk/ S/S of aspiration with the pt and receiving RN. The pt was instructed to seek care if any such issues arise.  K.Nixon Kolton, CRNA

## 2020-04-11 NOTE — Progress Notes (Signed)
1450: Pt vomited a small amount of bile colored fluid during the procedure. Oral/nasal suction provided immediately. Discussed with MD, and will explain the risk/ S/S of aspiration to the patient in recovery.

## 2020-04-11 NOTE — Op Note (Signed)
Whitney [name] Patient Name: Whitney [name] Procedure Date: 04/11/2020 2:18 PM MRN: [id number] Endoscopist: Remo Lipps P. Havery Whitney [name] , MD Age: 60 Referring MD:  Date of Birth: [birth year] Gender: Female Account #: [id number] Procedure:                Colonoscopy Indications:              Iron deficiency Medicines:                Monitored Anesthesia Care Procedure:                Pre-Anesthesia Assessment:                           - Prior to the procedure, a History and Physical                            was performed, and patient medications and                            allergies were reviewed. The patient's tolerance of                            previous anesthesia was also reviewed. The risks                            and benefits of the procedure and the sedation                            options and risks were discussed with the patient.                            All questions were answered, and informed consent                            was obtained. Prior Anticoagulants: The patient has                            taken no previous anticoagulant or antiplatelet                            agents. ASA Grade Assessment: II - A patient with                            mild systemic disease. After reviewing the risks                            and benefits, the patient was deemed in                            satisfactory condition to undergo the procedure.                           After obtaining informed consent, the colonoscope  was passed under direct vision. Throughout the                            procedure, the patient's blood pressure, pulse, and                            oxygen saturations were monitored continuously. The                            Olympus CF-HQ190 [id number]) Colonoscope was                            introduced through the anus and advanced to the the                            terminal ileum, with identification of the                             appendiceal orifice and IC valve. The colonoscopy                            was performed without difficulty. The patient                            tolerated the procedure well. The quality of the                            bowel preparation was good. The terminal ileum,                            ileocecal valve, appendiceal orifice, and rectum                            were photographed. Scope In: 2:39:04 PM Scope Out: 3:04:38 PM Scope Withdrawal Time: 0 hours 17 minutes 34 seconds  Total Procedure Duration: 0 hours 25 minutes 34 seconds  Findings:                 The perianal and digital rectal examinations were                            normal.                           The terminal ileum appeared normal.                           Scattered medium-mouthed diverticula were found in                            the transverse colon and left colon.                           The colon was tortuous with looping. Abdominal  pressure performed to complete cecal intubation.                            The patient did have an episode of vomiting during                            cecal intubation which was managed per anesthesia,                            oropharynx suctioned.                           Internal hemorrhoids were found during                            retroflexion. The hemorrhoids were small.                           The exam was otherwise without abnormality. Complications:            Estimated blood loss: None. Episode of vomiting x 1                            during the procedure as outlined. Estimated Blood Loss:     Estimated blood loss: none. Impression:               - The examined portion of the ileum was normal.                           - Diverticulosis in the transverse colon and in the                            left colon.                           - Tortuous colon.                           - Internal  hemorrhoids.                           - The examination was otherwise normal.                           - No polyps.                           No cause for iron deficiency on this exam Recommendation:           - Patient has a contact number available for                            emergencies. The signs and symptoms of potential                            delayed complications were discussed with the  patient. Return to normal activities tomorrow.                            Written discharge instructions were provided to the                            patient.                           - Resume previous diet.                           - Continue present medications.                           - Repeat colonoscopy in 10 years for screening                            purposes. Remo Lipps P. Whitney Sianez, MD 04/11/2020 3:12:27 PM This report has been signed electronically.

## 2020-04-15 ENCOUNTER — Telehealth: Payer: Self-pay

## 2020-04-15 NOTE — Telephone Encounter (Signed)
Covid-19 screening questions   Do you now or have you had a fever in the last 14 days? No.  Do you have any respiratory symptoms of shortness of breath or cough now or in the last 14 days? No.  Do you have any family members or close contacts with diagnosed or suspected Covid-19 in the past 14 days? No.  Have you been tested for Covid-19 and found to be positive? No.       Follow up Call-  Call back number 04/11/2020  Post procedure Call Back phone  # [phone]  Permission to leave phone message No  Some recent data might be hidden     Patient questions:  Do you have a fever, pain , or abdominal swelling? No. Pain Score  0 *  Have you tolerated food without any problems? Yes.    Have you been able to return to your normal activities? Yes.    Do you have any questions about your discharge instructions: Diet   No. Medications  Yes.   Follow up visit  No.  Do you have questions or concerns about your Care? Yes.  Pt. Concerned that she was told she has aspirated during the procedure, and is concerned she might develop pneumonia.  Pt. Has had a cough since the procedure was completed.  Pt. Reports she does not have any fever, and that the cough in fact has improved.  Pt. Wondered if she needed any medication to improve the condition of her esophagus.  Reviewed with pt. P. 5 of her Upper endoscopy report, that we are still awaiting results of her biopsies, and to continue her omeprazole 40. Mg. Daily.  Pt. Concerned that her mental clarity seems to be foggier since her procedure. Pt. Affirms that clarity has improved since the date of the procedure.  Reminded pt.with our recent snow/ice and being inside with routines altered, that I anticipate her clarity will return, when her normal activities outside of the home resume when the ice clears.  Told pt. To call if she has any further questions or concerns.  Told pt. We will be in touch with her asap if the biopsy results indicate  further treatment is needed, and should she develop any fever or worsening of cough, to contact her PCP.  Actions: * If pain score is 4 or above: Physician/ provider Notified : Carlota Raspberry. Havery Moros, MD.

## 2020-04-15 NOTE — Telephone Encounter (Signed)
Opal Sidles thanks for letting me know. If cough is improving I would continue to monitor. She did have an episode of vomiting during her colonoscopy but no overt aspiration. If her cough persists she should let me know. Otherwise I had recommended omeprazole 40mg  once daily, will relay results of path to her once available. Thanks

## 2020-04-16 ENCOUNTER — Ambulatory Visit: Payer: 59 | Admitting: Pharmacist

## 2020-04-16 DIAGNOSIS — K219 Gastro-esophageal reflux disease without esophagitis: Secondary | ICD-10-CM

## 2020-04-16 DIAGNOSIS — E785 Hyperlipidemia, unspecified: Secondary | ICD-10-CM

## 2020-04-16 DIAGNOSIS — E119 Type 2 diabetes mellitus without complications: Secondary | ICD-10-CM

## 2020-04-16 NOTE — Chronic Care Management (AMB) (Signed)
Care Management   Pharmacy Note  04/16/2020 Name: MAKYNNA [name] MRN: [id number] DOB: 13-Aug-1960  Subjective: Whitney [name] is a 60 y.o. year old female who is a primary care patient of Marval Regal, NP. The Care Management team was consulted for assistance with care management and care coordination needs.    Engaged with patient by telephone for follow up visit in response to provider referral for pharmacy case management and/or care coordination services.   The patient was given information about Care Management services today including:  1. Care Management services includes personalized support from designated clinical staff supervised by the patient's primary care provider, including individualized plan of care and coordination with other care providers. 2. 24/7 contact phone numbers for assistance for urgent and routine care needs. 3. The patient may stop case management services at any time by phone call to the office staff.  Patient agreed to services and consent obtained.  Assessment:  Review of patient status, including review of consultants reports, laboratory and other test data, was performed as part of comprehensive evaluation and provision of chronic care management services.   SDOH (Social Determinants of Health) assessments and interventions performed: none today   Objective:  Lab Results  Component Value Date   CREATININE 0.77 01/29/2020   CREATININE 0.76 09/14/2019   CREATININE 0.73 05/29/2019    Lab Results  Component Value Date   HGBA1C 7.5 (H) 01/29/2020       Component Value Date/Time   CHOL 212 (H) 04/07/2020 0830   CHOL 218 (H) 01/29/2020 1107   TRIG 210 (H) 04/07/2020 0830   HDL 41 (L) 04/07/2020 0830   HDL 48 01/29/2020 1107   CHOLHDL 5.2 (H) 04/07/2020 0830   VLDL 46.8 (H) 09/14/2019 1014   LDLCALC 136 (H) 04/07/2020 0830   LDLDIRECT 65.0 09/14/2019 1014    Clinical ASCVD: No  The 10-year ASCVD risk score Mikey Bussing DC Jr., et al., 2013) is:  8.5%   Values used to calculate the score:     Age: 72 years     Sex: Female     Is Non-Hispanic African American: No     Diabetic: Yes     Tobacco smoker: No     Systolic Blood Pressure: 384 mmHg     Is BP treated: No     HDL Cholesterol: 41 mg/dL     Total Cholesterol: 212 mg/dL    BP Readings from Last 3 Encounters:  04/11/20 127/80  02/13/20 110/72  01/29/20 120/82    Care Plan  Allergies  Allergen Reactions  . Sertraline Hcl     REACTION: Headache/ vision changes  . Zoloft [Sertraline Hcl]     Same as Sertraline    Medications Reviewed Today    Reviewed by De Hollingshead, RPH-CPP (Pharmacist) on 04/16/20 at 1044  Med List Status: <None>  Medication Order Taking? Sig Documenting Provider Last Dose Status Informant  ALOE VERA JUICE PO [id number] Yes Take by mouth. Gel; Using for GI stomach [provider] Taking Active   APPLE CIDER VINEGAR PO [id number] Yes Take by mouth. [provider] Taking Active   Barberry-Oreg Grape-Goldenseal (BERBERINE COMPLEX PO) [id number] Yes Take 500 mg by mouth in the morning, at noon, and at bedtime. [provider] Taking Active   Blood Glucose Monitoring Suppl (FREESTYLE LITE) DEVI [id number] Yes Use as directed to check blood sugars Marval Regal, NP Taking Active   Cholecalciferol (VITAMIN D3) 50 MCG (2000 UT) capsule [id number] Yes Take  2,000 Units by mouth daily. [provider] Taking Active   Coenzyme Q10 (COQ10) 30 MG CAPS [id number] Yes Take by mouth. [provider] Taking Active   Continuous Blood Gluc Receiver (FREESTYLE LIBRE 2 READER) DEVI [id number] Yes Use as directed to check blood sugars. Dx E11.9 Marval Regal, NP Taking Active   Continuous Blood Gluc Sensor (FREESTYLE LIBRE 2 SENSOR) [place] [id number] Yes Apply to skin every 14 days for continuous blood glucose monitoring. Marval Regal, NP Taking Active   cyclobenzaprine (FLEXERIL) 5 MG tablet [id number] No Take 1 tablet  (5 mg total) by mouth at bedtime as needed for muscle spasms.  Patient not taking: No sig reported   Marval Regal, NP Not Taking Active   famotidine (PEPCID) 20 MG tablet [id number] No Take 1 tablet (20 mg total) by mouth daily as needed for heartburn or indigestion.  Patient not taking: Reported on 04/16/2020   Noralyn Pick, NP Not Taking Active   fluticasone (FLONASE) 50 MCG/ACT nasal spray [id number] No PLACE 2 SPRAYS INTO BOTH NOSTRILS AS NEEDED FOR ALLERGIES.  Patient not taking: Reported on 04/16/2020   Marval Regal, NP Not Taking Expired 03/06/20 2359   Omega-3 Fatty Acids (FISH OIL) 1200 MG CPDR [id number] Yes Take 1,200 mg by mouth daily. [provider] Taking Active   omeprazole (PRILOSEC) 40 MG capsule [id number] Yes Take 1 capsule (40 mg total) by mouth daily. Yetta Flock, MD Taking Active   pravastatin (PRAVACHOL) 20 MG tablet [id number] No Take 1 tablet (20 mg total) by mouth daily.  Patient not taking: No sig reported   Marval Regal, NP Not Taking Active   Red Yeast Rice 600 MG CAPS [id number] Yes Take by mouth. [provider] Taking Active   triamcinolone cream (KENALOG) 0.1 % [id number]  APPLY 1 APPLICATION TOPICALLY 2 (TWO) TIMES DAILY.  Patient not taking: Reported on 04/11/2020   Lucille Passy, MD  Active Self  TRULICITY 1.5 SW/9.6PR Bonney Aid [id number] No INJECT 0.5 MLS (1.5 MG TOTAL) INTO THE SKIN ONCE A WEEK FOR 28 DAYS.  Patient not taking: Reported on 04/16/2020   Marval Regal, NP Not Taking Active           Patient Active Problem List   Diagnosis Date Noted  . DDD (degenerative disc disease), lumbar 10/19/2019  . Overweight (BMI 25.0-29.9) 09/15/2019  . History of Barrett's esophagus 09/15/2019  . Frequent headaches 10/14/2015  . Fatigue 05/05/2015  . Anemia, iron deficiency 12/30/2014  . Type 2 diabetes mellitus ([place]) 10/15/2014  . Microcytic anemia 10/15/2014  . HLD (hyperlipidemia) 10/09/2008  . G E R D  09/27/2006    Conditions to be addressed/monitored: HTN, HLD and DMII  Care Plan : Medication Management  Updates made by De Hollingshead, RPH-CPP since 04/16/2020 12:00 AM  Completed 04/16/2020  Problem: Diabetes, HLD Resolved 04/16/2020    Long-Range Goal: Disease Progression Prevention Completed 04/16/2020  Recent Progress: On track  Priority: High  Note:   . Current Barriers:  . Unable to achieve control of diabetes, hyperlipidemia  . Concern regarding prescription medications, preference for natural supplements   Pharmacist Clinical Goal(s):  Marland Kitchen Over the next 90 days, patient will achieve adherence to monitoring guidelines and medication adherence to achieve therapeutic efficacy   Interventions: . Inter-disciplinary care team collaboration (see longitudinal plan of care) . Comprehensive medication review performed; medication list updated in electronic medical record  Health Management: . Previously reviewed need to establish care with  new PCP for chronic disease management. Discussed [employer]. Provided phone number for patient today   Diabetes: . Uncontrolled; current treatment: Trulicity 1.5 mg weekly - had been off for a month, but then took one dose last week and reports tiredness, feeling "off"; patient is also taking berberine 500 mg TID;  o Hx metformin, but discontinued d/t concern for causing diarrhea, headache, joint paints. Reports resolution of those symptoms since metformin d/c  . Current glucose readings: using [employer] glucometer; fasting: 180s; after meals: 220 while on the phone 1 hour later, but often 130-150s  . Current exercise: plans [place] walking [place]. Yesterday walked for 30 minutes.  . Discussed goal of 150 minutes weekly.  . Reviewed goal A1c, goal fasting, and goal 2 hour post prandial glucose. Reviewed microvascular and macrovascular complications of uncontrolled diabetes. Patient is very uncomfortable with many  prescription medications, and would prefer to focus on diet, exercise, and supplements.  Eliott Nine to consider addition of pharmacotherapy if next A1c is still elevated after 3 months of non-pharmacologic focus.  . She notes that she felt Trulicity was not providing benefit- recommended she discuss trial of Ozempic with next PCP given greater efficacy. Discussed that "feeling off" after restarting Trulicity many have been related to re-starting at a higher-than-starting dose after having been off therapy for several weeks.   Hyperlipidemia: . Uncontrolled; current treatment: red yeast rice + CoQ10, purchased a new type red yeast rice that advertises greater absorption; did not resume pravastatin  . Medications previously tried: rosuvastatin, pravastatin; reported joint pains, resolution when medication was discontinued . Discussed dietary principals such as increase in fiber, vegetables, reduction in red meats. Discussed that if LDL is not controlled in 3 months with this regimen to discuss alternative pharmacotherapy with her next provider.  GERD/hx Barrett's: . Controlled; current regimen: omeprazole 40 mg daily per GI, apple cider vinegar to determine if she should continue PPI therapy . Continue current regimen with GI collaboration at this time   Patient Goals/Self-Care Activities . Over the next 90 days, patient will:  - take medications as prescribed check glucose using CGM, document, and provide at future appointments  Follow Up Plan: Patient to establish with new PCP     Medication Assistance:  None required.  Patient affirms current coverage meets needs.  Plan: Patient to establish with new PCP  Catie Darnelle Maffucci, PharmD, [place], [place] Clinical Pharmacist [employer] at [place]

## 2020-04-16 NOTE — Patient Instructions (Signed)
Visit Information  Patient Care Plan: Medication Management  Completed 04/16/2020  Problem Identified: Diabetes, HLD Resolved 04/16/2020    Long-Range Goal: Disease Progression Prevention Completed 04/16/2020  Recent Progress: On track  Priority: High  Note:   . Current Barriers:  . Unable to achieve control of diabetes, hyperlipidemia  . Concern regarding prescription medications, preference for natural supplements   Pharmacist Clinical Goal(s):  Marland Kitchen Over the next 90 days, patient will achieve adherence to monitoring guidelines and medication adherence to achieve therapeutic efficacy   Interventions: . Inter-disciplinary care team collaboration (see longitudinal plan of care) . Comprehensive medication review performed; medication list updated in electronic medical record  Health Management: . Previously reviewed need to establish care with new PCP for chronic disease management. Discussed [employer]. Provided phone number for patient today   Diabetes: . Uncontrolled; current treatment: Trulicity 1.5 mg weekly - had been off for a month, but then took one dose last week and reports tiredness, feeling "off"; patient is also taking berberine 500 mg TID;  o Hx metformin, but discontinued d/t concern for causing diarrhea, headache, joint paints. Reports resolution of those symptoms since metformin d/c  . Current glucose readings: using [employer] glucometer; fasting: 180s; after meals: 220 while on the phone 1 hour later, but often 130-150s  . Current exercise: plans to start a walking regimen [place] the mall. Yesterday walked for 30 minutes.  . Discussed goal of 150 minutes weekly.  . Reviewed goal A1c, goal fasting, and goal 2 hour post prandial glucose. Reviewed microvascular and macrovascular complications of uncontrolled diabetes. Patient is very uncomfortable with many prescription medications, and would prefer to focus on diet, exercise, and supplements.  Eliott Nine to consider  addition of pharmacotherapy if next A1c is still elevated after 3 months of non-pharmacologic focus.  . She notes that she felt Trulicity was not providing benefit- recommended she discuss trial of Ozempic with next PCP given greater efficacy. Discussed that "feeling off" after restarting Trulicity many have been related to re-starting at a higher-than-starting dose after having been off therapy for several weeks.   Hyperlipidemia: . Uncontrolled; current treatment: red yeast rice + CoQ10, purchased a new type red yeast rice that advertises greater absorption; did not resume pravastatin  . Medications previously tried: rosuvastatin, pravastatin; reported joint pains, resolution when medication was discontinued . Discussed dietary principals such as increase in fiber, vegetables, reduction in red meats. Discussed that if LDL is not controlled in 3 months with this regimen to discuss alternative pharmacotherapy with her next provider.  GERD/hx Barrett's: . Controlled; current regimen: omeprazole 40 mg daily per GI, apple cider vinegar to determine if she should continue PPI therapy . Continue current regimen with GI collaboration at this time   Patient Goals/Self-Care Activities . Over the next 90 days, patient will:  - take medications as prescribed check glucose using CGM, document, and provide at future appointments  Follow Up Plan: Patient to establish with new PCP     The patient verbalized understanding of instructions, educational materials, and care plan provided today and declined offer to receive copy of patient instructions, educational materials, and care plan.   Plan: Patient to establish with new PCP  Catie Darnelle Maffucci, PharmD, [place], [place] Clinical Pharmacist [employer] at [place]

## 2020-05-19 ENCOUNTER — Telehealth: Payer: Self-pay | Admitting: Nurse Practitioner

## 2020-05-19 NOTE — Telephone Encounter (Signed)
EMS and fire dept evaluated patient and advised needs to be seen patient refused going to ER,  and said she has appointment with cardiology tomorrow, if chest pain returns or she feels worse she will go to ER.  [place] RECORD AccessNurse Patient Name: Whitney [name] Gender: Female DOB: 06/07/1960 Age: 60 Y 2 M 15 D Return Phone Number: [phone] (Primary) Address: City/State/Zip: [place] [address] Client  Primary Care [place] [facility] [place] Client Site [place] - Day Contact Type Call Who Is Calling Patient / Member / Family / Caregiver Call Type Triage / Clinical Relationship To Patient Self Return Phone Number [phone] (Primary) Chief Complaint NUMBNESS/TINGLING- sudden on one side of the body or face Reason for Call Symptomatic / Request for [place] states that she has Covid and she has chest pain, dizziness, and numbness in her head. Translation No Nurse Assessment Nurse: Claiborne Billings, RN, Kim Date/Time (Eastern Time): 05/19/2020 12:15:58 PM Confirm and document reason for call. If symptomatic, describe symptoms. ---Caller states that she had COVID at the end of January. States she has had intermittent chest pain, dizziness, heart racing and numbness in her head since before COVID but since having COVID the sxs are happening more often. Currently has chest pressure in the left side of the chest and feels like she has brain fog. Does the patient have any new or worsening symptoms? ---Yes Will a triage be completed? ---Yes Related visit to physician within the last 2 weeks? ---No Does the PT have any chronic conditions? (i.e. diabetes, asthma, this includes High risk factors for pregnancy, etc.) ---Yes List chronic conditions. ---Diabetes Is this a behavioral health or substance abuse call? ---No Guidelines Guideline Title Affirmed  Question Affirmed Notes Nurse Date/Time Eilene Ghazi Time) Chest Pain [1] Chest pain lasts > 5 minutes AND [2] age > 68 Claiborne Billings, RN, Maudie Mercury 05/19/2020 12:20:14 PM Disp. Time Eilene Ghazi Time) Disposition Final User 05/19/2020 12:14:18 PM Send to Urgent Queue Windy Canny 05/19/2020 12:32:48 PM 911 Outcome Documentation Claiborne Billings, RN, Kim Reason: Called back after 5 min and she did not answer. PLEASE NOTE: All timestamps contained within this report are represented as [place] Standard Time. CONFIDENTIALTY NOTICE: This fax transmission is intended only for the addressee. It contains information that is legally privileged, confidential or otherwise protected from use or disclosure. If you are not the intended recipient, you are strictly prohibited from reviewing, disclosing, copying using or disseminating any of this information or taking any action in reliance on or regarding this information. If you have received this fax in error, please notify us immediately by telephone so that we can arrange for its return to [place]. Phone: [phone], Toll-Free: [phone], Fax: [phone] Page: 2 of 2 Call Id: [id number] 05/19/2020 12:23:44 PM Call EMS 911 Now Yes Claiborne Billings, RN, Max Sane Disagree/Comply Comply Caller Understands Yes PreDisposition InappropriateToAsk Care Advice Given Per Guideline CALL EMS 911 NOW: * Immediate medical attention is needed. You need to hang up and call 911 (or an ambulance). * Triager Discretion: I'll call you back in a few minutes to be sure you were able to reach them. CARE ADVICE given per Chest Pain (Adult

## 2020-05-19 NOTE — Telephone Encounter (Signed)
Transferred to Access Nurse   Pt stated that she had covid at the end of Jan. She is now having chest pain and numbness that goes up the up the side of her had. Dizzy spells at time. This has been on going for the last week

## 2020-05-20 ENCOUNTER — Other Ambulatory Visit: Payer: Self-pay

## 2020-05-20 ENCOUNTER — Emergency Department (HOSPITAL_COMMUNITY)
Admission: EM | Admit: 2020-05-20 | Discharge: 2020-05-20 | Disposition: A | Discharge status: Home / Self Care | Payer: 59 | Attending: Emergency Medicine | Admitting: Emergency Medicine

## 2020-05-20 ENCOUNTER — Telehealth: Payer: Self-pay | Admitting: Nurse Practitioner

## 2020-05-20 ENCOUNTER — Encounter (HOSPITAL_COMMUNITY): Payer: Self-pay | Admitting: Emergency Medicine

## 2020-05-20 ENCOUNTER — Ambulatory Visit: Payer: Self-pay | Admitting: Cardiology

## 2020-05-20 ENCOUNTER — Emergency Department (HOSPITAL_COMMUNITY): Payer: 59

## 2020-05-20 DIAGNOSIS — D72829 Elevated white blood cell count, unspecified: Secondary | ICD-10-CM | POA: Diagnosis not present

## 2020-05-20 DIAGNOSIS — E1165 Type 2 diabetes mellitus with hyperglycemia: Secondary | ICD-10-CM | POA: Insufficient documentation

## 2020-05-20 DIAGNOSIS — R0789 Other chest pain: Secondary | ICD-10-CM | POA: Insufficient documentation

## 2020-05-20 DIAGNOSIS — Z9861 Coronary angioplasty status: Secondary | ICD-10-CM | POA: Diagnosis not present

## 2020-05-20 DIAGNOSIS — R110 Nausea: Secondary | ICD-10-CM | POA: Diagnosis not present

## 2020-05-20 DIAGNOSIS — M542 Cervicalgia: Secondary | ICD-10-CM | POA: Insufficient documentation

## 2020-05-20 DIAGNOSIS — Z87891 Personal history of nicotine dependence: Secondary | ICD-10-CM | POA: Diagnosis not present

## 2020-05-20 DIAGNOSIS — H538 Other visual disturbances: Secondary | ICD-10-CM | POA: Insufficient documentation

## 2020-05-20 LAB — CBC
HCT: 42.7 % (ref 36.0–46.0)
Hemoglobin: 13.2 g/dL (ref 12.0–15.0)
MCH: 26.3 pg (ref 26.0–34.0)
MCHC: 30.9 g/dL (ref 30.0–36.0)
MCV: 85.2 fL (ref 80.0–100.0)
Platelets: 482 10*3/uL — ABNORMAL HIGH (ref 150–400)
RBC: 5.01 MIL/uL (ref 3.87–5.11)
RDW: 14.7 % (ref 11.5–15.5)
WBC: 11.7 10*3/uL — ABNORMAL HIGH (ref 4.0–10.5)
nRBC: 0 % (ref 0.0–0.2)

## 2020-05-20 LAB — BASIC METABOLIC PANEL
Anion gap: 12 (ref 5–15)
BUN: 15 mg/dL (ref 8–23)
CO2: 24 mmol/L (ref 22–32)
Calcium: 9.7 mg/dL (ref 8.9–10.3)
Chloride: 101 mmol/L (ref 98–111)
Creatinine, Ser: 0.83 mg/dL (ref 0.44–1.00)
GFR, Estimated: >60 mL/min (ref >60)
Glucose, Bld: 115 mg/dL — ABNORMAL HIGH (ref 70–99)
Potassium: 3.9 mmol/L (ref 3.5–5.1)
Sodium: 137 mmol/L (ref 135–145)

## 2020-05-20 LAB — TROPONIN I (HIGH SENSITIVITY)
Troponin I (High Sensitivity): 4 ng/L (ref <18)
Troponin I (High Sensitivity): 3 ng/L (ref <18)

## 2020-05-20 LAB — D-DIMER, QUANTITATIVE: D-Dimer, Quant: <0.27 ug/mL-FEU (ref 0.00–0.50)

## 2020-05-20 NOTE — Telephone Encounter (Signed)
Patient called in she wanted to talk the nurse she talked to on Monday and she cancelled her appointment cardiology because it is [place]

## 2020-05-20 NOTE — ED Triage Notes (Signed)
Pt reports pressure to center of chest x 1 week that radiates up L side of neck with nausea and dizziness.

## 2020-05-20 NOTE — Discharge Instructions (Addendum)

## 2020-05-20 NOTE — Telephone Encounter (Signed)
In reviewing, it appears she had an appt today that is canceled.  Agree with  need for evaluation now if having chest pain.

## 2020-05-20 NOTE — Telephone Encounter (Cosign Needed)
Patient ask about cardiologist advised patient with her having chest pain she needs to be seen now in an ER that she should not wait for a referral, that she could be sen at [facility] or [facility] ER if she does not want [facility] . Patient agreed to go to [facility] long for continued chest pain with exertion.

## 2020-05-20 NOTE — ED Notes (Signed)
Oxygen while ambulating 98%

## 2020-05-20 NOTE — ED Provider Notes (Signed)
[facility] EMERGENCY DEPARTMENT Provider Note   CSN: [id number] Arrival date & time: 05/20/20  1527     History Chief Complaint  Patient presents with  . Chest Pain    Whitney [name] is a 60 y.o. female who presents to the emergency department chief complaint of chest pain.  The patient reports that she has had chest pain and pressure with exertional dyspnea going on for many months however she developed COVID-19 infection at the end of January and since that time has had worsening symptoms.  She reports that she has constant pressure predominantly on the left side of her chest which is worse when she stands up.  She then develops associated chest pain which she describes as dull and aching.  The symptoms intensify when she exerts herself especially when she goes upstairs.  She feels her heart racing, she feels like she is going to pass out.  She feels the sensation radiate into her bilateral neck and she gets blurry vision.  She has had some nausea associated with the symptoms but denies diaphoresis, loss of consciousness.  She has family history of CAD in her father and mother.  She is a currently treated diabetic and her last A1c was 7.6.  She denies a history of smoking or hypertension but is treated for hyperlipidemia. Patient also reports "brain fog" it has been going on for a long time but again has been worsened since Covid, malaise and fatigue and chronic headaches.  HPI     Past Medical History:  Diagnosis Date  . Allergy    Seasonal  . Anxiety   . Attention deficit disorder without mention of hyperactivity   . Barrett's esophagus   . CHOLELITHIASIS 05/26/2007   Qualifier: Diagnosis of  By: Maxie Better FNP, Rosalita Levan   . Congenital anomalies of foot, not elsewhere classified   . CONSTIPATION 10/09/2008   Qualifier: Diagnosis of  By: Maxie Better FNP, Rosalita Levan   . Depression   . Diabetes mellitus without complication ([place])   . Displacement of  intervertebral disc, site unspecified, without myelopathy   . DIVERTICULAR DISEASE 02/06/2002   Qualifier: Diagnosis of  By: Maxie Better FNP, Rosalita Levan   . Diverticulosis   . Elevated blood pressure reading without diagnosis of hypertension   . Esophageal reflux   . Gallstones   . Gastroenteritis 07/27/2016  . Hiatal hernia   . HIATAL HERNIA 10/16/2008   Qualifier: Diagnosis of  By: Maxie Better FNP, Rosalita Levan   . HLD (hyperlipidemia)   . Hypoglycemia, unspecified   . Internal hemorrhoids without mention of complication   . Other abnormal blood chemistry     Patient Active Problem List   Diagnosis Date Noted  . DDD (degenerative disc disease), lumbar 10/19/2019  . Overweight (BMI 25.0-29.9) 09/15/2019  . History of Barrett's esophagus 09/15/2019  . Frequent headaches 10/14/2015  . Fatigue 05/05/2015  . Anemia, iron deficiency 12/30/2014  . Type 2 diabetes mellitus ([place]) 10/15/2014  . Microcytic anemia 10/15/2014  . HLD (hyperlipidemia) 10/09/2008  . G E R D 09/27/2006    Past Surgical History:  Procedure Laterality Date  . BREAST CYST EXCISION Left   . CARDIAC CATHETERIZATION     [facility]  . CESAREAN SECTION    . CHOLECYSTECTOMY  06/16/2007   Dr. Hulen Skains  . COLONOSCOPY  02/06/2002   diverticula and internal hemorroids  . COLONOSCOPY  2015  . HIATAL HERNIA REPAIR    . LUMBAR LAMINECTOMY     L5-S1  OB History   No obstetric history on file.     Family History  Problem Relation Age of Onset  . Colon polyps Mother   . Hypertension Father   . Diabetes Father   . Heart disease Father   . Colon polyps Maternal Grandmother   . Heart attack Maternal Grandmother   . Heart attack Paternal Grandfather   . Diabetes Maternal Aunt   . Prostate cancer Maternal Grandfather   . Colon cancer Paternal Grandmother   . Stomach cancer Neg Hx   . Esophageal cancer Neg Hx   . Pancreatic cancer Neg Hx   . Rectal cancer Neg Hx     Social History   Tobacco Use  . Smoking  status: Former Smoker    Types: Cigarettes  . Smokeless tobacco: Never Used  . Tobacco comment: 2 years as a teenager  Vaping Use  . Vaping Use: Never used  Substance Use Topics  . Alcohol use: Not Currently    Alcohol/week: 0.0 standard drinks  . Drug use: No    Home Medications Prior to Admission medications   Medication Sig Start Date End Date Taking? Authorizing Provider  ALOE VERA JUICE PO Take by mouth. Gel; Using for GI stomach    [provider]  APPLE CIDER VINEGAR PO Take by mouth.    [provider]  Barberry-Oreg Grape-Goldenseal (BERBERINE COMPLEX PO) Take 500 mg by mouth in the morning, at noon, and at bedtime.    [provider]  Blood Glucose Monitoring Suppl (FREESTYLE LITE) DEVI Use as directed to check blood sugars 02/18/20   Marval Regal, NP  Cholecalciferol (VITAMIN D3) 50 MCG (2000 UT) capsule Take 2,000 Units by mouth daily.    [provider]  Coenzyme Q10 (COQ10) 30 MG CAPS Take by mouth.    [provider]  Continuous Blood Gluc Receiver (FREESTYLE LIBRE 2 READER) DEVI Use as directed to check blood sugars. Dx E11.9 02/20/20   Marval Regal, NP  Continuous Blood Gluc Sensor (FREESTYLE LIBRE 2 SENSOR) MISC Apply to skin every 14 days for continuous blood glucose monitoring. 01/29/20   Marval Regal, NP  cyclobenzaprine (FLEXERIL) 5 MG tablet Take 1 tablet (5 mg total) by mouth at bedtime as needed for muscle spasms. Patient not taking: No sig reported 10/19/19   Marval Regal, NP  famotidine (PEPCID) 20 MG tablet Take 1 tablet (20 mg total) by mouth daily as needed for heartburn or indigestion. Patient not taking: Reported on 04/16/2020 02/13/20   Noralyn Pick, NP  fluticasone (FLONASE) 50 MCG/ACT nasal spray PLACE 2 SPRAYS INTO BOTH NOSTRILS AS NEEDED FOR ALLERGIES. Patient not taking: Reported on 04/16/2020 12/07/19 03/06/20  Marval Regal, NP  Omega-3 Fatty Acids (FISH OIL) 1200 MG CPDR  Take 1,200 mg by mouth daily.    [provider]  omeprazole (PRILOSEC) 40 MG capsule Take 1 capsule (40 mg total) by mouth daily. 04/11/20   Armbruster, Carlota Raspberry, MD  pravastatin (PRAVACHOL) 20 MG tablet Take 1 tablet (20 mg total) by mouth daily. Patient not taking: No sig reported 02/12/20   Marval Regal, NP  Red Yeast Rice 600 MG CAPS Take by mouth.    [provider]  triamcinolone cream (KENALOG) 0.1 % APPLY 1 APPLICATION TOPICALLY 2 (TWO) TIMES DAILY. Patient not taking: Reported on 04/11/2020 04/26/17   Lucille Passy, MD  TRULICITY 1.5 YB/0.1BP SOPN INJECT 0.5 MLS (1.5 MG TOTAL) INTO THE SKIN ONCE A WEEK  FOR 28 DAYS. Patient not taking: Reported on 04/16/2020 12/18/19   Marval Regal, NP    Allergies    Sertraline hcl and Zoloft [sertraline hcl]  Review of Systems   Review of Systems Ten systems reviewed and are negative for acute change, except as noted in the HPI.  Physical Exam Updated Vital Signs BP 139/85   Pulse 85   Temp 98.5 F (36.9 C) (Oral)   Resp 14   LMP 06/17/2011   SpO2 98%   Physical Exam Vitals and nursing note reviewed.  Constitutional:      General: She is not in acute distress.    Appearance: She is well-developed and well-nourished. She is not diaphoretic.  HENT:     Head: Normocephalic and atraumatic.  Eyes:     General: No scleral icterus.    Conjunctiva/sclera: Conjunctivae normal.  Cardiovascular:     Rate and Rhythm: Normal rate and regular rhythm.     Heart sounds: Normal heart sounds. No murmur heard. No friction rub. No gallop.   Pulmonary:     Effort: Pulmonary effort is normal. No respiratory distress.     Breath sounds: Normal breath sounds.  Abdominal:     General: Bowel sounds are normal. There is no distension.     Palpations: Abdomen is soft. There is no mass.     Tenderness: There is no abdominal tenderness. There is no guarding.  Musculoskeletal:     Cervical back: Normal range of motion.     Right  lower leg: No tenderness. No edema.     Left lower leg: No tenderness. No edema.  Skin:    General: Skin is warm and dry.  Neurological:     Mental Status: She is alert and oriented to person, place, and time.  Psychiatric:        Behavior: Behavior normal.     ED Results / Procedures / Treatments   Labs (all labs ordered are listed, but only abnormal results are displayed) Labs Reviewed  BASIC METABOLIC PANEL  CBC  TROPONIN I (HIGH SENSITIVITY)    EKG EKG Interpretation  Date/Time:  Tuesday May 20 2020 16:31:03 EST Ventricular Rate:  82 PR Interval:  126 QRS Duration: 91 QT Interval:  369 QTC Calculation: 431 R Axis:   12 Text Interpretation: Sinus rhythm No significant change since last tracing Confirmed by Calvert Cantor [phone]) on 05/20/2020 5:03:28 PM   Radiology DG Chest 2 View  Result Date: 05/20/2020 CLINICAL DATA:  Chest pain EXAM: CHEST - 2 VIEW COMPARISON:  08/30/2019 FINDINGS: The heart size and mediastinal contours are within normal limits. Both lungs are clear. Mild degenerative changes of the spine. Small hiatal hernia IMPRESSION: No active cardiopulmonary disease. Electronically Signed   By: Donavan Foil M.D.   On: 05/20/2020 16:33    Procedures Procedures   Medications Ordered in ED Medications - No data to display  ED Course  I have reviewed the triage vital signs and the nursing notes.  Pertinent labs & imaging results that were available during my care of the patient were reviewed by me and considered in my medical decision making (see chart for details).  Clinical Course as of 05/20/20 2206  Tue May 20, 2020  1901 D-Dimer, Quant: <0.27 [AH]    Clinical Course User Index [AH] Margarita Mail, PA-C   MDM Rules/Calculators/A&P  Patient here w/ c/o CP. I ordered and reviewed labs which include a CBC which is slightly elevated white blood cell count and platelet count likely acute phase reaction as patient was  clearly anxious. Her BMP shows mildly elevated glucose. Troponin is negative x2. Negative D-dimer. I ordered and reviewed a 2 view chest x-ray which shows no abnormalities. EKG shows normal sinus rhythm at a rate of 82. She is able to ambulate in the emergency department without hypoxia, hypotension, or significantly elevated heart rate. Given the large differential diagnosis for DIOR DOMINIK, the decision making in this case is of high complexity.  After evaluating all of the data points in this case, the presentation of LASHEKA KEMPNER is NOT consistent with Acute Coronary Syndrome (ACS) and/or myocardial ischemia, pulmonary embolism, aortic dissection; Borhaave's, significant arrythmia, pneumothorax, cardiac tamponade, or other emergent cardiopulmonary condition.  Further, the presentation of JAMILLE FISHER is NOT consistent with pericarditis, myocarditis, cholecystitis, pancreatitis, mediastinitis, endocarditis, new valvular disease.  Additionally, the presentation of Callahan L Payneis NOT consistent with flail chest, cardiac contusion, ARDS, or significant intra-thoracic or intra-abdominal bleeding.  Moreover, this presentation is NOT consistent with pneumonia, sepsis, or pyelonephritis.   Strict return and follow-up precautions have been given by me personally or by detailed written instruction given verbally by nursing staff using the teach back method to the patient/family/caregiver(s).  Data Reviewed/Counseling: I have reviewed the patient's vital signs, nursing notes, and other relevant tests/information. I had a detailed discussion regarding the historical points, exam findings, and any diagnostic results supporting the discharge diagnosis. I also discussed the need for outpatient follow-up and the need to return to the ED if symptoms worsen or if there are any questions or concerns that arise at home.  Final Clinical Impression(s) / ED Diagnoses Final diagnoses:  Atypical chest pain     Rx / DC Orders ED Discharge Orders    None       Margarita Mail, PA-C 05/20/20 2215    Truddie Hidden, MD 05/21/20 [phone]

## 2020-05-21 NOTE — Telephone Encounter (Signed)
Confirmed patient went to ER.

## 2020-05-21 NOTE — Telephone Encounter (Signed)
She was seen in ED yesterday for CP

## 2020-08-11 ENCOUNTER — Ambulatory Visit: Payer: Self-pay | Admitting: Family Medicine

## 2020-09-04 IMAGING — MR MR LUMBAR SPINE WO/W CM
7 series · 48 of 48 positions shown · IV contrast (multihance)
Comparison: MRI dated 01/07/2009 and lumbar radiographs dated
12/06/2018

CLINICAL DATA: Low back pain and bilateral leg pain with bilateral
foot numbness.

EXAM:
MRI LUMBAR SPINE WITHOUT AND WITH CONTRAST
TECHNIQUE: Multiplanar and multiecho pulse sequences of the lumbar spine were
obtained without and with intravenous contrast.
CONTRAST:  18mL MULTIHANCE GADOBENATE DIMEGLUMINE 529 MG/ML IV SOLN

[Series 3: tirm sag · sagittal · 4.0mm · 0.55mm/px · 3 of 14 slices shown]
[im 1/14]
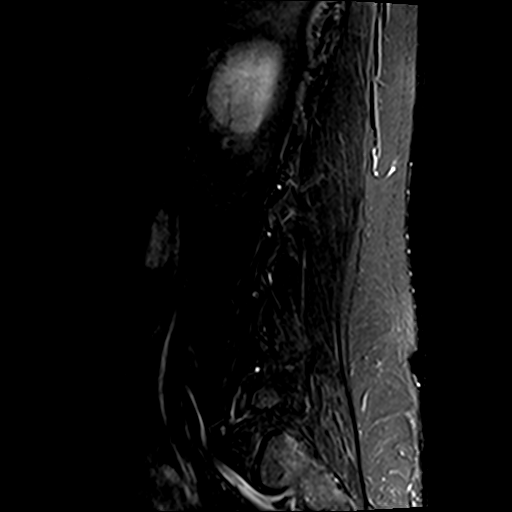
[im 7/14]
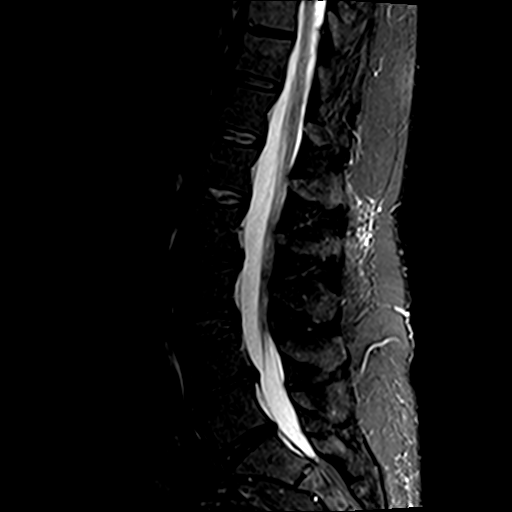
[im 14/14]
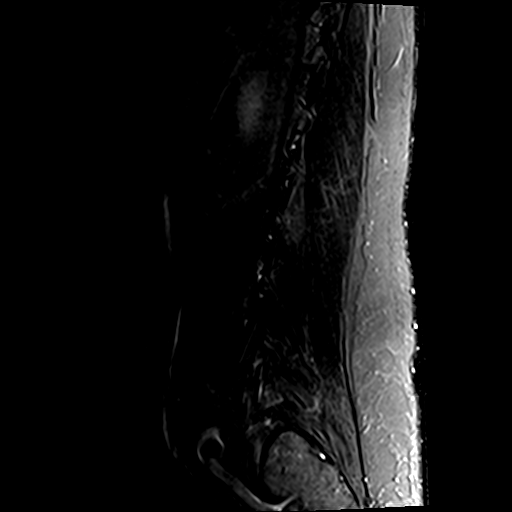

[Series 4: T1 · sagittal · 4.0mm · 0.88mm/px · 4 of 14 slices shown (1 of 2)]
[im 1/14]
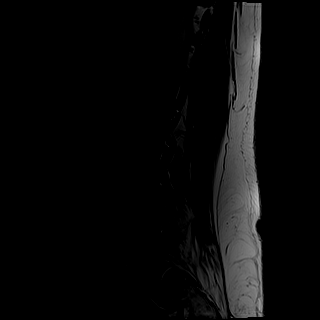
[im 5/14]
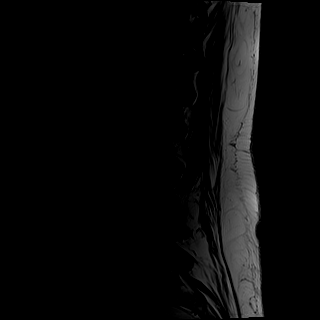
[im 9/14]
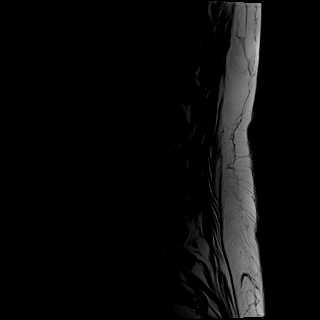
[im 14/14]
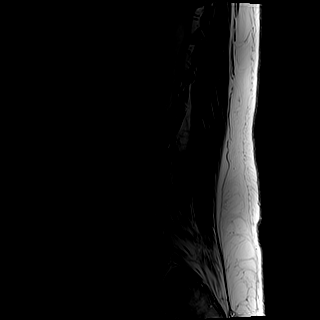

[Series 5: T2 post-contrast · sagittal · 4.0mm · 0.88mm/px · 4 of 14 slices shown]
[im 1/14]
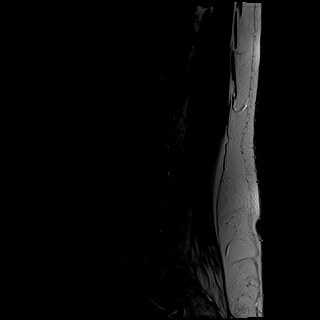
[im 5/14]
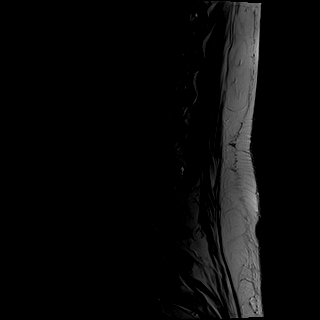
[im 9/14]
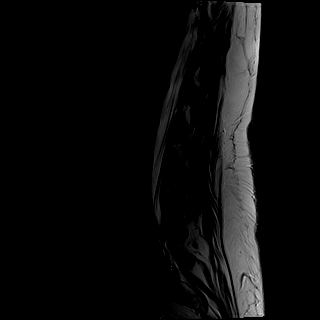
[im 14/14]
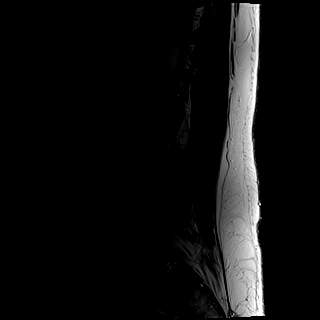

[Series 6: T1 · axial · 4.0mm · 0.78mm/px · z∈[-106,+132]mm · 11 of 39 slices shown (2 of 2)]
[im 1/39]
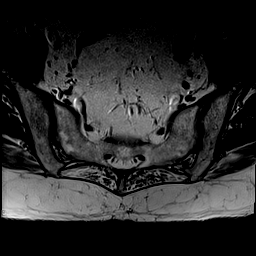
[im 4/39]
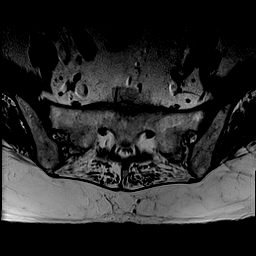
[im 8/39]
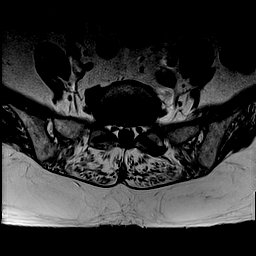
[im 12/39]
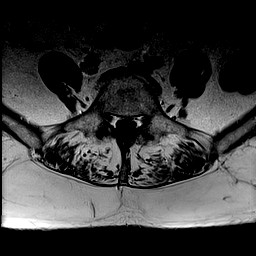
[im 16/39]
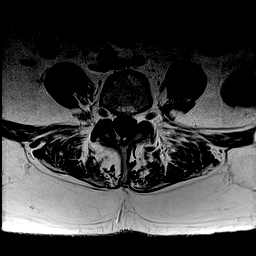
[im 20/39]
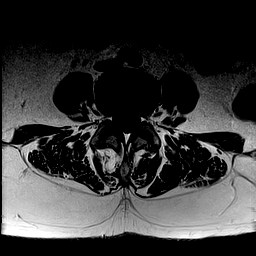
[im 23/39]
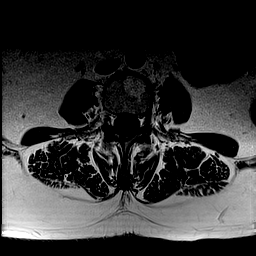
[im 27/39]
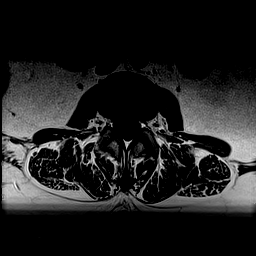
[im 31/39]
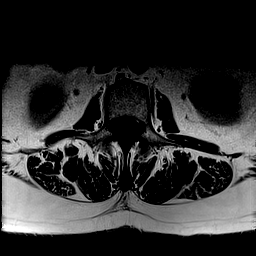
[im 35/39]
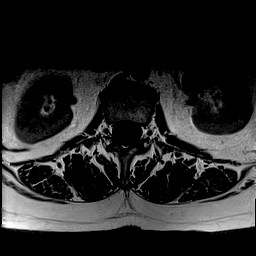
[im 39/39]
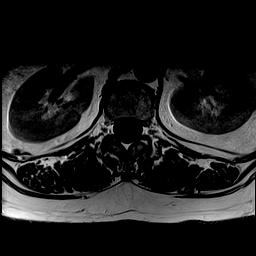

[Series 7: T2 · axial · 4.0mm · 0.78mm/px · z∈[-106,+132]mm · 11 of 39 slices shown]
[im 1/39]
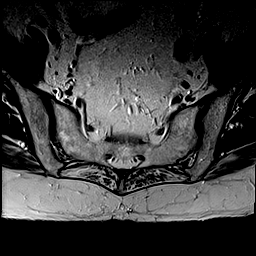
[im 4/39]
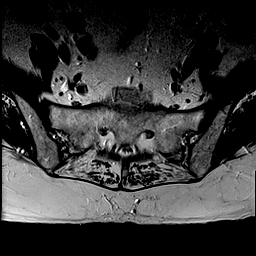
[im 8/39]
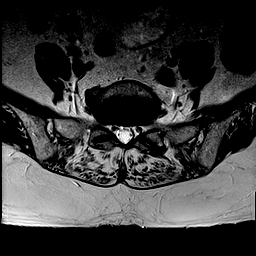
[im 12/39]
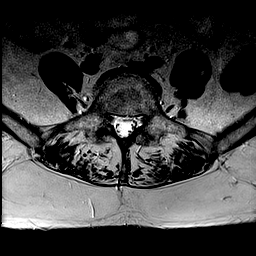
[im 16/39]
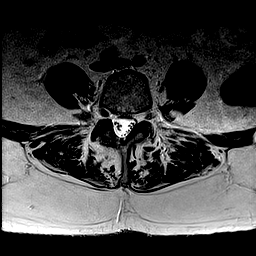
[im 20/39]
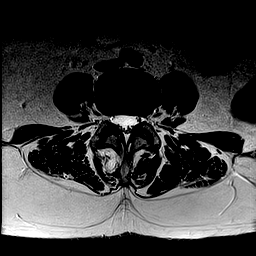
[im 23/39]
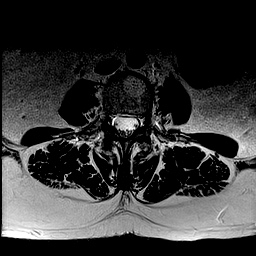
[im 27/39]
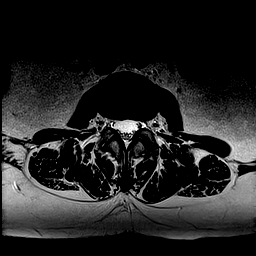
[im 31/39]
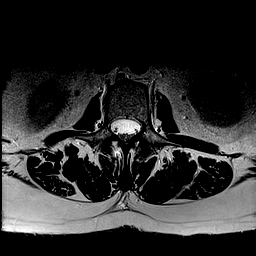
[im 35/39]
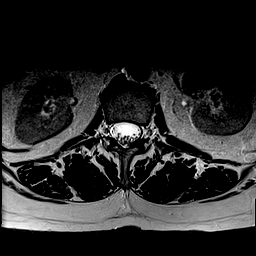
[im 39/39]
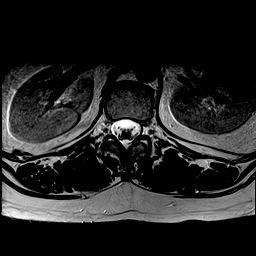

[Series 8: T1 fat-sat post-contrast · sagittal · 4.0mm · 0.88mm/px · 4 of 14 slices shown]
[im 1/14]
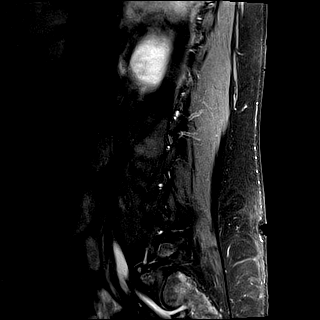
[im 5/14]
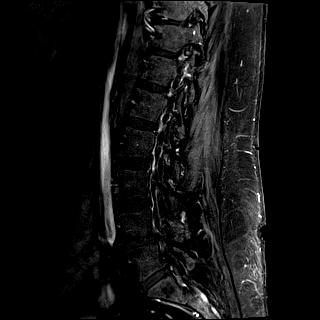
[im 9/14]
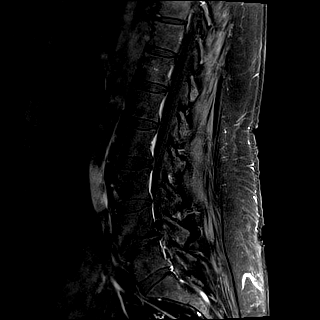
[im 14/14]
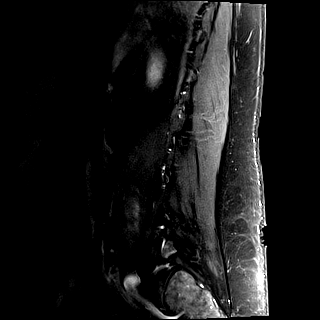

[Series 9: T1 post-contrast · axial · 4.0mm · 0.78mm/px · z∈[-106,+132]mm · 11 of 39 slices shown]
[im 1/39]
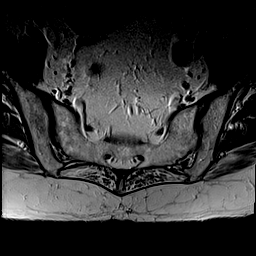
[im 4/39]
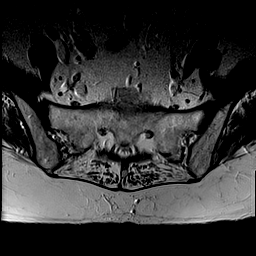
[im 8/39]
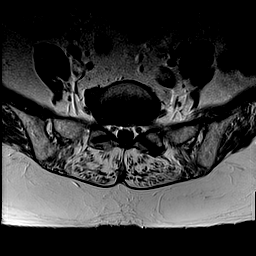
[im 12/39]
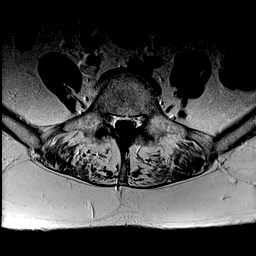
[im 16/39]
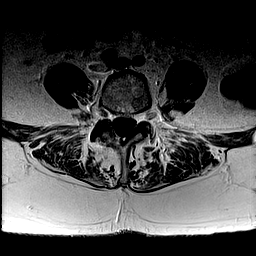
[im 20/39]
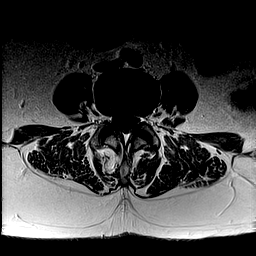
[im 23/39]
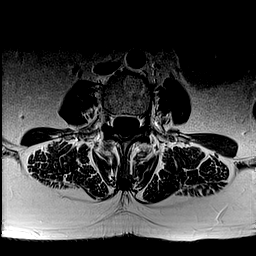
[im 27/39]
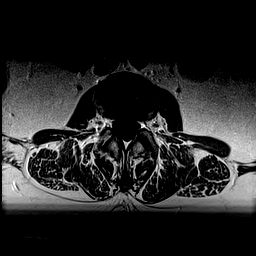
[im 31/39]
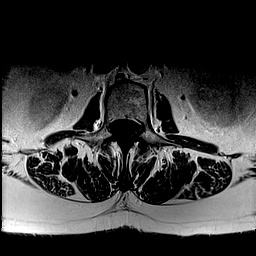
[im 35/39]
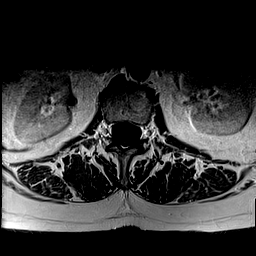
[im 39/39]
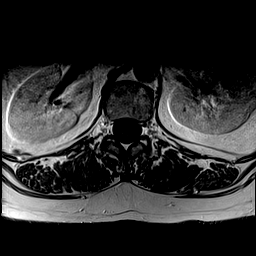

[48 of 48 positions shown; findings below may reference images not displayed]

FINDINGS: Segmentation:  Standard.

Alignment:  Physiologic.

Vertebrae: No fracture, evidence of discitis, or significant bone
lesion. Stable benign hemangioma in the anterior aspect of L3.

Conus medullaris and cauda equina: Conus extends to the L1-2 level.
Conus and cauda equina appear normal.

Paraspinal and other soft tissues: Negative.

Disc levels:

T12-L1 and L1-2: Normal.

L2-3: Minimal disc desiccation. No disc bulging or protrusion.

L3-4: Normal disc. Minimal degenerative changes of the facet joints.

L4-5: Chronic disc space narrowing with degenerative changes of the
vertebral endplates. There are broad-based endplate osteophytes
accompanying a small broad-based disc protrusion which extends into
both neural foramina without neural impingement. This is much less
prominent than the protrusion seen on the prior study of 1262. No
focal neural impingement no pathologic enhancement after contrast
administration.

L5-S1: There is a tiny disc bulge central and to the right extending
into the right neural foramen and lateral to it without impingement
upon the exiting L5 nerve.
IMPRESSION: 1. Chronic degenerative disc disease at L4-5 with a small
broad-based disc protrusion without neural impingement.
2. Tiny disc bulge central and to the right at L5-S1 without neural
impingement.
3. No other significant abnormalities.

## 2020-09-12 ENCOUNTER — Other Ambulatory Visit: Payer: Self-pay | Admitting: Gastroenterology

## 2021-02-06 ENCOUNTER — Telehealth: Payer: Self-pay

## 2021-02-06 NOTE — Telephone Encounter (Signed)
Patient requested that Dr. Danise Mina take her as a new patient since he sees her husband. Patient stated that she was seeing Dr. Deborra Medina until she left the practice. Patient stated that she then saw several NP's. Patient that she ended up at [facility] with Dawson Bills NP and now she has left there. Patient stated that she really wants to get established with a doctor. After speaking with Dr. Danise Mina patient was scheduled a TOC appointment 04/17/21 at 11:30 am.  Patient stated that she greatly appreciated Dr. Danise Mina agreeing to see her as a new patient.

## 2021-02-27 ENCOUNTER — Encounter: Payer: Self-pay | Admitting: Family Medicine

## 2021-03-09 ENCOUNTER — Other Ambulatory Visit: Payer: Self-pay | Admitting: Cardiology

## 2021-03-09 ENCOUNTER — Other Ambulatory Visit (HOSPITAL_COMMUNITY): Payer: Self-pay | Admitting: Cardiology

## 2021-03-09 DIAGNOSIS — R079 Chest pain, unspecified: Secondary | ICD-10-CM

## 2021-03-10 ENCOUNTER — Other Ambulatory Visit: Payer: Self-pay | Admitting: Gastroenterology

## 2021-03-16 ENCOUNTER — Encounter (HOSPITAL_COMMUNITY)
Admission: RE | Admit: 2021-03-16 | Discharge: 2021-03-16 | Disposition: A | Discharge status: Home / Self Care | Payer: 59 | Source: Ambulatory Visit | Attending: Cardiology | Admitting: Cardiology

## 2021-03-16 ENCOUNTER — Other Ambulatory Visit: Payer: Self-pay

## 2021-03-16 DIAGNOSIS — R079 Chest pain, unspecified: Secondary | ICD-10-CM | POA: Insufficient documentation

## 2021-03-16 MED ORDER — REGADENOSON 0.4 MG/5ML IV SOLN
INTRAVENOUS | Status: AC
  Filled 2021-03-16: qty 5

## 2021-03-16 MED ORDER — REGADENOSON 0.4 MG/5ML IV SOLN
0.4000 mg | Freq: Once | INTRAVENOUS | Status: AC
  Administered 2021-03-16: 11:00:00 0.4 mg via INTRAVENOUS

## 2021-03-16 MED ORDER — TECHNETIUM TC 99M TETROFOSMIN IV KIT
30.8000 | PACK | Freq: Once | INTRAVENOUS | Status: AC | PRN
  Administered 2021-03-16: 12:00:00 30.8 via INTRAVENOUS

## 2021-03-16 MED ORDER — TECHNETIUM TC 99M TETROFOSMIN IV KIT
10.6000 | PACK | Freq: Once | INTRAVENOUS | Status: AC | PRN
  Administered 2021-03-16: 10:00:00 10.6 via INTRAVENOUS

## 2021-04-11 ENCOUNTER — Encounter: Payer: Self-pay | Admitting: Family Medicine

## 2021-04-17 ENCOUNTER — Ambulatory Visit: Payer: 59 | Admitting: Family Medicine

## 2021-05-22 ENCOUNTER — Other Ambulatory Visit: Payer: Self-pay

## 2021-05-22 ENCOUNTER — Encounter: Payer: Self-pay | Admitting: Family Medicine

## 2021-05-22 ENCOUNTER — Ambulatory Visit: Payer: 59 | Admitting: Family Medicine

## 2021-05-22 VITALS — BP 118/68 | HR 89 | Temp 97.3°F | Ht 67.0 in | Wt 178.0 lb

## 2021-05-22 DIAGNOSIS — Z8719 Personal history of other diseases of the digestive system: Secondary | ICD-10-CM

## 2021-05-22 DIAGNOSIS — K2100 Gastro-esophageal reflux disease with esophagitis, without bleeding: Secondary | ICD-10-CM

## 2021-05-22 DIAGNOSIS — B3323 Viral pericarditis: Secondary | ICD-10-CM

## 2021-05-22 DIAGNOSIS — U099 Post covid-19 condition, unspecified: Secondary | ICD-10-CM

## 2021-05-22 DIAGNOSIS — E785 Hyperlipidemia, unspecified: Secondary | ICD-10-CM

## 2021-05-22 DIAGNOSIS — E1169 Type 2 diabetes mellitus with other specified complication: Secondary | ICD-10-CM

## 2021-05-22 LAB — POCT GLYCOSYLATED HEMOGLOBIN (HGB A1C): Hemoglobin A1C: 7.9 % — AB (ref 4.0–5.6)

## 2021-05-22 NOTE — Assessment & Plan Note (Signed)
GERD with HH and h/o barrett's, latest EGD with esophagitis and ulceration now on omeprazole 40mg  daily. Overdue for GI f/u - advised call and schedule appt, # provided.

## 2021-05-22 NOTE — Assessment & Plan Note (Signed)
Sees cardiology Terrence Dupont)

## 2021-05-22 NOTE — Progress Notes (Signed)
Patient ID: Whitney [name], female    DOB: 1961-01-20, 61 y.o.   MRN: [id number]  This visit was conducted in person.  BP 118/68    Pulse 89    Temp (!) 97.3 F (36.3 C) (Temporal)    Ht 5\' 7"  (1.702 m)    Wt 178 lb (80.7 kg)    LMP 06/17/2011    SpO2 97%    BMI 27.88 kg/m    CC: transition of care  Subjective:   HPI: Whitney [name] is a 61 y.o. female presenting on 05/22/2021 for Transitions Of Care (TOC from [facility].)   Has seen several other providers in our offices including Dr Deborra Medina, Allie Bossier, latest NP Dawson Bills. Most have left the practice.  I see her husband Shanon Brow.   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 Strong fmhx heart disease (father with CAD and mother with CHF). Also had post-COVID brain fog, fatigue - long-hauler symptoms.   GERD with h/o barrett's s/p paraesophageal hernia repair/Nissen 2010. Latest EGD 03/2020 - recurrent large HH with esophagitis with ulceration ([place]) - rec omeprazole 40mg  daily.  Colonoscopy 03/2020 diverticulosis, tortuous colon, int hem, rpt 10 yrs ([place]).  Well woman through [facility] Dr Lynnette Caffey - due for this   DM - does regularly check postprandial sugar daily - 2 hours after breakfast - 140s. Compliant with antihyperglycemic regimen which includes: trulicity 1.5mg  weekly as well as metformin 500mg  daily and jardiance 25mg  daily. Healthy diet - salmon, chicken, salads, limits carbs and sugars. Denies low sugars or hypoglycemic symptoms. Denies paresthesias, blurry vision. Last diabetic eye exam 05/2017 - DUE. Glucometer brand: BioTel. Last foot exam: 05/2019 - DUE. DSME: 2016. Lab Results  Component Value Date   HGBA1C 7.9 (A) 05/22/2021   Diabetic Foot Exam - Simple   Simple Foot Form Diabetic Foot exam was performed with the following findings: Yes 05/22/2021 11:35 AM  Visual  Inspection No deformities, no ulcerations, no other skin breakdown bilaterally: Yes Sensation Testing Intact to touch and monofilament testing bilaterally: Yes Pulse Check Posterior Tibialis and Dorsalis pulse intact bilaterally: Yes Comments    Lab Results  Component Value Date   MICROALBUR <0.7 05/29/2019        Relevant past medical, surgical, family and social history reviewed and updated as indicated. Interim medical history since our last visit reviewed. Allergies and medications reviewed and updated. Outpatient Medications Prior to Visit  Medication Sig Dispense Refill   ALOE VERA JUICE PO Take by mouth. Gel; Using for GI stomach     APPLE CIDER VINEGAR PO Take by mouth.     Ashwagandha 500 MG CAPS Take 1 capsule by mouth daily.     ASPIRIN PO Take 1 tablet by mouth daily.     atorvastatin (LIPITOR) 40 MG tablet Take 40 mg by mouth daily.     Blood Glucose Monitoring Suppl (FREESTYLE LITE) DEVI Use as directed to check blood sugars 1 each 0   Cholecalciferol (VITAMIN D3) 50 MCG (2000 UT) capsule Take 2,000 Units by mouth daily.     Continuous Blood Gluc Receiver (FREESTYLE LIBRE 2 READER) DEVI Use as directed to check blood sugars. Dx E11.9 1 each 0   Continuous Blood Gluc Sensor (FREESTYLE LIBRE 2 SENSOR) MISC Apply to skin every 14 days for continuous blood glucose monitoring. 2 each 2   Cyanocobalamin (VITAMIN B-12) 1000 MCG SUBL  Place under the tongue daily. Liquid     cyclobenzaprine (FLEXERIL) 5 MG tablet Take 1 tablet (5 mg total) by mouth at bedtime as needed for muscle spasms. 30 tablet 1   ELDERBERRY PO Take by mouth daily.     ezetimibe (ZETIA) 10 MG tablet Take 10 mg by mouth daily.     JARDIANCE 25 MG TABS tablet Take 25 mg by mouth daily.     metFORMIN (GLUCOPHAGE-XR) 500 MG 24 hr tablet Take 500 mg by mouth daily.     MISC NATURAL PRODUCTS PO Take by mouth daily. turmeric     Omega-3 Fatty Acids (FISH OIL) 1200 MG CPDR Take 1,200 mg by mouth daily.      omeprazole (PRILOSEC) 40 MG capsule Take 1 capsule (40 mg total) by mouth daily. Please schedule an office visit for further refills. Thank you 90 capsule 0   triamcinolone cream (KENALOG) 0.1 % APPLY 1 APPLICATION TOPICALLY 2 (TWO) TIMES DAILY. 30 g 1   TRULICITY 1.5 UR/4.2HC SOPN INJECT 0.5 MLS (1.5 MG TOTAL) INTO THE SKIN ONCE A WEEK FOR 28 DAYS. 6 mL 0   colchicine 0.6 MG tablet colchicine 0.6 mg tablet  TAKE ONE TABLET DAILY     pravastatin (PRAVACHOL) 20 MG tablet Take 1 tablet (20 mg total) by mouth daily. 90 tablet 3   colchicine 0.6 MG tablet Take 1 tablet (0.6 mg total) by mouth daily.     fluticasone (FLONASE) 50 MCG/ACT nasal spray PLACE 2 SPRAYS INTO BOTH NOSTRILS AS NEEDED FOR ALLERGIES. (Patient not taking: Reported on 04/16/2020) 48 mL 1   Barberry-Oreg Grape-Goldenseal (BERBERINE COMPLEX PO) Take 500 mg by mouth in the morning, at noon, and at bedtime.     Coenzyme Q10 (COQ10) 30 MG CAPS Take by mouth.     famotidine (PEPCID) 20 MG tablet Take 1 tablet (20 mg total) by mouth daily as needed for heartburn or indigestion. (Patient not taking: Reported on 04/16/2020) 30 tablet 6   Red Yeast Rice 600 MG CAPS Take by mouth.     No facility-administered medications prior to visit.     Per HPI unless specifically indicated in ROS section below Review of Systems  Objective:  BP 118/68    Pulse 89    Temp (!) 97.3 F (36.3 C) (Temporal)    Ht 5\' 7"  (1.702 m)    Wt 178 lb (80.7 kg)    LMP 06/17/2011    SpO2 97%    BMI 27.88 kg/m   Wt Readings from Last 3 Encounters:  05/22/21 178 lb (80.7 kg)  04/11/20 186 lb (84.4 kg)  02/13/20 186 lb (84.4 kg)      Physical Exam Vitals and nursing note reviewed.  Constitutional:      Appearance: Normal appearance. She is not ill-appearing.  Eyes:     Extraocular Movements: Extraocular movements intact.     Conjunctiva/sclera: Conjunctivae normal.     Pupils: Pupils are equal, round, and reactive to light.  Cardiovascular:     Rate and  Rhythm: Normal rate and regular rhythm.     Pulses: Normal pulses.     Heart sounds: Normal heart sounds. No murmur heard. Pulmonary:     Effort: Pulmonary effort is normal. No respiratory distress.     Breath sounds: Normal breath sounds. No wheezing, rhonchi or rales.  Musculoskeletal:     Right lower leg: No edema.     Left lower leg: No edema.  Skin:    General: Skin is  warm and dry.     Findings: No rash.  Neurological:     Mental Status: She is alert.  Psychiatric:        Mood and Affect: Mood normal.        Behavior: Behavior normal.      Results for orders placed or performed in visit on 05/22/21  POCT glycosylated hemoglobin (Hb A1C)  Result Value Ref Range   Hemoglobin A1C 7.9 (A) 4.0 - 5.6 %   HbA1c POC (<> result, manual entry)     HbA1c, POC (prediabetic range)     HbA1c, POC (controlled diabetic range)      Assessment & Plan:  This visit occurred during the SARS-CoV-2 public health emergency.  Safety protocols were in place, including screening questions prior to the visit, additional usage of staff PPE, and extensive cleaning of exam room while observing appropriate contact time as indicated for disinfecting solutions.   Problem List Items Addressed This Visit     Hyperlipidemia associated with type 2 diabetes mellitus ([place])    Followed by cards - now on lipitor 40mg  daily and ezetimibe 10mg  daily. Has cardiology f/u planned for next month.  The 10-year ASCVD risk score (Arnett DK, et al., 2019) is: 8.7%   Values used to calculate the score:     Age: 4 years     Sex: Female     Is Non-Hispanic African American: No     Diabetic: Yes     Tobacco smoker: No     Systolic Blood Pressure: 259 mmHg     Is BP treated: No     HDL Cholesterol: 35 mg/dL     Total Cholesterol: 208 mg/dL       Relevant Medications   ezetimibe (ZETIA) 10 MG tablet   JARDIANCE 25 MG TABS tablet   metFORMIN (GLUCOPHAGE-XR) 500 MG 24 hr tablet   ASPIRIN PO   atorvastatin (LIPITOR)  40 MG tablet   GERD (gastroesophageal reflux disease)    GERD with HH and h/o barrett's, latest EGD with esophagitis and ulceration now on omeprazole 40mg  daily. Overdue for GI f/u - advised call and schedule appt, # provided.       Type 2 diabetes mellitus with other specified complication (HCC) - Primary    Chronic, endorses good readings on postprandial cbg's (regularly checking), however A1c today elevated.  Continue current regimen at this time.  I did ask her to start monitoring fasting sugars as well and if elevated, to change metformin to night time dose.  Consider increased metformin dose.       Relevant Medications   JARDIANCE 25 MG TABS tablet   metFORMIN (GLUCOPHAGE-XR) 500 MG 24 hr tablet   ASPIRIN PO   atorvastatin (LIPITOR) 40 MG tablet   Other Relevant Orders   POCT glycosylated hemoglobin (Hb A1C) (Completed)   Microalbumin / creatinine urine ratio   History of Barrett's esophagus   Viral pericarditis    Sees cardiology ([facility])      Relevant Medications   ezetimibe (ZETIA) 10 MG tablet   ASPIRIN PO   atorvastatin (LIPITOR) 40 MG tablet   Post-COVID-19 syndrome    Describes long hauler symptoms of fatigue, brain fog, also had heart complications after COVID now followed by cardiology. Appreciate cards care.         No orders of the defined types were placed in this encounter.  Orders Placed This Encounter  Procedures   Microalbumin / creatinine urine ratio   POCT glycosylated hemoglobin (  Hb A1C)     Patient Instructions  Call Dr Havery Moros to schedule a follow up visit [phone] as you may be due.    Continue current medicines.  Good to see you today.  Return in 3-4 months for physical.  Urine test today   Follow up plan: Return in about 3 months (around 08/19/2021) for annual exam, prior fasting for blood work.  Ria Bush, MD

## 2021-05-22 NOTE — Assessment & Plan Note (Signed)
Followed by cards - now on lipitor 40mg  daily and ezetimibe 10mg  daily. Has cardiology f/u planned for next month.  The 10-year ASCVD risk score (Arnett DK, et al., 2019) is: 8.7%   Values used to calculate the score:     Age: 61 years     Sex: Female     Is Non-Hispanic African American: No     Diabetic: Yes     Tobacco smoker: No     Systolic Blood Pressure: 548 mmHg     Is BP treated: No     HDL Cholesterol: 35 mg/dL     Total Cholesterol: 208 mg/dL

## 2021-05-22 NOTE — Assessment & Plan Note (Signed)
Describes long hauler symptoms of fatigue, brain fog, also had heart complications after COVID now followed by cardiology. Appreciate cards care.

## 2021-05-22 NOTE — Assessment & Plan Note (Addendum)
Chronic, endorses good readings on postprandial cbg's (regularly checking), however A1c today elevated.  Continue current regimen at this time.  I did ask her to start monitoring fasting sugars as well and if elevated, to change metformin to night time dose.  Consider increased metformin dose.

## 2021-05-22 NOTE — Patient Instructions (Addendum)
Call Dr Havery Moros to schedule a follow up visit [phone] as you may be due.    Continue current medicines.  Good to see you today.  Return in 3-4 months for physical.  Urine test today

## 2021-05-24 LAB — MICROALBUMIN / CREATININE URINE RATIO
Creatinine, Urine: 60.9 mg/dL
Microalb/Creat Ratio: <5 mg/g creat (ref 0–29)
Microalbumin, Urine: <3 ug/mL

## 2021-06-02 ENCOUNTER — Other Ambulatory Visit: Payer: Self-pay | Admitting: Gastroenterology

## 2021-06-27 ENCOUNTER — Other Ambulatory Visit: Payer: Self-pay | Admitting: Gastroenterology

## 2021-07-09 LAB — HM DIABETES EYE EXAM

## 2021-08-04 ENCOUNTER — Other Ambulatory Visit: Payer: Self-pay | Admitting: Gastroenterology

## 2021-08-05 MED ORDER — OMEPRAZOLE 40 MG PO CPDR: 40.0000 mg | DELAYED_RELEASE_CAPSULE | Freq: Every day | ORAL | 0 refills | Status: DC

## 2021-08-15 ENCOUNTER — Other Ambulatory Visit: Payer: Self-pay | Admitting: Family Medicine

## 2021-08-15 DIAGNOSIS — D509 Iron deficiency anemia, unspecified: Secondary | ICD-10-CM

## 2021-08-15 DIAGNOSIS — E1169 Type 2 diabetes mellitus with other specified complication: Secondary | ICD-10-CM

## 2021-08-19 ENCOUNTER — Other Ambulatory Visit: Payer: 59

## 2021-08-19 ENCOUNTER — Other Ambulatory Visit (INDEPENDENT_AMBULATORY_CARE_PROVIDER_SITE_OTHER): Payer: 59

## 2021-08-19 DIAGNOSIS — E1169 Type 2 diabetes mellitus with other specified complication: Secondary | ICD-10-CM

## 2021-08-19 DIAGNOSIS — D509 Iron deficiency anemia, unspecified: Secondary | ICD-10-CM

## 2021-08-19 NOTE — Addendum Note (Signed)
Addended by: Ellamae Sia on: 08/19/2021 08:57 AM   Modules accepted: Orders

## 2021-08-20 LAB — LIPID PANEL
Cholesterol: 116 mg/dL (ref <200)
HDL: 53 mg/dL (ref >=50)
LDL Cholesterol (Calc): 42 mg/dL (calc)
Non-HDL Cholesterol (Calc): 63 mg/dL (calc) (ref <130)
Total CHOL/HDL Ratio: 2.2 (calc) (ref <5.0)
Triglycerides: 124 mg/dL (ref <150)

## 2021-08-20 LAB — COMPREHENSIVE METABOLIC PANEL
AG Ratio: 1.9 (calc) (ref 1.0–2.5)
ALT: 27 U/L (ref 6–29)
AST: 19 U/L (ref 10–35)
Albumin: 4.3 g/dL (ref 3.6–5.1)
Alkaline phosphatase (APISO): 109 U/L (ref 37–153)
BUN: 21 mg/dL (ref 7–25)
CO2: 23 mmol/L (ref 20–32)
Calcium: 9.7 mg/dL (ref 8.6–10.4)
Chloride: 104 mmol/L (ref 98–110)
Creat: 0.79 mg/dL (ref 0.50–1.05)
Globulin: 2.3 g/dL (calc) (ref 1.9–3.7)
Glucose, Bld: 144 mg/dL — ABNORMAL HIGH (ref 65–99)
Potassium: 4.9 mmol/L (ref 3.5–5.3)
Sodium: 140 mmol/L (ref 135–146)
Total Bilirubin: 0.3 mg/dL (ref 0.2–1.2)
Total Protein: 6.6 g/dL (ref 6.1–8.1)

## 2021-08-20 LAB — CBC WITH DIFFERENTIAL/PLATELET
Absolute Monocytes: 762 cells/uL (ref 200–950)
Basophils Absolute: 46 cells/uL (ref 0–200)
Basophils Relative: 0.6 %
Eosinophils Absolute: 193 cells/uL (ref 15–500)
Eosinophils Relative: 2.5 %
HCT: 45.4 % — ABNORMAL HIGH (ref 35.0–45.0)
Hemoglobin: 14.7 g/dL (ref 11.7–15.5)
Lymphs Abs: 2487 cells/uL (ref 850–3900)
MCH: 28.1 pg (ref 27.0–33.0)
MCHC: 32.4 g/dL (ref 32.0–36.0)
MCV: 86.8 fL (ref 80.0–100.0)
MPV: 9.8 fL (ref 7.5–12.5)
Monocytes Relative: 9.9 %
Neutro Abs: 4212 cells/uL (ref 1500–7800)
Neutrophils Relative %: 54.7 %
Platelets: 402 10*3/uL — ABNORMAL HIGH (ref 140–400)
RBC: 5.23 10*6/uL — ABNORMAL HIGH (ref 3.80–5.10)
RDW: 13.4 % (ref 11.0–15.0)
Total Lymphocyte: 32.3 %
WBC: 7.7 10*3/uL (ref 3.8–10.8)

## 2021-08-20 LAB — IRON, TOTAL/TOTAL IRON BINDING CAP
%SAT: 14 % (calc) — ABNORMAL LOW (ref 16–45)
Iron: 67 ug/dL (ref 45–160)
TIBC: 488 mcg/dL (calc) — ABNORMAL HIGH (ref 250–450)

## 2021-08-20 LAB — FERRITIN: Ferritin: 7 ng/mL — ABNORMAL LOW (ref 16–288)

## 2021-08-20 LAB — HEMOGLOBIN A1C
Hgb A1c MFr Bld: 8 % of total Hgb — ABNORMAL HIGH (ref <5.7)
Mean Plasma Glucose: 183 mg/dL
eAG (mmol/L): 10.1 mmol/L

## 2021-08-20 LAB — TRANSFERRIN: Transferrin: 395 mg/dL — ABNORMAL HIGH (ref 188–341)

## 2021-08-21 ENCOUNTER — Ambulatory Visit: Payer: 59 | Admitting: Nurse Practitioner

## 2021-08-25 NOTE — Progress Notes (Unsigned)
08/25/2021 Whitney [name] [id number] February 06, 1961   Chief Complaint:  Omeprazole refill   History of Present Illness:  Whitney [name] is a 61 year old female with a past medical history of anxiety, DM II 2016, diverticulosis, constipation, GERD and Barrett's esophagus. She is followed by Dr. Havery Moros.   EGD 04/11/2020: - Esophagogastric landmarks identified. - 6 cm hiatal hernia with loosened fundoplication - LA Grade B reflux esophagitis with ulcer at the GEJ. Mild stenosis there but widely patent. Angulated turn entering hernia sac. - Multiple benign appearing gastric polyps. Biopsied. - Normal stomach otherwise - biopsies taken to rule out H pylori  Esophagitis / esophageal ulcer likely cause of iron deficiency in the setting of large recurrent hiatal hernia. Recommend resumption of PPI for now. Angulated turn at GEJ could be cause of dysphagia, mild stenosis noted but widely patent, dilation not performed given ulcer / inflammation.  Colonoscopy 04/11/2020: - The examined portion of the ileum was normal. - Diverticulosis in the transverse colon and in the left colon. - Tortuous colon. - Internal hemorrhoids. - The examination was otherwise normal. - No polyps. - 10 year colonoscopy recall No cause for iron deficiency on this exam   1. Surgical [P], gastric polyp - FUNDIC GLAND POLYP(S). - NO INTESTINAL METAPLASIA, DYSPLASIA, OR MALIGNANCY. 2. Surgical [P], gastric antrum and gastric body - REACTIVE GASTROPATHY. Whitney [name] IS NEGATIVE FOR HELICOBACTER PYLORI. - NO INTESTINAL METAPLASIA, DYSPLASIA, OR MALIGNANCY.    EGD 11/21/2013 Dr. Olevia Perches: 1. small reducible hernia 2. is from squamocolumnar Junction to followup on Barrett's esophagus   Colonoscopy 11/21/2013 by Dr. Olevia Perches: 1.Sessile polyp ranging between 3-66m in size was found in the sigmoid colon; polypectomy was performed with cold forceps 2. long redundant colon. Difficult exam. 3. low volume  hematochezia is likely from a rectal source Biopsy Report: 1. Surgical [P], GE junction, biopsy - GASTROESOPHAGEAL JUNCTION MUCOSA WITH MILD INFLAMMATION CONSISTENT WITH GASTROESOPHAGEAL REFLUX. NO INTESTINAL METAPLASIA, DYSPLASIA OR MALIGNANCY IDENTIFIED. 2. Surgical [P], at 15 cm, biopsy - HYPERPLASTIC POLYP(S). NO ADENOMATOUS CHANGE OR MALIGNANCY IDENTIFIED.   Esophageal manometry 12/23/2008: Showed a normal lower esophageal sphincter, 6/8 peristaltic contractions, one simultaneous contraction and one retrograde contraction.    EGD 10/14/2008: Hiatal hernia Esophagitis in the distal esophagus Otherwise normal examination Status post biopsies  Biopsy Report: GE junction to rule out Barrett's esophagus: I suspect the dysphagia is due to dysmotility. ESOPHAGUS: INTESTINAL METAPLASIA (GOBLET CELL METAPLASIA)  CONSISTENT WITH BARRETT' S ESOPHAGUS.  NO DYSPLASIA OR MALIGNANCY  IDENTIFIED.    EGD May 2006 Patient participated in a clinical study on gastroesophageal reflux.  EGD by Dr. KDeatra Ina was reported as normal.     Colonoscopy 02/06/2002: Sigmoid diverticulosis Internal hemorrhoids    Current Medications, Allergies, Past Medical History, Past Surgical History, Family History and Social History were reviewed in [employer].   Review of Systems:   Constitutional: Negative for fever, sweats, chills or weight loss.  Respiratory: Negative for shortness of breath.   Cardiovascular: Negative for chest pain, palpitations and leg swelling.  Gastrointestinal: See HPI.  Musculoskeletal: Negative for back pain or muscle aches.  Neurological: Negative for dizziness, headaches or paresthesias.    Physical Exam: LMP 06/17/2011  General: Well developed, w   ***female in no acute distress. Head: Normocephalic and atraumatic. Eyes: No scleral icterus. Conjunctiva pink . Ears: Normal auditory acuity. Mouth: Dentition intact. No ulcers or lesions.  Lungs:  Clear throughout to auscultation. Heart: Regular rate and rhythm,  no murmur. Abdomen: Soft, nontender and nondistended. No masses or hepatomegaly. Normal bowel sounds x 4 quadrants.  Rectal: *** Musculoskeletal: Symmetrical with no gross deformities. Extremities: No edema. Neurological: Alert oriented x 4. No focal deficits.  Psychological: Alert and cooperative. Normal mood and affect  Assessment and Recommendations: ***

## 2021-08-26 ENCOUNTER — Encounter: Payer: Self-pay | Admitting: Family Medicine

## 2021-08-26 ENCOUNTER — Telehealth: Payer: Self-pay | Admitting: Pharmacy Technician

## 2021-08-26 ENCOUNTER — Ambulatory Visit (INDEPENDENT_AMBULATORY_CARE_PROVIDER_SITE_OTHER): Payer: 59 | Admitting: Family Medicine

## 2021-08-26 ENCOUNTER — Encounter: Payer: Self-pay | Admitting: Nurse Practitioner

## 2021-08-26 ENCOUNTER — Encounter: Payer: 59 | Admitting: Family Medicine

## 2021-08-26 ENCOUNTER — Ambulatory Visit: Payer: 59 | Admitting: Nurse Practitioner

## 2021-08-26 VITALS — BP 110/70 | HR 96 | Ht 65.5 in | Wt 175.4 lb

## 2021-08-26 VITALS — BP 118/82 | HR 78 | Temp 97.3°F | Ht 65.5 in | Wt 173.1 lb

## 2021-08-26 DIAGNOSIS — Z23 Encounter for immunization: Secondary | ICD-10-CM | POA: Diagnosis not present

## 2021-08-26 DIAGNOSIS — E611 Iron deficiency: Secondary | ICD-10-CM | POA: Diagnosis not present

## 2021-08-26 DIAGNOSIS — E785 Hyperlipidemia, unspecified: Secondary | ICD-10-CM

## 2021-08-26 DIAGNOSIS — K219 Gastro-esophageal reflux disease without esophagitis: Secondary | ICD-10-CM | POA: Diagnosis not present

## 2021-08-26 DIAGNOSIS — E1169 Type 2 diabetes mellitus with other specified complication: Secondary | ICD-10-CM

## 2021-08-26 DIAGNOSIS — Z0000 Encounter for general adult medical examination without abnormal findings: Secondary | ICD-10-CM | POA: Diagnosis not present

## 2021-08-26 DIAGNOSIS — R5383 Other fatigue: Secondary | ICD-10-CM

## 2021-08-26 DIAGNOSIS — K2100 Gastro-esophageal reflux disease with esophagitis, without bleeding: Secondary | ICD-10-CM

## 2021-08-26 DIAGNOSIS — Z8719 Personal history of other diseases of the digestive system: Secondary | ICD-10-CM

## 2021-08-26 MED ORDER — OMEPRAZOLE 40 MG PO CPDR: 40.0000 mg | DELAYED_RELEASE_CAPSULE | Freq: Every day | ORAL | 3 refills | Status: DC

## 2021-08-26 MED ORDER — METFORMIN HCL ER 500 MG PO TB24: 500.0000 mg | ORAL_TABLET | Freq: Two times a day (BID) | ORAL | 3 refills | Status: DC

## 2021-08-26 MED ORDER — TRULICITY 3 MG/0.5ML ~~LOC~~ SOAJ
3.0000 mg | SUBCUTANEOUS | 3 refills | Status: DC
Start: 2021-08-26 — End: 2022-10-08

## 2021-08-26 NOTE — Progress Notes (Signed)
Patient ID: Whitney [name], female    DOB: 18-May-1960, 61 y.o.   MRN: [id number]  This visit was conducted in person.  BP 118/82   Pulse 78   Temp (!) 97.3 F (36.3 C) (Temporal)   Ht 5' 5.5" (1.664 m)   Wt 173 lb 2 oz (78.5 kg)   LMP 06/17/2011   SpO2 98%   BMI 28.37 kg/m    CC: CPE Subjective:   HPI: Whitney [name] is a 61 y.o. 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  Upcoming GI appt later today for med refill (PPI).  Notes some memory difficulty - remembering names.  Ongoing cough for the past 1.5 months, managing with cough medication. Some improvement.  Notes some joint discomfort - stiff achiness. Back pain - previously managed with naprosyn - however transitioning to tylenol PRN. Notes fatigue in setting of iron deficiency without anemia. Has benefited from previous iron infusions.   Interested in seeing nutritionist - remote DMSE completed.   Preventative: COLONOSCOPY 03/2020 - diverticulosis, tortuous colon, int hem, rpt 10 yrs ([facility]). Mammogram yearly with GYN - upcoming tomorrow.  Well woman exam - [facility] in [place] (Dr Lynnette Caffey) - sees yearly DEXA scan 12/2019 - osteopenia T -1.5 spine Lung cancer screening - not eligible Flu shot -yearly COVID shot - Moderna 05/2019, 06/2019 Tdap 2012, 2015 Pneumovax-23 07/2017 Shingrix - discussed, first dose today Advanced directive discussion - not discussed Seat belt use discussed  Sunscreen use discussed. No changing moles on skin. Non smoker  Alcohol - none Dentist - Q6 mo DM eye exam yearly - will request records from Dr Jabier Mutton in [facility] in [place]  Bowel - no constipation Bladder -  no incontinence  Lives with husband, Welton Flakes: owns embroidery business  Activity: walking some Diet: good water, vegetables daily, good fish, chicken      Relevant past medical, surgical, family and social history reviewed and updated as  indicated. Interim medical history since our last visit reviewed. Allergies and medications reviewed and updated. Outpatient Medications Prior to Visit  Medication Sig Dispense Refill   ASPIRIN PO Take 1 tablet by mouth daily.     atorvastatin (LIPITOR) 40 MG tablet Take 40 mg by mouth daily.     Blood Glucose Monitoring Suppl (FREESTYLE LITE) DEVI Use as directed to check blood sugars 1 each 0   Cholecalciferol (VITAMIN D3) 50 MCG (2000 UT) capsule Take 2,000 Units by mouth daily.     colchicine 0.6 MG tablet Take 1 tablet (0.6 mg total) by mouth daily.     Continuous Blood Gluc Receiver (FREESTYLE LIBRE 2 READER) DEVI Use as directed to check blood sugars. Dx E11.9 1 each 0   Continuous Blood Gluc Sensor (FREESTYLE LIBRE 2 SENSOR) MISC Apply to skin every 14 days for continuous blood glucose monitoring. 2 each 2   Cyanocobalamin (VITAMIN B-12) 1000 MCG SUBL Place under the tongue daily. Liquid     cyclobenzaprine (FLEXERIL) 5 MG tablet Take 1 tablet (5 mg total) by mouth at bedtime as needed for muscle spasms. 30 tablet 1   ezetimibe (ZETIA) 10 MG tablet Take 10 mg by mouth daily.     JARDIANCE 25 MG TABS tablet Take 25 mg by mouth daily.     Omega-3 Fatty Acids (FISH OIL) 1200 MG CPDR Take 1,200 mg by mouth daily.     omeprazole (PRILOSEC) 40 MG capsule Take 1 capsule (40 mg  total) by mouth daily. Please keep your appointment pm May 26th at 3:00pm for further refills. Thank you 90 capsule 0   triamcinolone cream (KENALOG) 0.1 % APPLY 1 APPLICATION TOPICALLY 2 (TWO) TIMES DAILY. 30 g 1   ALOE VERA JUICE PO Take by mouth. Gel; Using for GI stomach     APPLE CIDER VINEGAR PO Take by mouth.     ELDERBERRY PO Take by mouth daily.     metFORMIN (GLUCOPHAGE-XR) 500 MG 24 hr tablet Take 500 mg by mouth daily. Takes 1 tablet twice a day     TRULICITY 1.5 NG/2.9BM SOPN INJECT 0.5 MLS (1.5 MG TOTAL) INTO THE SKIN ONCE A WEEK FOR 28 DAYS. 6 mL 0   fluticasone (FLONASE) 50 MCG/ACT nasal spray PLACE 2  SPRAYS INTO BOTH NOSTRILS AS NEEDED FOR ALLERGIES. (Patient not taking: Reported on 04/16/2020) 48 mL 1   Ashwagandha 500 MG CAPS Take 1 capsule by mouth daily.     MISC NATURAL PRODUCTS PO Take by mouth daily. turmeric     No facility-administered medications prior to visit.     Per HPI unless specifically indicated in ROS section below Review of Systems  Constitutional:  Negative for activity change, appetite change, chills, fatigue, fever and unexpected weight change.  HENT:  Negative for hearing loss.   Eyes:  Negative for visual disturbance.  Respiratory:  Positive for cough. Negative for chest tightness, shortness of breath and wheezing.   Cardiovascular:  Negative for chest pain, palpitations and leg swelling.  Gastrointestinal:  Negative for abdominal distention, abdominal pain, blood in stool, constipation, diarrhea, nausea and vomiting.  Genitourinary:  Negative for difficulty urinating and hematuria.  Musculoskeletal:  Negative for arthralgias, myalgias and neck pain.  Skin:  Negative for rash.  Neurological:  Negative for dizziness, seizures, syncope and headaches.  Hematological:  Negative for adenopathy. Does not bruise/bleed easily.  Psychiatric/Behavioral:  Negative for dysphoric mood. The patient is not nervous/anxious.    Objective:  BP 118/82   Pulse 78   Temp (!) 97.3 F (36.3 C) (Temporal)   Ht 5' 5.5" (1.664 m)   Wt 173 lb 2 oz (78.5 kg)   LMP 06/17/2011   SpO2 98%   BMI 28.37 kg/m   Wt Readings from Last 3 Encounters:  08/26/21 173 lb 2 oz (78.5 kg)  05/22/21 178 lb (80.7 kg)  04/11/20 186 lb (84.4 kg)      Physical Exam Vitals and nursing note reviewed.  Constitutional:      Appearance: Normal appearance. She is not ill-appearing.  HENT:     Head: Normocephalic and atraumatic.     Right Ear: Tympanic membrane, ear canal and external ear normal. There is no impacted cerumen.     Left Ear: Tympanic membrane, ear canal and external ear normal. There  is no impacted cerumen.  Eyes:     General:        Right eye: No discharge.        Left eye: No discharge.     Extraocular Movements: Extraocular movements intact.     Conjunctiva/sclera: Conjunctivae normal.     Pupils: Pupils are equal, round, and reactive to light.  Neck:     Thyroid: No thyroid mass or thyromegaly.  Cardiovascular:     Rate and Rhythm: Normal rate and regular rhythm.     Pulses: Normal pulses.     Heart sounds: Normal heart sounds. No murmur heard. Pulmonary:     Effort: Pulmonary effort is normal. No  respiratory distress.     Breath sounds: Normal breath sounds. No wheezing, rhonchi or rales.  Abdominal:     General: Bowel sounds are normal. There is no distension.     Palpations: Abdomen is soft. There is no mass.     Tenderness: There is no abdominal tenderness. There is no guarding or rebound.     Hernia: No hernia is present.  Musculoskeletal:     Cervical back: Normal range of motion and neck supple. No rigidity.     Right lower leg: No edema.     Left lower leg: No edema.  Lymphadenopathy:     Cervical: No cervical adenopathy.  Skin:    General: Skin is warm and dry.     Findings: No rash.  Neurological:     General: No focal deficit present.     Mental Status: She is alert. Mental status is at baseline.  Psychiatric:        Mood and Affect: Mood normal.        Behavior: Behavior normal.      Results for orders placed or performed in visit on 08/19/21  Iron, Total/Total Iron Binding Cap  Result Value Ref Range   Iron 67 45 - 160 mcg/dL   TIBC 488 (H) 250 - 450 mcg/dL (calc)   %SAT 14 (L) 16 - 45 % (calc)  Transferrin  Result Value Ref Range   Transferrin 395 (H) 188 - 341 mg/dL  Hemoglobin A1c  Result Value Ref Range   Hgb A1c MFr Bld 8.0 (H) <5.7 % of total Hgb   Mean Plasma Glucose 183 mg/dL   eAG (mmol/L) 10.1 mmol/L  Ferritin  Result Value Ref Range   Ferritin 7 (L) 16 - 288 ng/mL  CBC with Differential/Platelet  Result Value  Ref Range   WBC 7.7 3.8 - 10.8 Thousand/uL   RBC 5.23 (H) 3.80 - 5.10 Million/uL   Hemoglobin 14.7 11.7 - 15.5 g/dL   HCT 45.4 (H) 35.0 - 45.0 %   MCV 86.8 80.0 - 100.0 fL   MCH 28.1 27.0 - 33.0 pg   MCHC 32.4 32.0 - 36.0 g/dL   RDW 13.4 11.0 - 15.0 %   Platelets 402 (H) 140 - 400 Thousand/uL   MPV 9.8 7.5 - 12.5 fL   Neutro Abs 4,212 1,500 - 7,800 cells/uL   Lymphs Abs 2,487 850 - 3,900 cells/uL   Absolute Monocytes 762 200 - 950 cells/uL   Eosinophils Absolute 193 15 - 500 cells/uL   Basophils Absolute 46 0 - 200 cells/uL   Neutrophils Relative % 54.7 %   Total Lymphocyte 32.3 %   Monocytes Relative 9.9 %   Eosinophils Relative 2.5 %   Basophils Relative 0.6 %  Lipid panel  Result Value Ref Range   Cholesterol 116 <200 mg/dL   HDL 53 > OR = 50 mg/dL   Triglycerides 124 <150 mg/dL   LDL Cholesterol (Calc) 42 mg/dL (calc)   Total CHOL/HDL Ratio 2.2 <5.0 (calc)   Non-HDL Cholesterol (Calc) 63 <130 mg/dL (calc)  Comprehensive metabolic panel  Result Value Ref Range   Glucose, Bld 144 (H) 65 - 99 mg/dL   BUN 21 7 - 25 mg/dL   Creat 0.79 0.50 - 1.05 mg/dL   BUN/Creatinine Ratio NOT APPLICABLE 6 - 22 (calc)   Sodium 140 135 - 146 mmol/L   Potassium 4.9 3.5 - 5.3 mmol/L   Chloride 104 98 - 110 mmol/L   CO2 23 20 - 32 mmol/L  Calcium 9.7 8.6 - 10.4 mg/dL   Total Protein 6.6 6.1 - 8.1 g/dL   Albumin 4.3 3.6 - 5.1 g/dL   Globulin 2.3 1.9 - 3.7 g/dL (calc)   AG Ratio 1.9 1.0 - 2.5 (calc)   Total Bilirubin 0.3 0.2 - 1.2 mg/dL   Alkaline phosphatase (APISO) 109 37 - 153 U/L   AST 19 10 - 35 U/L   ALT 27 6 - 29 U/L    Assessment & Plan:   Problem List Items Addressed This Visit     Health maintenance examination - Primary (Chronic)    Preventative protocols reviewed and updated unless pt declined. Discussed healthy diet and lifestyle.        Hyperlipidemia associated with type 2 diabetes mellitus (HCC)    Chronic, stable on atorvastatin and ezetimibe through  cardiology. The ASCVD Risk score (Arnett DK, et al., 2019) failed to calculate for the following reasons:   The valid total cholesterol range is 130 to 320 mg/dL        Relevant Medications   metFORMIN (GLUCOPHAGE-XR) 500 MG 24 hr tablet   Dulaglutide (TRULICITY) 3 UT/6.5YY SOPN   Other Relevant Orders   Ambulatory referral to diabetic education   GERD (gastroesophageal reflux disease)    Regularly sees GI - upcoming appt later today. Continues PPI        Type 2 diabetes mellitus with other specified complication (HCC)    Chronic, A1c above goal. Will increase trulicity to '3mg'$  weekly.  RTC 4 mo DM f/u visit.        Relevant Medications   metFORMIN (GLUCOPHAGE-XR) 500 MG 24 hr tablet   Dulaglutide (TRULICITY) 3 TK/3.5WS SOPN   Other Relevant Orders   Ambulatory referral to diabetic education   Iron deficiency    Given endorsing fatigue will order iron infusion. H/o constipation with oral iron replacement, hesitant to do oral in setting of increasing GLP1RA dose.        Fatigue    Rx iron infusion.       History of Barrett's esophagus   Other Visit Diagnoses     Need for shingles vaccine       Relevant Orders   Varicella-zoster vaccine IM (Completed)        Meds ordered this encounter  Medications   metFORMIN (GLUCOPHAGE-XR) 500 MG 24 hr tablet    Sig: Take 1 tablet (500 mg total) by mouth in the morning and at bedtime.    Dispense:  180 tablet    Refill:  3   Dulaglutide (TRULICITY) 3 FK/8.1EX SOPN    Sig: Inject 3 mg as directed once a week.    Dispense:  6 mL    Refill:  3    Note new dose   Orders Placed This Encounter  Procedures   Varicella-zoster vaccine IM   Ambulatory referral to diabetic education    Referral Priority:   Routine    Referral Type:   Consultation    Referral Reason:   Specialty Services Required    Number of Visits Requested:   1    Patient instructions: We will request latest mammogram from [facility] ([place]).   We will request latest diabetic eye exam from Dr Jabier Mutton in [facility] in [place]  First shingrix vaccine today, return in 2-6 months to complete the series.  We will refer you to nutritionist for refresher course.  We will set you up for iron infusion in [place].  Check with Dr Terrence Dupont  about need for fish oil.  You are doing well today Return in 4 months for diabetes follow up.   Follow up plan: Return in about 4 months (around 12/26/2021) for follow up visit.  Ria Bush, MD

## 2021-08-26 NOTE — Patient Instructions (Addendum)
We will request latest mammogram from [facility] ([place]).  We will request latest diabetic eye exam from Dr Jabier Mutton in [facility] in [place]  First shingrix vaccine today, return in 2-6 months to complete the series.  We will refer you to nutritionist for refresher course.  We will set you up for iron infusion in [place].  Check with Dr Terrence Dupont about need for fish oil.  You are doing well today Return in 4 months for diabetes follow up.   Health Maintenance for Postmenopausal Women Menopause is a normal process in which your ability to get pregnant comes to an end. This process happens slowly over many months or years, usually between the ages of 72 and 28. Menopause is complete when you have missed your menstrual period for 12 months. It is important to talk with your health care provider about some of the most common conditions that affect women after menopause (postmenopausal women). These include heart disease, cancer, and bone loss (osteoporosis). Adopting a healthy lifestyle and getting preventive care can help to promote your health and wellness. The actions you take can also lower your chances of developing some of these common conditions. What are the signs and symptoms of menopause? During menopause, you may have the following symptoms: Hot flashes. These can be moderate or severe. Night sweats. Decrease in sex drive. Mood swings. Headaches. Tiredness (fatigue). Irritability. Memory problems. Problems falling asleep or staying asleep. Talk with your health care provider about treatment options for your symptoms. Do I need hormone replacement therapy? Hormone replacement therapy is effective in treating symptoms that are caused by menopause, such as hot flashes and night sweats. Hormone replacement carries certain risks, especially as you become older. If you are thinking about using estrogen or estrogen with progestin, discuss the benefits and risks with  your health care provider. How can I reduce my risk for heart disease and stroke? The risk of heart disease, heart attack, and stroke increases as you age. One of the causes may be a change in the body's hormones during menopause. This can affect how your body uses dietary fats, triglycerides, and cholesterol. Heart attack and stroke are medical emergencies. There are many things that you can do to help prevent heart disease and stroke. Watch your blood pressure High blood pressure causes heart disease and increases the risk of stroke. This is more likely to develop in people who have high blood pressure readings or are overweight. Have your blood pressure checked: Every 3-5 years if you are 60-21 years of age. Every year if you are 85 years old or older. Eat a healthy diet  Eat a diet that includes plenty of vegetables, fruits, low-fat dairy products, and lean protein. Do not eat a lot of foods that are high in solid fats, added sugars, or sodium. Get regular exercise Get regular exercise. This is one of the most important things you can do for your health. Most adults should: Try to exercise for at least 150 minutes each week. The exercise should increase your heart rate and make you sweat (moderate-intensity exercise). Try to do strengthening exercises at least twice each week. Do these in addition to the moderate-intensity exercise. Spend less time sitting. Even light physical activity can be beneficial. Other tips Work with your health care provider to achieve or maintain a healthy weight. Do not use any products that contain nicotine or tobacco. These products include cigarettes, chewing tobacco, and vaping devices, such as e-cigarettes. If you need help  quitting, ask your health care provider. Know your numbers. Ask your health care provider to check your cholesterol and your blood sugar (glucose). Continue to have your blood tested as directed by your health care provider. Do I need  screening for cancer? Depending on your health history and family history, you may need to have cancer screenings at different stages of your life. This may include screening for: Breast cancer. Cervical cancer. Lung cancer. Colorectal cancer. What is my risk for osteoporosis? After menopause, you may be at increased risk for osteoporosis. Osteoporosis is a condition in which bone destruction happens more quickly than new bone creation. To help prevent osteoporosis or the bone fractures that can happen because of osteoporosis, you may take the following actions: If you are 52-74 years old, get at least 1,000 mg of calcium and at least 600 international units (IU) of vitamin D per day. If you are older than age 32 but younger than age 10, get at least 1,200 mg of calcium and at least 600 international units (IU) of vitamin D per day. If you are older than age 41, get at least 1,200 mg of calcium and at least 800 international units (IU) of vitamin D per day. Smoking and drinking excessive alcohol increase the risk of osteoporosis. Eat foods that are rich in calcium and vitamin D, and do weight-bearing exercises several times each week as directed by your health care provider. How does menopause affect my mental health? Depression may occur at any age, but it is more common as you become older. Common symptoms of depression include: Feeling depressed. Changes in sleep patterns. Changes in appetite or eating patterns. Feeling an overall lack of motivation or enjoyment of activities that you previously enjoyed. Frequent crying spells. Talk with your health care provider if you think that you are experiencing any of these symptoms. General instructions See your health care provider for regular wellness exams and vaccines. This may include: Scheduling regular health, dental, and eye exams. Getting and maintaining your vaccines. These include: Influenza vaccine. Get this vaccine each year before the  flu season begins. Pneumonia vaccine. Shingles vaccine. Tetanus, diphtheria, and pertussis (Tdap) booster vaccine. Your health care provider may also recommend other immunizations. Tell your health care provider if you have ever been abused or do not feel safe at home. Summary Menopause is a normal process in which your ability to get pregnant comes to an end. This condition causes hot flashes, night sweats, decreased interest in sex, mood swings, headaches, or lack of sleep. Treatment for this condition may include hormone replacement therapy. Take actions to keep yourself healthy, including exercising regularly, eating a healthy diet, watching your weight, and checking your blood pressure and blood sugar levels. Get screened for cancer and depression. Make sure that you are up to date with all your vaccines. This information is not intended to replace advice given to you by your health care provider. Make sure you discuss any questions you have with your health care provider. Document Revised: 08/04/2020 Document Reviewed: 08/04/2020 Elsevier Patient Education  [place].

## 2021-08-26 NOTE — Assessment & Plan Note (Signed)
Rx iron infusion.

## 2021-08-26 NOTE — Telephone Encounter (Signed)
Dr. Leo Grosser, Jul[facility] Rainier note:  Auth Submission: no auth needed Payer: [employer] Medication & CPT/J Code(s) submitted: Venofer (Iron Sucrose) J1756 Route of submission (phone, fax, portal): phone Auth type: Buy/Bill Units/visits requested: x5 doses Reference number: [id number] Approval from: 08/27/22 to 9/31/24   Patient will be scheduled as soon as possible

## 2021-08-26 NOTE — Assessment & Plan Note (Signed)
Given endorsing fatigue will order iron infusion. H/o constipation with oral iron replacement, hesitant to do oral in setting of increasing GLP1RA dose.

## 2021-08-26 NOTE — Patient Instructions (Signed)
We have sent the following medications to your pharmacy for you to pick up at your convenience: omeprazole.   Follow up with our office in one year and as needed.   The [facility] providers would like to encourage you to use [facility] to communicate with providers for non-urgent requests or questions.  Due to long hold times on the telephone, sending your provider a message by [facility] may be a faster and more efficient way to get a response.  Please allow 48 business hours for a response.  Please remember that this is for non-urgent requests.

## 2021-08-26 NOTE — Assessment & Plan Note (Signed)
Chronic, A1c above goal. Will increase trulicity to '3mg'$  weekly.  RTC 4 mo DM f/u visit.

## 2021-08-26 NOTE — Assessment & Plan Note (Signed)
Chronic, stable on atorvastatin and ezetimibe through cardiology. The ASCVD Risk score (Arnett DK, et al., 2019) failed to calculate for the following reasons:   The valid total cholesterol range is 130 to 320 mg/dL

## 2021-08-26 NOTE — Assessment & Plan Note (Signed)
Regularly sees GI - upcoming appt later today. Continues PPI

## 2021-08-26 NOTE — Assessment & Plan Note (Signed)
Preventative protocols reviewed and updated unless pt declined. Discussed healthy diet and lifestyle.  

## 2021-08-26 NOTE — Telephone Encounter (Signed)
Great - thanks

## 2021-08-27 NOTE — Progress Notes (Signed)
Agree with assessment as outlined, however given her persistent IDA and history of LA grade B esophagitis, a follow-up EGD is recommended to ensure no underlying Barrett's esophagus.  Jaclyn Shaggy can you help let the patient know and refer her to the schedule for coordinating this?  Thanks

## 2021-08-28 ENCOUNTER — Encounter: Payer: Self-pay | Admitting: Family Medicine

## 2021-08-28 ENCOUNTER — Ambulatory Visit (INDEPENDENT_AMBULATORY_CARE_PROVIDER_SITE_OTHER): Payer: 59

## 2021-08-28 VITALS — BP 106/68 | HR 86 | Temp 98.3°F | Resp 16 | Ht 65.5 in | Wt 174.4 lb

## 2021-08-28 DIAGNOSIS — D509 Iron deficiency anemia, unspecified: Secondary | ICD-10-CM

## 2021-08-28 DIAGNOSIS — R5383 Other fatigue: Secondary | ICD-10-CM

## 2021-08-28 DIAGNOSIS — E611 Iron deficiency: Secondary | ICD-10-CM

## 2021-08-28 MED ORDER — SODIUM CHLORIDE 0.9 % IV SOLN
200.0000 mg | Freq: Once | INTRAVENOUS | Status: AC
  Administered 2021-08-28: 200 mg via INTRAVENOUS
  Filled 2021-08-28: qty 10

## 2021-08-28 NOTE — Progress Notes (Signed)
Whitney [name], I called Ms. Rollene Rotunda and discussed to Dr. Doyne Keel recommendation to schedule an EGD to rule out Barrett's esophagus and any new development which may be contributing to her iron deficiency anemia.  She is agreeable to proceed with an EGD.  I discussed the procedure risk and benefits.  Please contact the patient Monday 6/5 to schedule an EGD.  Thank you

## 2021-08-28 NOTE — Progress Notes (Signed)
Diagnosis: Iron Deficiency Anemia  Provider:  Marshell Garfinkel, MD  Procedure: Infusion  IV Type: Peripheral, IV Location: R Forearm  Venofer (Iron Sucrose), Dose: 200 mg  Infusion Start Time: 2574  Infusion Stop Time: 1150  Post Infusion IV Care: Observation period completed and Peripheral IV Discontinued  Discharge: Condition: Good, Destination: Home . AVS provided to patient.   Performed by:  Adelina Mings, LPN

## 2021-08-31 ENCOUNTER — Encounter: Payer: Self-pay | Admitting: Family Medicine

## 2021-08-31 ENCOUNTER — Ambulatory Visit (INDEPENDENT_AMBULATORY_CARE_PROVIDER_SITE_OTHER): Payer: 59

## 2021-08-31 VITALS — BP 114/80 | HR 101 | Temp 98.2°F | Resp 18 | Ht 65.5 in | Wt 175.8 lb

## 2021-08-31 DIAGNOSIS — E611 Iron deficiency: Secondary | ICD-10-CM

## 2021-08-31 DIAGNOSIS — R5383 Other fatigue: Secondary | ICD-10-CM

## 2021-08-31 MED ORDER — SODIUM CHLORIDE 0.9 % IV SOLN
200.0000 mg | Freq: Once | INTRAVENOUS | Status: AC
  Administered 2021-08-31: 200 mg via INTRAVENOUS
  Filled 2021-08-31: qty 10

## 2021-08-31 NOTE — Progress Notes (Signed)
Diagnosis: Iron Deficiency Anemia  Provider:  Marshell Garfinkel, MD  Procedure: Infusion  IV Type: Peripheral, IV Location: R Hand  Venofer (Iron Sucrose), Dose: 200 mg  Infusion Start Time: 0102  Infusion Stop Time: 7253  Post Infusion IV Care: Peripheral IV Discontinued  Discharge: Condition: Good, Destination: Home . AVS provided to patient.   Performed by:  Adelina Mings, LPN

## 2021-09-02 ENCOUNTER — Encounter: Payer: Self-pay | Admitting: Family Medicine

## 2021-09-02 ENCOUNTER — Ambulatory Visit (INDEPENDENT_AMBULATORY_CARE_PROVIDER_SITE_OTHER): Payer: 59

## 2021-09-02 VITALS — BP 119/90 | HR 86 | Temp 98.2°F | Resp 16 | Ht 65.5 in | Wt 174.8 lb

## 2021-09-02 DIAGNOSIS — E611 Iron deficiency: Secondary | ICD-10-CM | POA: Diagnosis not present

## 2021-09-02 DIAGNOSIS — R5383 Other fatigue: Secondary | ICD-10-CM

## 2021-09-02 MED ORDER — SODIUM CHLORIDE 0.9 % IV SOLN
200.0000 mg | Freq: Once | INTRAVENOUS | Status: AC
  Administered 2021-09-02: 200 mg via INTRAVENOUS
  Filled 2021-09-02: qty 10

## 2021-09-02 NOTE — Progress Notes (Signed)
Diagnosis: Iron Deficiency Anemia  Provider:  Marshell Garfinkel, MD  Procedure: Infusion  IV Type: Peripheral, IV Location: L Hand  Venofer (Iron Sucrose), Dose: 200 mg  Infusion Start Time: 5947  Infusion Stop Time: 0761  Post Infusion IV Care: Peripheral IV Discontinued  Discharge: Condition: Good, Destination: Home . AVS provided to patient.   Performed by:  Adelina Mings, LPN

## 2021-09-04 ENCOUNTER — Ambulatory Visit (INDEPENDENT_AMBULATORY_CARE_PROVIDER_SITE_OTHER): Payer: 59

## 2021-09-04 VITALS — BP 112/74 | HR 99 | Temp 98.1°F | Resp 18

## 2021-09-04 DIAGNOSIS — R5383 Other fatigue: Secondary | ICD-10-CM | POA: Diagnosis not present

## 2021-09-04 DIAGNOSIS — E611 Iron deficiency: Secondary | ICD-10-CM | POA: Diagnosis not present

## 2021-09-04 MED ORDER — SODIUM CHLORIDE 0.9 % IV SOLN
200.0000 mg | Freq: Once | INTRAVENOUS | Status: AC
  Administered 2021-09-04: 200 mg via INTRAVENOUS
  Filled 2021-09-04: qty 10

## 2021-09-04 NOTE — Progress Notes (Signed)
Diagnosis: Iron Deficiency Anemia  Provider:  Marshell Garfinkel, MD  Procedure: Infusion  IV Type: Peripheral, IV Location: L Antecubital  Venofer (Iron Sucrose), Dose: 200 mg  Infusion Start Time: 4383  Infusion Stop Time: 1130  Post Infusion IV Care: Peripheral IV Discontinued  Discharge: Condition: Good, Destination: Home . AVS provided to patient.   Performed by:  Paul Dykes, RN

## 2021-09-07 ENCOUNTER — Telehealth: Payer: Self-pay

## 2021-09-07 ENCOUNTER — Ambulatory Visit (INDEPENDENT_AMBULATORY_CARE_PROVIDER_SITE_OTHER): Payer: 59

## 2021-09-07 VITALS — BP 102/60 | HR 86 | Temp 97.4°F | Resp 18 | Ht 65.0 in | Wt 173.0 lb

## 2021-09-07 DIAGNOSIS — R5383 Other fatigue: Secondary | ICD-10-CM | POA: Diagnosis not present

## 2021-09-07 DIAGNOSIS — E611 Iron deficiency: Secondary | ICD-10-CM

## 2021-09-07 MED ORDER — SODIUM CHLORIDE 0.9 % IV SOLN
200.0000 mg | Freq: Once | INTRAVENOUS | Status: AC
  Administered 2021-09-07: 200 mg via INTRAVENOUS
  Filled 2021-09-07: qty 10

## 2021-09-07 NOTE — Telephone Encounter (Signed)
Pt was made aware of recommendations: Pt stated that they recently had a death in the family and there is a lot going on right now. Pt stated that she would like to have this done in the Fall of the year. Pt was notified to call our office when she is ready and we will get her scheduled. Pt verbalized understanding with all questions answered.

## 2021-09-07 NOTE — Telephone Encounter (Signed)
-----   Message from Algernon Huxley, RN sent at 09/03/2021  9:04 AM EDT -----  ----- Message ----- From: Noralyn Pick, NP Sent: 08/28/2021   4:05 PM EDT To: Aleatha Borer, LPN     ----- Message ----- From: Yetta Flock, MD Sent: 08/27/2021  12:55 PM EDT To: Noralyn Pick, NP     ----- Message ----- From: Noralyn Pick, NP Sent: 08/26/2021   8:13 PM EDT To: Yetta Flock, MD

## 2021-09-07 NOTE — Progress Notes (Signed)
Diagnosis: Iron Deficiency Anemia  Provider:  Marshell Garfinkel, MD  Procedure: Infusion  IV Type: Peripheral, IV Location: L Antecubital  Venofer (Iron Sucrose), Dose: 200 mg  Infusion Start Time: 1101  Infusion Stop Time: 1125  Post Infusion IV Care: Peripheral IV Discontinued  Discharge: Condition: Good, Destination: Home . AVS provided to patient.   Performed by:  Arnoldo Morale, RN

## 2021-09-21 ENCOUNTER — Encounter: Payer: Self-pay | Admitting: Family Medicine

## 2021-09-23 MED ORDER — CYCLOBENZAPRINE HCL 5 MG PO TABS
5.0000 mg | ORAL_TABLET | Freq: Every evening | ORAL | 1 refills | Status: DC | PRN
Start: 2021-09-23 — End: 2021-11-20

## 2021-09-28 ENCOUNTER — Encounter: Payer: 59 | Attending: Family Medicine | Admitting: Dietician

## 2021-09-28 ENCOUNTER — Encounter: Payer: Self-pay | Admitting: Dietician

## 2021-09-28 DIAGNOSIS — E1169 Type 2 diabetes mellitus with other specified complication: Secondary | ICD-10-CM | POA: Diagnosis not present

## 2021-09-28 DIAGNOSIS — Z713 Dietary counseling and surveillance: Secondary | ICD-10-CM | POA: Insufficient documentation

## 2021-09-28 NOTE — Patient Instructions (Addendum)
Consider CoQ10 as you are on a statin  Consider almond milk rather than whole milk Walking, sit and be fit (you tube), or other exercise most days as your body tolerates and allowed by MD. Add greens to your protein shake  Plan:  Aim for 2 Carb Choices per meal (30 grams) +/- 1 either way  Aim for 0-2 Carbs per snack if hungry  Include protein in moderation with your meals and snacks Consider reading food labels for Total Carbohydrate of foods Continue checking BG at alternate times per day  Continue t  aking medication as directed by MD  Call if you would like training on the [place] (continuous glucose monitor) or for further education as desired.

## 2021-09-28 NOTE — Progress Notes (Signed)
Diabetes Self-Management Education  Visit Type: First/Initial  Appt. Start Time: 0945 Appt. End Time: 1055  09/28/2021  Ms. Whitney [name], identified by name and date of birth, is a 62 y.o. female with a diagnosis of Diabetes: Type 2.   ASSESSMENT Patient is here today with her mother.  She was seen last in [place] [facility] classes in  2016.  History includes Type 2 Diabetes, GERD, HLD, neuropathy, depression, Barrett's esophagus Medications include:  Trulicity, Jardiance, metformin (XR), lipitor, zetia, colchicine, omega 3, vitamin D3, recent iron infusion, vitamin B-12 A1C 8% 08/19/2021, GFR>60 2022 CGM:  prescription for the [place] but is not using at this time.  Weight hx: 65.5" 172 lbs Lost 30 lbs over the past 1-1.5 years through [place] (using the shakes) Goal weight 160 lbs  Patient lives with her husband.  She does the shopping and cooking. 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  She also watches her grandchildren. Back pain flaring and recent pericarditis which is making exercise challenging. She has a total gym and has not been able to use due to her back. Avoids red meat most often.  Height 5' 5.5" (1.664 m), weight 172 lb (78 kg), last menstrual period 06/17/2011. Body mass index is 28.19 kg/m.   Diabetes Self-Management Education - 09/28/21 1002       Visit Information   Visit Type First/Initial      Initial Visit   Diabetes Type Type 2    Date Diagnosed 2016    Are you currently following a meal plan? No    Are you taking your medications as prescribed? Yes      Health Coping   How would you rate your overall health? Fair      Psychosocial Assessment   Patient Belief/Attitude about Diabetes Motivated to manage diabetes    What is the hardest part about your diabetes right now, causing you the most concern, or is the most worrisome to you about your diabetes?   Taking/obtaining medications    Self-care barriers None     Self-management support Doctor's office    Other persons present Patient    Patient Concerns Nutrition/Meal planning    Special Needs None    Preferred Learning Style No preference indicated    Learning Readiness Ready    How often do you need to have someone help you when you read instructions, pamphlets, or other written materials from your doctor or pharmacy? 1 - Never    What is the last grade level you completed in school? 2 years college      Pre-Education Assessment   Patient understands the diabetes disease and treatment process. Needs Review    Patient understands incorporating nutritional management into lifestyle. Needs Review    Patient undertands incorporating physical activity into lifestyle. Needs Review    Patient understands using medications safely. Needs Review    Patient understands monitoring blood glucose, interpreting and using results Needs Review    Patient understands prevention, detection, and treatment of acute complications. Needs Review    Patient understands prevention, detection, and treatment of chronic complications. Needs Review    Patient understands how to develop strategies to address psychosocial issues. Needs Review    Patient understands how to develop strategies to promote health/change behavior. Needs Review      Complications   Last HgB A1C per patient/outside source 8 %   08/19/2021 increased from 7.8%   How often do you check your blood sugar? 1-2 times/day  Fasting Blood glucose range (mg/dL) 130-179   140   Postprandial Blood glucose range (mg/dL) 180-200;130-179    Number of hypoglycemic episodes per month 0    Number of hyperglycemic episodes ( >'200mg'$ /dL): Weekly    Can you tell when your blood sugar is high? Yes    What do you do if your blood sugar is high? drinks more water    Have you had a dilated eye exam in the past 12 months? Yes    Have you had a dental exam in the past 12 months? No    Are you checking your feet? No       Dietary Intake   Breakfast 2 eggs, [place] OR 1 egg, 1 slice Ezekiel toast and occasional strip of bacon OR rare oatmeal    Lunch PB and banana sandwich (1/2) on [employer] bread OR grilled chicken, baked beans, slaw or greens OR ghasson's    Snack (afternoon) smoothie (protein shake, 1/2 banana, berries, occasional spinach, almond milk - unsweetened)    Dinner chicken or salmon, vegetable, occasional sweet potato OR [place] burger on thin bun and slaw    Beverage(s) water, whole milk, flavored water, occasional coffee with stevia and optivia powder      Activity / Exercise   Activity / Exercise Type Light (walking / raking leaves)    How many days per week do you exercise? 1    How many minutes per day do you exercise? 20    Total minutes per week of exercise 20      Patient Education   Previous Diabetes Education Yes (please comment)   core classes in [place] when diagnosed   Disease Pathophysiology Other (comment)   review of insulin resistance   Healthy Eating Meal options for control of blood glucose level and chronic complications.;Food label reading, portion sizes and measuring food.;Plate Method    Being Active Role of exercise on diabetes management, blood pressure control and cardiac health.;Helped patient identify appropriate exercises in relation to his/her diabetes, diabetes complications and other health issue.    Medications Reviewed patients medication for diabetes, action, purpose, timing of dose and side effects.    Monitoring Identified appropriate SMBG and/or A1C goals.;Daily foot exams    Diabetes Stress and Support Role of stress on diabetes      Individualized Goals (developed by patient)   Nutrition General guidelines for healthy choices and portions discussed    Physical Activity Exercise 3-5 times per week;15 minutes per day    Medications take my medication as prescribed    Monitoring  Test my blood glucose as discussed   Consider CGM   Problem Solving Other  (comment)   CGM   Reducing Risk do foot checks daily;examine blood glucose patterns      Patient Self-Evaluation of Goals - Patient rates self as meeting previously set goals (% of time)   Nutrition >75% (most of the time)    Physical Activity < 25% (hardly ever/never)    Medications >75% (most of the time)    Monitoring >75% (most of the time)    Problem Solving and behavior change strategies  >75% (most of the time)    Reducing Risk (treating acute and chronic complications) 50 - 75 % (half of the time)    Health Coping 50 - 75 % (half of the time)      Post-Education Assessment   Patient understands the diabetes disease and treatment process. Demonstrates understanding / competency    Patient understands  incorporating nutritional management into lifestyle. Comprehends key points    Patient undertands incorporating physical activity into lifestyle. Comprehends key points    Patient understands using medications safely. Demonstrates understanding / competency    Patient understands monitoring blood glucose, interpreting and using results Demonstrates understanding / competency    Patient understands prevention, detection, and treatment of acute complications. Demonstrates understanding / competency    Patient understands prevention, detection, and treatment of chronic complications. Demonstrates understanding / competency    Patient understands how to develop strategies to address psychosocial issues. Comprehends key points    Patient understands how to develop strategies to promote health/change behavior. Comprehends key points      Outcomes   Expected Outcomes Demonstrated interest in learning. Expect positive outcomes    Future DMSE PRN    Program Status Completed             Individualized Plan for Diabetes Self-Management Training:   Learning Objective:  Patient will have a greater understanding of diabetes self-management. Patient education plan is to attend individual  and/or group sessions per assessed needs and concerns.   Plan:   Patient Instructions  Consider CoQ10 as you are on a statin  Consider almond milk rather than whole milk Walking, sit and be fit (you tube), or other exercise most days as your body tolerates and allowed by MD. Add greens to your protein shake  Plan:  Aim for 2 Carb Choices per meal (30 grams) +/- 1 either way  Aim for 0-2 Carbs per snack if hungry  Include protein in moderation with your meals and snacks Consider reading food labels for Total Carbohydrate of foods Continue checking BG at alternate times per day  Continue t  aking medication as directed by MD    Expected Outcomes:  Demonstrated interest in learning. Expect positive outcomes  Education material provided: ADA - How to Thrive: A Guide for Your Journey with Diabetes, Meal plan card, Snack sheet, and Diabetes Resources; 1500 calorie 5 day sample meal plans from AND  If problems or questions, patient to contact team via:  Phone  Future DSME appointment: PRN

## 2021-10-29 ENCOUNTER — Encounter: Payer: Self-pay | Admitting: Family Medicine

## 2021-10-29 NOTE — Telephone Encounter (Signed)
Fyi to Dr. G 

## 2021-10-29 NOTE — Telephone Encounter (Signed)
Spoke with pt and r/s OV to tomorrow at 2:00.  Pt expresses her thanks.

## 2021-10-30 ENCOUNTER — Ambulatory Visit: Payer: 59 | Admitting: Family Medicine

## 2021-10-30 ENCOUNTER — Encounter: Payer: Self-pay | Admitting: Family Medicine

## 2021-10-30 VITALS — BP 124/66 | HR 93 | Temp 97.4°F | Ht 65.5 in | Wt 174.0 lb

## 2021-10-30 DIAGNOSIS — E1169 Type 2 diabetes mellitus with other specified complication: Secondary | ICD-10-CM

## 2021-10-30 DIAGNOSIS — F418 Other specified anxiety disorders: Secondary | ICD-10-CM | POA: Insufficient documentation

## 2021-10-30 MED ORDER — HYDROXYZINE HCL 25 MG PO TABS: 12.5000 mg | ORAL_TABLET | Freq: Three times a day (TID) | ORAL | 1 refills | Status: DC | PRN

## 2021-10-30 MED ORDER — DESVENLAFAXINE SUCCINATE ER 50 MG PO TB24: 50.0000 mg | ORAL_TABLET | Freq: Every day | ORAL | 3 refills | Status: DC

## 2021-10-30 NOTE — Assessment & Plan Note (Addendum)
Predominant anxiety due to husband's recent mental health crisis.  This is affecting her ability to do day to day activities.  Support provided. Will start Pristiq '50mg'$  daily, as well as hydroxyzine 12.5-'25mg'$  prn.  Reviewed possible side effects to monitor for. No SI/HI. Update with effect.  Pt agrees with plan.

## 2021-10-30 NOTE — Assessment & Plan Note (Signed)
I did ask her to schedule 2 mo DM f/u visit.

## 2021-10-30 NOTE — Patient Instructions (Addendum)
Start pristiq '50mg'$  daily sent to pharmacy Start hydroxyzine '25mg'$  1/2-1 tablet 2-3 times a day as needed for anxiety/stress.  Keep me updated with how you're doing.  Return in 2-3 months for diabetes follow up visit.

## 2021-10-30 NOTE — Progress Notes (Signed)
Patient ID: Whitney [name], female    DOB: [birth year], 61 y.o.   MRN: [id number]  This visit was conducted in person.  BP 124/66   Pulse 93   Temp (!) 97.4 F (36.3 C) (Temporal)   Ht 5' 5.5" (1.664 m)   Wt 174 lb (78.9 kg)   LMP 06/17/2011   SpO2 96%   BMI 28.51 kg/m    CC: discuss anxiety  Subjective:   HPI: Whitney [name] is a 61 y.o. female presenting on 10/30/2021 for Anxiety (Wants to discuss anxiety and starting meds. )   Significant stress due to husband's deteriorated mental health over the past 6 months while he was caring for his father who passed away 18-Jul-2021. This has affected her own mental health. She is having a hard time dealing with all of this. Feels "spent".   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 She is a "people-pleaser". Having difficulty organizing thoughts, at times anxiety has made her physically feel sick in h/o post-COVID viral pericarditis. Anxiety > depression.   Remotely on effexor for work stressors.  Sister on Pristiq and doing well.  Zoloft caused headaches/vision changes when previously tried.   No SI/HI.  No fmhx bipolar disorder  No h/o glaucoma     Relevant past medical, surgical, family and social history reviewed and updated as indicated. Interim medical history since our last visit reviewed. Allergies and medications reviewed and updated. Outpatient Medications Prior to Visit  Medication Sig Dispense Refill   ASPIRIN PO Take 1 tablet by mouth daily.     atorvastatin (LIPITOR) 40 MG tablet Take 40 mg by mouth daily.     Blood Glucose Monitoring Suppl (FREESTYLE LITE) DEVI Use as directed to check blood sugars 1 each 0   Cholecalciferol (VITAMIN D3) 50 MCG (2000 UT) capsule Take 2,000 Units by mouth daily.     Coenzyme Q10 (CO Q10 PO) Take by mouth daily.     colchicine 0.6 MG tablet Take 1 tablet (0.6 mg total) by mouth daily.     Continuous Blood Gluc Receiver  (FREESTYLE LIBRE 2 READER) DEVI Use as directed to check blood sugars. Dx E11.9 1 each 0   Continuous Blood Gluc Sensor (FREESTYLE LIBRE 2 SENSOR) MISC Apply to skin every 14 days for continuous blood glucose monitoring. 2 each 2   Cyanocobalamin (VITAMIN B-12) 1000 MCG SUBL Place under the tongue daily. Liquid     cyclobenzaprine (FLEXERIL) 5 MG tablet Take 1 tablet (5 mg total) by mouth at bedtime as needed for muscle spasms. 30 tablet 1   Dulaglutide (TRULICITY) 3 JK/9.3OI SOPN Inject 3 mg as directed once a week. 6 mL 3   ezetimibe (ZETIA) 10 MG tablet Take 10 mg by mouth daily.     JARDIANCE 25 MG TABS tablet Take 25 mg by mouth daily.     metFORMIN (GLUCOPHAGE-XR) 500 MG 24 hr tablet Take 1 tablet (500 mg total) by mouth in the morning and at bedtime. 180 tablet 3   Omega-3 Fatty Acids (FISH OIL) 1200 MG CPDR Take 1,200 mg by mouth daily.     omeprazole (PRILOSEC) 40 MG capsule Take 1 capsule (40 mg total) by mouth daily. 90 capsule 3   triamcinolone cream (KENALOG) 0.1 % APPLY 1 APPLICATION TOPICALLY 2 (TWO) TIMES DAILY. 30 g 1   fluticasone (FLONASE) 50 MCG/ACT nasal spray PLACE 2 SPRAYS INTO BOTH NOSTRILS AS NEEDED FOR  ALLERGIES. 48 mL 1   No facility-administered medications prior to visit.     Per HPI unless specifically indicated in ROS section below Review of Systems  Objective:  BP 124/66   Pulse 93   Temp (!) 97.4 F (36.3 C) (Temporal)   Ht 5' 5.5" (1.664 m)   Wt 174 lb (78.9 kg)   LMP 06/17/2011   SpO2 96%   BMI 28.51 kg/m   Wt Readings from Last 3 Encounters:  10/30/21 174 lb (78.9 kg)  09/28/21 172 lb (78 kg)  09/07/21 173 lb (78.5 kg)      Physical Exam Vitals and nursing note reviewed.  Constitutional:      Appearance: Normal appearance. She is not ill-appearing.  HENT:     Mouth/Throat:     Mouth: Mucous membranes are moist.     Pharynx: Oropharynx is clear. No oropharyngeal exudate or posterior oropharyngeal erythema.  Eyes:     Extraocular  Movements: Extraocular movements intact.     Conjunctiva/sclera: Conjunctivae normal.     Pupils: Pupils are equal, round, and reactive to light.  Cardiovascular:     Rate and Rhythm: Normal rate and regular rhythm.     Pulses: Normal pulses.     Heart sounds: Normal heart sounds. No murmur heard. Pulmonary:     Effort: Pulmonary effort is normal. No respiratory distress.     Breath sounds: Normal breath sounds. No wheezing, rhonchi or rales.  Musculoskeletal:     Right lower leg: No edema.     Left lower leg: No edema.  Skin:    General: Skin is warm and dry.     Findings: No rash.  Neurological:     Mental Status: She is alert.  Psychiatric:        Mood and Affect: Mood normal.        Behavior: Behavior normal.       Results for orders placed or performed in visit on 09/02/21  HM DIABETES EYE EXAM  Result Value Ref Range   HM Diabetic Eye Exam No Retinopathy No Retinopathy      10/30/2021    2:41 PM 09/28/2021    9:50 AM 08/26/2021    8:09 AM 12/24/2019    8:42 AM 09/14/2019   10:20 AM  Depression screen PHQ 2/9  Decreased Interest 0 0 0 0 0  Down, Depressed, Hopeless 1 0 0 0 0  PHQ - 2 Score 1 0 0 0 0  Altered sleeping 0   1 0  Tired, decreased energy 2   2 0  Change in appetite 0   3 0  Feeling bad or failure about yourself  0   0 0  Trouble concentrating 0   0 0  Moving slowly or fidgety/restless 0   0 0  Suicidal thoughts 0   0 0  PHQ-9 Score 3   6 0  Difficult doing work/chores Somewhat difficult   Somewhat difficult Not difficult at all      10/30/2021    2:41 PM 09/14/2019   10:20 AM 07/02/2019    4:10 PM 03/31/2018    8:49 AM  GAD 7 : Generalized Anxiety Score  Nervous, Anxious, on Edge 2 0 3 3  Control/stop worrying 0 0 3 0  Worry too much - different things 0 0 3 1  Trouble relaxing 1 0 1 1  Restless 0 0 0 1  Easily annoyed or irritable 0 0 2 0  Afraid - awful might happen  1 0 0 0  Total GAD 7 Score 4 0 12 6  Anxiety Difficulty Somewhat difficult Not  difficult at all Very difficult Somewhat difficult   Assessment & Plan:   Problem List Items Addressed This Visit     Type 2 diabetes mellitus with other specified complication ([place])    I did ask her to schedule 2 mo DM f/u visit.       Situational anxiety - Primary    Predominant anxiety due to husband's recent mental health crisis.  This is affecting her ability to do day to day activities.  Support provided. Will start Pristiq '50mg'$  daily, as well as hydroxyzine 12.5-'25mg'$  prn.  Reviewed possible side effects to monitor for. No SI/HI. Update with effect.  Pt agrees with plan.       Relevant Medications   desvenlafaxine (PRISTIQ) 50 MG 24 hr tablet   hydrOXYzine (ATARAX) 25 MG tablet     Meds ordered this encounter  Medications   desvenlafaxine (PRISTIQ) 50 MG 24 hr tablet    Sig: Take 1 tablet (50 mg total) by mouth daily.    Dispense:  30 tablet    Refill:  3   hydrOXYzine (ATARAX) 25 MG tablet    Sig: Take 0.5-1 tablets (12.5-25 mg total) by mouth 3 (three) times daily as needed for anxiety (sedation precautions).    Dispense:  30 tablet    Refill:  1   No orders of the defined types were placed in this encounter.   Patient Instructions  Start pristiq '50mg'$  daily sent to pharmacy Start hydroxyzine '25mg'$  1/2-1 tablet 2-3 times a day as needed for anxiety/stress.  Keep me updated with how you're doing.  Return in 2-3 months for diabetes follow up visit.   Follow up plan: Return if symptoms worsen or fail to improve.  Ria Bush, MD

## 2021-10-30 NOTE — Telephone Encounter (Signed)
Seen patient in office today.

## 2021-11-03 ENCOUNTER — Encounter: Payer: Self-pay | Admitting: Family Medicine

## 2021-11-03 ENCOUNTER — Ambulatory Visit: Payer: 59 | Admitting: Family Medicine

## 2021-11-04 ENCOUNTER — Encounter: Payer: 59 | Admitting: Registered"

## 2021-11-19 ENCOUNTER — Other Ambulatory Visit: Payer: Self-pay | Admitting: Family Medicine

## 2021-11-20 NOTE — Telephone Encounter (Signed)
Refill request Cyclobenzaprine Last office visit 10/30/21 Last refill 09/23/21  #30/1

## 2021-12-30 ENCOUNTER — Ambulatory Visit (INDEPENDENT_AMBULATORY_CARE_PROVIDER_SITE_OTHER): Payer: 59 | Admitting: *Deleted

## 2021-12-30 DIAGNOSIS — Z23 Encounter for immunization: Secondary | ICD-10-CM

## 2022-02-17 ENCOUNTER — Other Ambulatory Visit: Payer: Self-pay | Admitting: Family Medicine

## 2022-02-20 ENCOUNTER — Telehealth: Payer: 59 | Admitting: Physician Assistant

## 2022-02-20 DIAGNOSIS — U071 COVID-19: Secondary | ICD-10-CM

## 2022-02-20 MED ORDER — NIRMATRELVIR/RITONAVIR (PAXLOVID)TABLET: 3.0000 | ORAL_TABLET | Freq: Two times a day (BID) | ORAL | 0 refills | Status: AC

## 2022-02-20 NOTE — Patient Instructions (Signed)
Chaney Born, thank you for joining Mar Daring, PA-C for today's virtual visit.  While this provider is not your primary care provider (PCP), if your PCP is located in our provider database this encounter information will be shared with them immediately following your visit.   [place] [facility] account gives you access to today's visit and all your visits, tests, and labs performed at [facility] " click here if you don't have a [facility] account or go to [email or web address]  Consent: (Patient) Chaney Born provided verbal consent for this virtual visit at the beginning of the encounter.  Current Medications:  Current Outpatient Medications:    nirmatrelvir/ritonavir EUA (PAXLOVID) 20 x 150 MG & 10 x '100MG'$  TABS, Take 3 tablets by mouth 2 (two) times daily for 5 days. (Take nirmatrelvir 150 mg two tablets twice daily for 5 days and ritonavir 100 mg one tablet twice daily for 5 days) Patient GFR is 85, Disp: 30 tablet, Rfl: 0   ASPIRIN PO, Take 1 tablet by mouth daily., Disp: , Rfl:    atorvastatin (LIPITOR) 40 MG tablet, Take 40 mg by mouth daily., Disp: , Rfl:    Blood Glucose Monitoring Suppl (FREESTYLE LITE) DEVI, Use as directed to check blood sugars, Disp: 1 each, Rfl: 0   Cholecalciferol (VITAMIN D3) 50 MCG (2000 UT) capsule, Take 2,000 Units by mouth daily., Disp: , Rfl:    Coenzyme Q10 (CO Q10 PO), Take by mouth daily., Disp: , Rfl:    colchicine 0.6 MG tablet, Take 1 tablet (0.6 mg total) by mouth daily., Disp: , Rfl:    Continuous Blood Gluc Receiver (FREESTYLE LIBRE 2 READER) DEVI, Use as directed to check blood sugars. Dx E11.9, Disp: 1 each, Rfl: 0   Continuous Blood Gluc Sensor (FREESTYLE LIBRE 2 SENSOR) MISC, Apply to skin every 14 days for continuous blood glucose monitoring., Disp: 2 each, Rfl: 2   Cyanocobalamin (VITAMIN B-12) 1000 MCG SUBL, Place under the tongue daily. Liquid, Disp: , Rfl:    cyclobenzaprine (FLEXERIL) 5 MG tablet,  TAKE 1 TABLET BY MOUTH AT BEDTIME AS NEEDED FOR MUSCLE SPASMS., Disp: 30 tablet, Rfl: 1   Dulaglutide (TRULICITY) 3 XQ/1.1HE SOPN, Inject 3 mg as directed once a week., Disp: 6 mL, Rfl: 3   ezetimibe (ZETIA) 10 MG tablet, Take 10 mg by mouth daily., Disp: , Rfl:    fluticasone (FLONASE) 50 MCG/ACT nasal spray, PLACE 2 SPRAYS INTO BOTH NOSTRILS AS NEEDED FOR ALLERGIES., Disp: 48 mL, Rfl: 1   hydrOXYzine (ATARAX) 25 MG tablet, Take 0.5-1 tablets (12.5-25 mg total) by mouth 3 (three) times daily as needed for anxiety (sedation precautions)., Disp: 30 tablet, Rfl: 1   JARDIANCE 25 MG TABS tablet, Take 25 mg by mouth daily., Disp: , Rfl:    metFORMIN (GLUCOPHAGE-XR) 500 MG 24 hr tablet, Take 1 tablet (500 mg total) by mouth in the morning and at bedtime., Disp: 180 tablet, Rfl: 3   Omega-3 Fatty Acids (FISH OIL) 1200 MG CPDR, Take 1,200 mg by mouth daily., Disp: , Rfl:    omeprazole (PRILOSEC) 40 MG capsule, Take 1 capsule (40 mg total) by mouth daily., Disp: 90 capsule, Rfl: 3   triamcinolone cream (KENALOG) 0.1 %, APPLY 1 APPLICATION TOPICALLY 2 (TWO) TIMES DAILY., Disp: 30 g, Rfl: 1   Medications ordered in this encounter:  Meds ordered this encounter  Medications   nirmatrelvir/ritonavir EUA (PAXLOVID) 20 x 150 MG & 10 x '100MG'$  TABS    Sig:  Take 3 tablets by mouth 2 (two) times daily for 5 days. (Take nirmatrelvir 150 mg two tablets twice daily for 5 days and ritonavir 100 mg one tablet twice daily for 5 days) Patient GFR is 85    Dispense:  30 tablet    Refill:  0    Order Specific Question:   Supervising Provider    Answer:   Chase Picket [id number]     *If you need refills on other medications prior to your next appointment, please contact your pharmacy*  Follow-Up: Call back or seek an in-person evaluation if the symptoms worsen or if the condition fails to improve as anticipated.  [place]illian [phone]  Other Instructions  hold Atorvastatin (Lipitor) and  Ezetimibe (Zetia)   COVID-19 COVID-19, or coronavirus disease 2019, is an infection that is caused by a new (novel) coronavirus called SARS-CoV-2. COVID-19 can cause many symptoms. In some people, the virus may not cause any symptoms. In others, it may cause mild or severe symptoms. Some people with severe infection develop severe disease. What are the causes? This illness is caused by a virus. The virus may be in the air as tiny specks of fluid (aerosols) or droplets, or it may be on surfaces. You may catch the virus by: Breathing in droplets from an infected person. Droplets can be spread by a person breathing, speaking, singing, coughing, or sneezing. Touching something, like a table or a doorknob, that has virus on it (is contaminated) and then touching your mouth, nose, or eyes. What increases the risk? Risk for infection: You are more likely to get infected with the COVID-19 virus if: You are within 6 ft (1.8 m) of a person with COVID-19 for 15 minutes or longer. You are providing care for a person who is infected with COVID-19. You are in close personal contact with other people. Close personal contact includes hugging, kissing, or sharing eating or drinking utensils. Risk for serious illness caused by COVID-19: You are more likely to get seriously ill from the COVID-19 virus if: You have cancer. You have a long-term (chronic) disease, such as: Chronic lung disease. This includes pulmonary embolism, chronic obstructive pulmonary disease, and cystic fibrosis. Long-term disease that lowers your body's ability to fight infection (immunocompromise). Serious cardiac conditions, such as heart failure, coronary artery disease, or cardiomyopathy. Diabetes. Chronic kidney disease. Liver diseases. These include cirrhosis, nonalcoholic fatty liver disease, alcoholic liver disease, or autoimmune hepatitis. You have obesity. You are pregnant or were recently pregnant. You have sickle cell  disease. What are the signs or symptoms? Symptoms of this condition can range from mild to severe. Symptoms may appear any time from 2 to 14 days after being exposed to the virus. They include: Fever or chills. Shortness of breath or trouble breathing. Feeling tired or very tired. Headaches, body aches, or muscle aches. Runny or stuffy nose, sneezing, coughing, or sore throat. New loss of taste or smell. This is rare. Some people may also have stomach problems, such as nausea, vomiting, or diarrhea. Other people may not have any symptoms of COVID-19. How is this diagnosed? This condition may be diagnosed by testing samples to check for the COVID-19 virus. The most common tests are the PCR test and the antigen test. Tests may be done in the lab or at home. They include: Using a swab to take a sample of fluid from the back of your nose and throat (nasopharyngeal fluid), from your nose, or from your throat. Testing  a sample of saliva from your mouth. Testing a sample of coughed-up mucus from your lungs (sputum). How is this treated? Treatment for COVID-19 infection depends on the severity of the condition. Mild symptoms can be managed at home with rest, fluids, and over-the-counter medicines. Serious symptoms may be treated in a hospital intensive care unit (ICU). Treatment in the ICU may include: Supplemental oxygen. Extra oxygen is given through a tube in the nose, a face mask, or a hood. Medicines. These may include: Antivirals, such as monoclonal antibodies. These help your body fight off certain viruses that can cause disease. Anti-inflammatories, such as corticosteroids. These reduce inflammation and suppress the immune system. Antithrombotics. These prevent or treat blood clots, if they develop. Convalescent plasma. This helps boost your immune system, if you have an underlying immunosuppressive condition or are getting immunosuppressive treatments. Prone positioning. This means you  will lie on your stomach. This helps oxygen to get into your lungs. Infection control measures. If you are at risk for more serious illness caused by COVID-19, your health care provider may prescribe two long-acting monoclonal antibodies, given together every 6 months. How is this prevented? To protect yourself: Use preventive medicine (pre-exposure prophylaxis). You may get pre-exposure prophylaxis if you have moderate or severe immunocompromise. Get vaccinated. Anyone 29 months old or older who meets guidelines can get a COVID-19 vaccine or vaccine series. This includes people who are pregnant or making breast milk (lactating). Get an added dose of COVID-19 vaccine after your first vaccine or vaccine series if you have moderate to severe immunocompromise. This applies if you have had a solid organ transplant or have been diagnosed with an immunocompromising condition. You should get the added dose 4 weeks after you got the first COVID-19 vaccine or vaccine series. If you get an mRNA vaccine, you will need a 3-dose primary series. If you get the J&J/Janssen vaccine, you will need a 2-dose primary series, with the second dose being an mRNA vaccine. Talk to your health care provider about getting experimental monoclonal antibodies. This treatment is approved under emergency use authorization to prevent severe illness before or after being exposed to the COVID-19 virus. You may be given monoclonal antibodies if: You have moderate or severe immunocompromise. This includes treatments that lower your immune response. People with immunocompromise may not develop protection against COVID-19 when they are vaccinated. You cannot be vaccinated. You may not get a vaccine if you have a severe allergic reaction to the vaccine or its components. You are not fully vaccinated. You are in a facility where COVID-19 is present and: Are in close contact with a person who is infected with the COVID-19 virus. Are at high  risk of being exposed to the COVID-19 virus. You are at risk of illness from new variants of the COVID-19 virus. To protect others: If you have symptoms of COVID-19, take steps to prevent the virus from spreading to others. Stay home. Leave your house only to get medical care. Do not use public transit, if possible. Do not travel while you are sick. Wash your hands often with soap and water for at least 20 seconds. If soap and water are not available, use alcohol-based hand sanitizer. Make sure that all people in your household wash their hands well and often. Cough or sneeze into a tissue or your sleeve or elbow. Do not cough or sneeze into your hand or into the air. Where to find more information Centers for Disease Control and Prevention: CharmCourses.be World Health Organization:  https://www.castaneda.info/ Get help right away if: You have trouble breathing. You have pain or pressure in your chest. You are confused. You have bluish lips and fingernails. You have trouble waking from sleep. You have symptoms that get worse. These symptoms may be an emergency. Get help right away. Call 911. Do not wait to see if the symptoms will go away. Do not drive yourself to the hospital. Summary COVID-19 is an infection that is caused by a new coronavirus. Sometimes, there are no symptoms. Other times, symptoms range from mild to severe. Some people with a severe COVID-19 infection develop severe disease. The virus that causes COVID-19 can spread from person to person through droplets or aerosols from breathing, speaking, singing, coughing, or sneezing. Mild symptoms of COVID-19 can be managed at home with rest, fluids, and over-the-counter medicines. This information is not intended to replace advice given to you by your health care provider. Make sure you discuss any questions you have with your health care provider. Document Revised: 03/03/2021 Document Reviewed:  03/05/2021 Elsevier Patient Education  [place].   If you have been instructed to have an in-person evaluation today at a local Urgent Care facility, please use the link below. It will take you to a list of all of our available [facility], including address, phone number and hours of operation. Please do not delay care.  [facility]  If you or a family member do not have a primary care provider, use the link below to schedule a visit and establish care. When you choose a [facility] primary care physician or advanced practice provider, you gain a long-term partner in health. Find a Primary Care Provider  Learn more about [facility]'s in-office and virtual care options: [facility]

## 2022-02-20 NOTE — Progress Notes (Signed)
Virtual Visit Consent   Whitney [name], you are scheduled for a virtual visit with a [facility] provider today. Just as with appointments in the office, your consent must be obtained to participate. Your consent will be active for this visit and any virtual visit you may have with one of our providers in the next 365 days. If you have a MyChart account, a copy of this consent can be sent to you electronically.  As this is a virtual visit, video technology does not allow for your provider to perform a traditional examination. This may limit your provider's ability to fully assess your condition. If your provider identifies any concerns that need to be evaluated in person or the need to arrange testing (such as labs, EKG, etc.), we will make arrangements to do so. Although advances in technology are sophisticated, we cannot ensure that it will always work on either your end or our end. If the connection with a video visit is poor, the visit may have to be switched to a telephone visit. With either a video or telephone visit, we are not always able to ensure that we have a secure connection.  By engaging in this virtual visit, you consent to the provision of healthcare and authorize for your insurance to be billed (if applicable) for the services provided during this visit. Depending on your insurance coverage, you may receive a charge related to this service.  I need to obtain your verbal consent now. Are you willing to proceed with your visit today? Whitney [name] has provided verbal consent on 02/20/2022 for a virtual visit (video or telephone). Mar Daring, PA-C  Date: 02/20/2022 12:09 PM  Virtual Visit via Video Note   I, Mar Daring, connected with  Whitney [name]  ([id number], 12/18/1960) on 02/20/22 at 12:00 PM EST by a video-enabled telemedicine application and verified that I am speaking with the correct person using two identifiers.  Location: Patient: Virtual Visit Location  Patient: Home Provider: Virtual Visit Location Provider: Home Office   I discussed the limitations of evaluation and management by telemedicine and the availability of in person appointments. The patient expressed understanding and agreed to proceed.    History of Present Illness: Whitney [name] is a 61 y.o. who identifies as a female who was assigned female at birth, and is being seen today for Covid 13.  HPI: URI  This is a new problem. Episode onset: Tested positive on at home test for Covid 19 today; symptoms started Thursday evening and worsened yesterday and today. The problem has been gradually worsening. There has been no fever (subjective fevers). The fever has been present for Less than 1 day. Associated symptoms include congestion, coughing, headaches, rhinorrhea (having to blow intermittently, post nasal drainage), sinus pain and a sore throat. Pertinent negatives include no diarrhea, ear pain, nausea, plugged ear sensation or vomiting. Associated symptoms comments: Hoarse voice, chills, fatigue. She has tried acetaminophen (dayquil) for the symptoms. The treatment provided no relief.     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   DDD (degenerative disc disease), lumbar 10/19/2019   Overweight (BMI 25.0-29.9) 09/15/2019   History of Barrett's esophagus 09/15/2019   Frequent headaches 10/14/2015   Fatigue 05/05/2015   Iron deficiency 12/30/2014   Type 2 diabetes mellitus with other specified complication ([place]) 07/37/1062   Microcytic anemia 10/15/2014   Health  maintenance examination 10/08/2014   Hyperlipidemia associated with type 2 diabetes mellitus ([place]) 10/09/2008   GERD (gastroesophageal reflux disease) 09/27/2006    Allergies:  Allergies  Allergen Reactions   Sertraline Hcl     REACTION: Headache/ vision changes   Medications:  Current  Outpatient Medications:    nirmatrelvir/ritonavir EUA (PAXLOVID) 20 x 150 MG & 10 x '100MG'$  TABS, Take 3 tablets by mouth 2 (two) times daily for 5 days. (Take nirmatrelvir 150 mg two tablets twice daily for 5 days and ritonavir 100 mg one tablet twice daily for 5 days) Patient GFR is 85, Disp: 30 tablet, Rfl: 0   ASPIRIN PO, Take 1 tablet by mouth daily., Disp: , Rfl:    atorvastatin (LIPITOR) 40 MG tablet, Take 40 mg by mouth daily., Disp: , Rfl:    Blood Glucose Monitoring Suppl (FREESTYLE LITE) DEVI, Use as directed to check blood sugars, Disp: 1 each, Rfl: 0   Cholecalciferol (VITAMIN D3) 50 MCG (2000 UT) capsule, Take 2,000 Units by mouth daily., Disp: , Rfl:    Coenzyme Q10 (CO Q10 PO), Take by mouth daily., Disp: , Rfl:    colchicine 0.6 MG tablet, Take 1 tablet (0.6 mg total) by mouth daily., Disp: , Rfl:    Continuous Blood Gluc Receiver (FREESTYLE LIBRE 2 READER) DEVI, Use as directed to check blood sugars. Dx E11.9, Disp: 1 each, Rfl: 0   Continuous Blood Gluc Sensor (FREESTYLE LIBRE 2 SENSOR) MISC, Apply to skin every 14 days for continuous blood glucose monitoring., Disp: 2 each, Rfl: 2   Cyanocobalamin (VITAMIN B-12) 1000 MCG SUBL, Place under the tongue daily. Liquid, Disp: , Rfl:    cyclobenzaprine (FLEXERIL) 5 MG tablet, TAKE 1 TABLET BY MOUTH AT BEDTIME AS NEEDED FOR MUSCLE SPASMS., Disp: 30 tablet, Rfl: 1   Dulaglutide (TRULICITY) 3 LK/4.4WN SOPN, Inject 3 mg as directed once a week., Disp: 6 mL, Rfl: 3   ezetimibe (ZETIA) 10 MG tablet, Take 10 mg by mouth daily., Disp: , Rfl:    fluticasone (FLONASE) 50 MCG/ACT nasal spray, PLACE 2 SPRAYS INTO BOTH NOSTRILS AS NEEDED FOR ALLERGIES., Disp: 48 mL, Rfl: 1   hydrOXYzine (ATARAX) 25 MG tablet, Take 0.5-1 tablets (12.5-25 mg total) by mouth 3 (three) times daily as needed for anxiety (sedation precautions)., Disp: 30 tablet, Rfl: 1   JARDIANCE 25 MG TABS tablet, Take 25 mg by mouth daily., Disp: , Rfl:    metFORMIN (GLUCOPHAGE-XR) 500  MG 24 hr tablet, Take 1 tablet (500 mg total) by mouth in the morning and at bedtime., Disp: 180 tablet, Rfl: 3   Omega-3 Fatty Acids (FISH OIL) 1200 MG CPDR, Take 1,200 mg by mouth daily., Disp: , Rfl:    omeprazole (PRILOSEC) 40 MG capsule, Take 1 capsule (40 mg total) by mouth daily., Disp: 90 capsule, Rfl: 3   triamcinolone cream (KENALOG) 0.1 %, APPLY 1 APPLICATION TOPICALLY 2 (TWO) TIMES DAILY., Disp: 30 g, Rfl: 1  Observations/Objective: Patient is well-developed, well-nourished in no acute distress.  Resting comfortably at home.  Head is normocephalic, atraumatic.  No labored breathing.  Speech is clear and coherent with logical content.  Patient is alert and oriented at baseline.    Assessment and Plan: 1. COVID-19 - nirmatrelvir/ritonavir EUA (PAXLOVID) 20 x 150 MG & 10 x '100MG'$  TABS; Take 3 tablets by mouth 2 (two) times daily for 5 days. (Take nirmatrelvir 150 mg two tablets twice daily for 5 days and ritonavir 100 mg one tablet twice daily for 5  days) Patient GFR is 85  Dispense: 30 tablet; Refill: 0 - MyChart COVID-19 home monitoring program; Future  - Continue OTC symptomatic management of choice - Will send OTC vitamins and supplement information through AVS - Paxlovid prescribed; hold Atorvastatin (Lipitor) and Ezetimibe (Zetia)  - Patient enrolled in MyChart symptom monitoring - Push fluids - Rest as needed - Discussed return precautions and when to seek in-person evaluation, sent via AVS as well   Follow Up Instructions: I discussed the assessment and treatment plan with the patient. The patient was provided an opportunity to ask questions and all were answered. The patient agreed with the plan and demonstrated an understanding of the instructions.  A copy of instructions were sent to the patient via MyChart unless otherwise noted below.    The patient was advised to call back or seek an in-person evaluation if the symptoms worsen or if the condition fails to improve  as anticipated.  Time:  I spent 10 minutes with the patient via telehealth technology discussing the above problems/concerns.    Mar Daring, PA-C

## 2022-03-09 ENCOUNTER — Encounter: Payer: Self-pay | Admitting: Family Medicine

## 2022-03-31 ENCOUNTER — Encounter: Payer: Self-pay | Admitting: Family Medicine

## 2022-03-31 DIAGNOSIS — E1169 Type 2 diabetes mellitus with other specified complication: Secondary | ICD-10-CM

## 2022-03-31 NOTE — Telephone Encounter (Signed)
Whitney [name] for me to send in change

## 2022-04-08 ENCOUNTER — Encounter: Payer: Self-pay | Admitting: Family Medicine

## 2022-04-08 MED ORDER — ACCU-CHEK GUIDE VI STRP: ORAL_STRIP | 12 refills | Status: DC

## 2022-04-08 MED ORDER — ACCU-CHEK SOFTCLIX LANCETS MISC: 0 refills | Status: DC

## 2022-04-08 NOTE — Telephone Encounter (Signed)
E-scribed rx for Accu-Chek Guide test strips and Accu-Chek Softclix lancets.

## 2022-04-08 NOTE — Telephone Encounter (Signed)
Pt request [facility]. Sending note to [place] pool.

## 2022-04-08 NOTE — Telephone Encounter (Signed)
I spoke with pt; pt said had covid 5-6 weeks ago; pt said now no energy, brain fog and feels anxious at times. No SI/HI. Pt wonders if should try Pristiq again; pt last seen 10/30/21 and was to return in 2-3 months and pt has not scheduled. Pt said she wonders if could be iron getting low also. Pt said recent FBS has been between 140-170; pt said if she drinks more water the BS does come down. Pt scheduled appt with Dr Darnell Level for 04/12/22 at 2 pm. Pt will come fasting to appt if needs labs done. Last labs done 07/2021. UC & ED precautions given and pt voiced understanding. Sending note to Dr Darnell Level and [facility] pool and will teams [place] CMA.

## 2022-04-08 NOTE — Addendum Note (Signed)
Addended by: Brenton Grills on: 06/09/2765 01:10 AM   Modules accepted: Orders

## 2022-04-09 NOTE — Telephone Encounter (Signed)
Noted thanks. Will evaluate at [facility].

## 2022-04-12 ENCOUNTER — Ambulatory Visit: Payer: 59 | Admitting: Family Medicine

## 2022-04-12 ENCOUNTER — Encounter: Payer: Self-pay | Admitting: Family Medicine

## 2022-04-12 VITALS — BP 126/82 | HR 89 | Temp 97.5°F | Ht 65.5 in | Wt 178.5 lb

## 2022-04-12 DIAGNOSIS — E611 Iron deficiency: Secondary | ICD-10-CM | POA: Diagnosis not present

## 2022-04-12 DIAGNOSIS — E1169 Type 2 diabetes mellitus with other specified complication: Secondary | ICD-10-CM | POA: Diagnosis not present

## 2022-04-12 DIAGNOSIS — F418 Other specified anxiety disorders: Secondary | ICD-10-CM | POA: Diagnosis not present

## 2022-04-12 DIAGNOSIS — R5383 Other fatigue: Secondary | ICD-10-CM

## 2022-04-12 DIAGNOSIS — U099 Post covid-19 condition, unspecified: Secondary | ICD-10-CM | POA: Diagnosis not present

## 2022-04-12 LAB — POCT URINALYSIS DIPSTICK
Bilirubin, UA: NEGATIVE
Blood, UA: NEGATIVE
Glucose, UA: POSITIVE — AB
Ketones, UA: NEGATIVE
Leukocytes, UA: NEGATIVE
Nitrite, UA: NEGATIVE
Protein, UA: NEGATIVE
Spec Grav, UA: 1.025 (ref 1.010–1.025)
Urobilinogen, UA: 0.2 E.U./dL
pH, UA: 5 (ref 5.0–8.0)

## 2022-04-12 LAB — POCT GLYCOSYLATED HEMOGLOBIN (HGB A1C): Hemoglobin A1C: 7.7 % — AB (ref 4.0–5.6)

## 2022-04-12 MED ORDER — DESVENLAFAXINE SUCCINATE ER 25 MG PO TB24: 25.0000 mg | ORAL_TABLET | Freq: Every day | ORAL | 1 refills | Status: DC

## 2022-04-12 NOTE — Progress Notes (Unsigned)
Patient ID: Whitney [name], female    DOB: [birth year], 62 y.o.   MRN: [id number]  This visit was conducted in person.  BP 126/82   Pulse 89   Temp (!) 97.5 F (36.4 C) (Temporal)   Ht 5' 5.5" (1.664 m)   Wt 178 lb 8 oz (81 kg)   LMP 06/17/2011   SpO2 97%   BMI 29.25 kg/m    CC: f/u mood Subjective:   HPI: Whitney [name] is a 62 y.o. female presenting on 04/12/2022 for Fatigue (C/o lack of energy, trouble focusing and brain fog. Wants to discuss Pristiq and hydroxyzine. )   See prior note for details. Last visit 10/2021 started on Pristiq '50mg'$  daily with hydroxyzine 12.5-'25mg'$  PRN. Pristiq quickly stopped after developing racing heart, drowsiness, trouble focusing.   Subsequently developed COVID late 01/2022, treated with Paxlovid.  Interested in retrial of Pristiq due to worsening fatigue, inattention, foggy feeling and trouble sleeping. No daytime napping.  Notes most of these symptoms are worse since COVID 01/2022.  Notes anxiety > depression.   H/o post-COVID viral pericarditis.   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   H/o IDA s/p iron infusion x5 (Venofer) 08/2021. Oral iron caused constipation. No PICA. No noted blood in stool, urine or vaginal bleeding.   Fasting cbg 188 this morning.  DM - does regularly check sugars 140-170 fasting. Compliant with antihyperglycemic regimen which includes: trulicity '3mg'$  weekly, jardiance '25mg'$  daily, metformin XR '500mg'$  twice daily. Denies low sugars or hypoglycemic symptoms. Denies paresthesias. Occ blurry vision. Last diabetic eye exam 06/2021. Glucometer brand: accuchek. Last foot exam: 2/023. DSME: completed 2016. Lab Results  Component Value Date   HGBA1C 7.7 (A) 04/12/2022   Diabetic Foot Exam - Simple   No data filed    Lab Results  Component Value Date   MICROALBUR <0.7 05/29/2019        Relevant past medical, surgical, family and social history reviewed and updated  as indicated. Interim medical history since our last visit reviewed. Allergies and medications reviewed and updated. Outpatient Medications Prior to Visit  Medication Sig Dispense Refill   Accu-Chek Softclix Lancets lancets Use as instructed to check blood sugar once daily 100 each 0   ASPIRIN PO Take 1 tablet by mouth daily.     atorvastatin (LIPITOR) 40 MG tablet Take 40 mg by mouth daily.     Cholecalciferol (VITAMIN D3) 50 MCG (2000 UT) capsule Take 2,000 Units by mouth daily.     Coenzyme Q10 (CO Q10 PO) Take by mouth daily.     colchicine 0.6 MG tablet Take 1 tablet (0.6 mg total) by mouth daily.     Cyanocobalamin (VITAMIN B-12) 1000 MCG SUBL Place under the tongue daily. Liquid     cyclobenzaprine (FLEXERIL) 5 MG tablet TAKE 1 TABLET BY MOUTH AT BEDTIME AS NEEDED FOR MUSCLE SPASMS. 30 tablet 1   Dulaglutide (TRULICITY) 3 NW/2.9FA SOPN Inject 3 mg as directed once a week. 6 mL 3   ezetimibe (ZETIA) 10 MG tablet Take 10 mg by mouth daily.     glucose blood (ACCU-CHEK GUIDE) test strip Use as instructed to check blood sugar once daily 100 each 12   JARDIANCE 25 MG TABS tablet Take 25 mg by mouth daily.     metFORMIN (GLUCOPHAGE-XR) 500 MG 24 hr tablet Take 1 tablet (500 mg total) by mouth in the morning and at bedtime. 180 tablet 3  Omega-3 Fatty Acids (FISH OIL) 1200 MG CPDR Take 1,200 mg by mouth daily.     omeprazole (PRILOSEC) 40 MG capsule Take 1 capsule (40 mg total) by mouth daily. 90 capsule 3   triamcinolone cream (KENALOG) 0.1 % APPLY 1 APPLICATION TOPICALLY 2 (TWO) TIMES DAILY. 30 g 1   fluticasone (FLONASE) 50 MCG/ACT nasal spray PLACE 2 SPRAYS INTO BOTH NOSTRILS AS NEEDED FOR ALLERGIES. 48 mL 1   hydrOXYzine (ATARAX) 25 MG tablet Take 0.5-1 tablets (12.5-25 mg total) by mouth 3 (three) times daily as needed for anxiety (sedation precautions). (Patient not taking: Reported on 04/12/2022) 30 tablet 1   Blood Glucose Monitoring Suppl (FREESTYLE LITE) DEVI Use as directed to check  blood sugars 1 each 0   Continuous Blood Gluc Receiver (FREESTYLE LIBRE 2 READER) DEVI Use as directed to check blood sugars. Dx E11.9 1 each 0   Continuous Blood Gluc Sensor (FREESTYLE LIBRE 2 SENSOR) MISC Apply to skin every 14 days for continuous blood glucose monitoring. 2 each 2   No facility-administered medications prior to visit.     Per HPI unless specifically indicated in ROS section below Review of Systems  Objective:  BP 126/82   Pulse 89   Temp (!) 97.5 F (36.4 C) (Temporal)   Ht 5' 5.5" (1.664 m)   Wt 178 lb 8 oz (81 kg)   LMP 06/17/2011   SpO2 97%   BMI 29.25 kg/m   Wt Readings from Last 3 Encounters:  04/12/22 178 lb 8 oz (81 kg)  10/30/21 174 lb (78.9 kg)  09/28/21 172 lb (78 kg)      Physical Exam Vitals and nursing note reviewed.  Constitutional:      Appearance: Normal appearance. She is not ill-appearing.  HENT:     Head: Normocephalic and atraumatic.     Mouth/Throat:     Mouth: Mucous membranes are moist.     Pharynx: Oropharynx is clear. No oropharyngeal exudate or posterior oropharyngeal erythema.  Eyes:     Extraocular Movements: Extraocular movements intact.     Conjunctiva/sclera: Conjunctivae normal.     Pupils: Pupils are equal, round, and reactive to light.     Comments: No conjunctival pallor  Cardiovascular:     Rate and Rhythm: Normal rate and regular rhythm.     Pulses: Normal pulses.     Heart sounds: Normal heart sounds. No murmur heard. Pulmonary:     Effort: Pulmonary effort is normal. No respiratory distress.     Breath sounds: Normal breath sounds. No wheezing, rhonchi or rales.  Musculoskeletal:     Cervical back: Normal range of motion and neck supple.     Right lower leg: No edema.  Skin:    General: Skin is warm and dry.     Findings: No rash.  Neurological:     Mental Status: She is alert.  Psychiatric:        Mood and Affect: Mood normal.        Behavior: Behavior normal.       Results for orders placed or  performed in visit on 11/10/46  Basic metabolic panel  Result Value Ref Range   Glucose, Bld 115 (H) 65 - 99 mg/dL   BUN 16 7 - 25 mg/dL   Creat 0.69 0.50 - 1.05 mg/dL   BUN/Creatinine Ratio SEE NOTE: 6 - 22 (calc)   Sodium 141 135 - 146 mmol/L   Potassium 4.4 3.5 - 5.3 mmol/L   Chloride 102 98 -  110 mmol/L   CO2 26 20 - 32 mmol/L   Calcium 10.2 8.6 - 10.4 mg/dL  CBC with Differential/Platelet  Result Value Ref Range   WBC 9.9 3.8 - 10.8 Thousand/uL   RBC 5.44 (H) 3.80 - 5.10 Million/uL   Hemoglobin 17.2 (H) 11.7 - 15.5 g/dL   HCT 49.8 (H) 35.0 - 45.0 %   MCV 91.5 80.0 - 100.0 fL   MCH 31.6 27.0 - 33.0 pg   MCHC 34.5 32.0 - 36.0 g/dL   RDW 12.9 11.0 - 15.0 %   Platelets 382 140 - 400 Thousand/uL   MPV 9.8 7.5 - 12.5 fL   Neutro Abs 6,257 1,500 - 7,800 cells/uL   Lymphs Abs 2,534 850 - 3,900 cells/uL   Absolute Monocytes 772 200 - 950 cells/uL   Eosinophils Absolute 257 15 - 500 cells/uL   Basophils Absolute 79 0 - 200 cells/uL   Neutrophils Relative % 63.2 %   Total Lymphocyte 25.6 %   Monocytes Relative 7.8 %   Eosinophils Relative 2.6 %   Basophils Relative 0.8 %  Ferritin  Result Value Ref Range   Ferritin 91 16 - 288 ng/mL  TSH  Result Value Ref Range   TSH 1.02 0.40 - 4.50 mIU/L  Vitamin B12  Result Value Ref Range   Vitamin B-12 1,073 200 - 1,100 pg/mL  VITAMIN D 25 Hydroxy (Vit-D Deficiency, Fractures)  Result Value Ref Range   Vit D, 25-Hydroxy 39 30 - 100 ng/mL  Iron, Total/Total Iron Binding Cap  Result Value Ref Range   Iron 112 45 - 160 mcg/dL   TIBC 425 250 - 450 mcg/dL (calc)   %SAT 26 16 - 45 % (calc)  Transferrin  Result Value Ref Range   Transferrin 336 188 - 341 mg/dL  POCT glycosylated hemoglobin (Hb A1C)  Result Value Ref Range   Hemoglobin A1C 7.7 (A) 4.0 - 5.6 %   HbA1c POC (<> result, manual entry)     HbA1c, POC (prediabetic range)     HbA1c, POC (controlled diabetic range)    POCT urinalysis dipstick  Result Value Ref Range    Color, UA yellow    Clarity, UA clear    Glucose, UA Positive (A) Negative   Bilirubin, UA negative    Ketones, UA negative    Spec Grav, UA 1.025 1.010 - 1.025   Blood, UA negative    pH, UA 5.0 5.0 - 8.0   Protein, UA Negative Negative   Urobilinogen, UA 0.2 0.2 or 1.0 E.U./dL   Nitrite, UA negative    Leukocytes, UA Negative Negative   Appearance     Odor        04/12/2022    2:08 PM 10/30/2021    2:41 PM 09/28/2021    9:50 AM 08/26/2021    8:09 AM 12/24/2019    8:42 AM  Depression screen PHQ 2/9  Decreased Interest 2 0 0 0 0  Down, Depressed, Hopeless 0 1 0 0 0  PHQ - 2 Score 2 1 0 0 0  Altered sleeping 3 0   1  Tired, decreased energy '3 2   2  '$ Change in appetite 0 0   3  Feeling bad or failure about yourself  0 0   0  Trouble concentrating 3 0   0  Moving slowly or fidgety/restless 0 0   0  Suicidal thoughts 0 0   0  PHQ-9 Score '11 3   6  '$ Difficult doing  work/chores Very difficult Somewhat difficult   Somewhat difficult       04/12/2022    2:09 PM 10/30/2021    2:41 PM 09/14/2019   10:20 AM 07/02/2019    4:10 PM  GAD 7 : Generalized Anxiety Score  Nervous, Anxious, on Edge 2 2 0 3  Control/stop worrying 0 0 0 3  Worry too much - different things 0 0 0 3  Trouble relaxing 1 1 0 1  Restless 0 0 0 0  Easily annoyed or irritable 0 0 0 2  Afraid - awful might happen 0 1 0 0  Total GAD 7 Score 3 4 0 12  Anxiety Difficulty Somewhat difficult Somewhat difficult Not difficult at all Very difficult   Assessment & Plan:   Problem List Items Addressed This Visit     Type 2 diabetes mellitus with other specified complication ([place]) - Primary    Update A1c on metformin XR '500mg'$  BID, jardiance '25mg'$  daily, and trulicity '3mg'$  weekly. Consider increasing metformin.       Relevant Orders   POCT glycosylated hemoglobin (Hb A1C) (Completed)   POCT urinalysis dipstick (Completed)   Iron deficiency    Update levels in h/o iron deficiency. This could account for fatigue.  She is  currently off iron replacement. Colonoscopy 03/2020 reassuring. Check UA today to r/o occult hematuria. Denies vaginal bleeding.       Relevant Orders   CBC with Differential/Platelet (Completed)   Ferritin (Completed)   Iron, Total/Total Iron Binding Cap (Completed)   Transferrin (Completed)   Fatigue    Ongoing symptoms. Check labs to evaluate for reversible causes of fatigue. See below in h/o IDA.       Relevant Orders   Basic metabolic panel (Completed)   CBC with Differential/Platelet (Completed)   Ferritin (Completed)   TSH (Completed)   Vitamin B12 (Completed)   VITAMIN D 25 Hydroxy (Vit-D Deficiency, Fractures) (Completed)   Iron, Total/Total Iron Binding Cap (Completed)   Transferrin (Completed)   Post-COVID-19 syndrome    Had long hauler COVID symptoms back in 2022, again had COVID 01/2022, anticipate post-COVID contributes to current symptoms. Check labs to r/o vitamin deficiencies.       Situational anxiety    See above. Stress over husband's situation is improved. However symptoms of fatigue, inattention, mental fog, trouble sleeping (sleep maintenance insomnia) persist. Trial lower dose Pristiq '25mg'$  daily.       Relevant Medications   desvenlafaxine (PRISTIQ) 25 MG 24 hr tablet     Meds ordered this encounter  Medications   desvenlafaxine (PRISTIQ) 25 MG 24 hr tablet    Sig: Take 1 tablet (25 mg total) by mouth daily.    Dispense:  30 tablet    Refill:  1    Orders Placed This Encounter  Procedures   Basic metabolic panel   CBC with Differential/Platelet   Ferritin   TSH   Vitamin B12   VITAMIN D 25 Hydroxy (Vit-D Deficiency, Fractures)   Iron, Total/Total Iron Binding Cap   Transferrin   POCT glycosylated hemoglobin (Hb A1C)   POCT urinalysis dipstick    Patient Instructions  Labs today, urine test today.  A1c today  We will be in touch with results. May need to repeat iron infusion and GI referral to see why low iron levels persist.  Ok to  start Pristiq '25mg'$  daily - but hold from starting until labs return.   Follow up plan: Return in about 5 months (around 09/11/2022), or  if symptoms worsen or fail to improve, for annual exam, prior fasting for blood work.  Ria Bush, MD

## 2022-04-12 NOTE — Patient Instructions (Addendum)
Labs today, urine test today.  A1c today  We will be in touch with results. May need to repeat iron infusion and GI referral to see why low iron levels persist.  Ok to start Pristiq '25mg'$  daily - but hold from starting until labs return.

## 2022-04-13 ENCOUNTER — Other Ambulatory Visit: Payer: Self-pay | Admitting: Family Medicine

## 2022-04-13 ENCOUNTER — Encounter: Payer: Self-pay | Admitting: Family Medicine

## 2022-04-13 DIAGNOSIS — D751 Secondary polycythemia: Secondary | ICD-10-CM | POA: Insufficient documentation

## 2022-04-13 DIAGNOSIS — F331 Major depressive disorder, recurrent, moderate: Secondary | ICD-10-CM | POA: Insufficient documentation

## 2022-04-13 MED ORDER — METFORMIN HCL ER 500 MG PO TB24: ORAL_TABLET | ORAL | 2 refills | Status: DC

## 2022-04-13 NOTE — Assessment & Plan Note (Addendum)
See above. Stress over husband's situation is improved. However symptoms of fatigue, inattention, mental fog, trouble sleeping (sleep maintenance insomnia) persist. Trial lower dose Pristiq '25mg'$  daily.

## 2022-04-13 NOTE — Assessment & Plan Note (Signed)
Had [place] symptoms back in 2022, again had COVID 01/2022, anticipate post-COVID contributes to current symptoms. Check labs to r/o vitamin deficiencies.

## 2022-04-13 NOTE — Telephone Encounter (Signed)
Patients chart updated.

## 2022-04-13 NOTE — Assessment & Plan Note (Addendum)
Update levels in h/o iron deficiency. This could account for fatigue.  She is currently off iron replacement. Colonoscopy 03/2020 reassuring. Check UA today to r/o occult hematuria. Denies vaginal bleeding.

## 2022-04-13 NOTE — Assessment & Plan Note (Addendum)
Update A1c on metformin XR '500mg'$  BID, jardiance '25mg'$  daily, and trulicity '3mg'$  weekly. Consider increasing metformin.

## 2022-04-13 NOTE — Assessment & Plan Note (Signed)
Ongoing symptoms. Check labs to evaluate for reversible causes of fatigue. See below in h/o IDA.

## 2022-04-14 LAB — IRON, TOTAL/TOTAL IRON BINDING CAP
%SAT: 26 % (calc) (ref 16–45)
Iron: 112 ug/dL (ref 45–160)
TIBC: 425 mcg/dL (calc) (ref 250–450)

## 2022-04-14 LAB — CBC WITH DIFFERENTIAL/PLATELET
Absolute Monocytes: 772 cells/uL (ref 200–950)
Basophils Absolute: 79 cells/uL (ref 0–200)
Basophils Relative: 0.8 %
Eosinophils Absolute: 257 cells/uL (ref 15–500)
Eosinophils Relative: 2.6 %
HCT: 49.8 % — ABNORMAL HIGH (ref 35.0–45.0)
Hemoglobin: 17.2 g/dL — ABNORMAL HIGH (ref 11.7–15.5)
Lymphs Abs: 2534 cells/uL (ref 850–3900)
MCH: 31.6 pg (ref 27.0–33.0)
MCHC: 34.5 g/dL (ref 32.0–36.0)
MCV: 91.5 fL (ref 80.0–100.0)
MPV: 9.8 fL (ref 7.5–12.5)
Monocytes Relative: 7.8 %
Neutro Abs: 6257 cells/uL (ref 1500–7800)
Neutrophils Relative %: 63.2 %
Platelets: 382 10*3/uL (ref 140–400)
RBC: 5.44 10*6/uL — ABNORMAL HIGH (ref 3.80–5.10)
RDW: 12.9 % (ref 11.0–15.0)
Total Lymphocyte: 25.6 %
WBC: 9.9 10*3/uL (ref 3.8–10.8)

## 2022-04-14 LAB — BASIC METABOLIC PANEL
BUN: 16 mg/dL (ref 7–25)
CO2: 26 mmol/L (ref 20–32)
Calcium: 10.2 mg/dL (ref 8.6–10.4)
Chloride: 102 mmol/L (ref 98–110)
Creat: 0.69 mg/dL (ref 0.50–1.05)
Glucose, Bld: 115 mg/dL — ABNORMAL HIGH (ref 65–99)
Potassium: 4.4 mmol/L (ref 3.5–5.3)
Sodium: 141 mmol/L (ref 135–146)

## 2022-04-14 LAB — TRANSFERRIN: Transferrin: 336 mg/dL (ref 188–341)

## 2022-04-14 LAB — TSH: TSH: 1.02 mIU/L (ref 0.40–4.50)

## 2022-04-14 LAB — VITAMIN D 25 HYDROXY (VIT D DEFICIENCY, FRACTURES): Vit D, 25-Hydroxy: 39 ng/mL (ref 30–100)

## 2022-04-14 LAB — VITAMIN B12: Vitamin B-12: 1073 pg/mL (ref 200–1100)

## 2022-04-14 LAB — PATHOLOGIST SMEAR REVIEW

## 2022-04-14 LAB — FERRITIN: Ferritin: 91 ng/mL (ref 16–288)

## 2022-05-04 ENCOUNTER — Encounter: Payer: Self-pay | Admitting: Family Medicine

## 2022-05-04 MED ORDER — DESVENLAFAXINE SUCCINATE ER 25 MG PO TB24: 25.0000 mg | ORAL_TABLET | Freq: Every day | ORAL | 1 refills | Status: DC

## 2022-05-06 ENCOUNTER — Other Ambulatory Visit: Payer: Self-pay | Admitting: Family Medicine

## 2022-05-07 NOTE — Telephone Encounter (Signed)
Refill request flexeril Last refill 11/20/21 #30/1 Last office visit 04/12/22

## 2022-07-04 ENCOUNTER — Other Ambulatory Visit: Payer: Self-pay | Admitting: Family Medicine

## 2022-07-05 NOTE — Telephone Encounter (Signed)
Refill request Cyclobenzaprine Last refill 05/07/22  #30/1 Last office visit 04/12/22

## 2022-08-18 ENCOUNTER — Other Ambulatory Visit: Payer: Self-pay | Admitting: Family Medicine

## 2022-08-18 DIAGNOSIS — E1169 Type 2 diabetes mellitus with other specified complication: Secondary | ICD-10-CM

## 2022-08-26 ENCOUNTER — Ambulatory Visit: Payer: 59 | Admitting: Family Medicine

## 2022-08-26 ENCOUNTER — Encounter: Payer: Self-pay | Admitting: Family Medicine

## 2022-08-26 VITALS — BP 116/64 | HR 102 | Temp 97.3°F | Ht 65.5 in | Wt 181.0 lb

## 2022-08-26 DIAGNOSIS — R300 Dysuria: Secondary | ICD-10-CM | POA: Diagnosis not present

## 2022-08-26 LAB — POC URINALSYSI DIPSTICK (AUTOMATED)
Bilirubin, UA: NEGATIVE
Glucose, UA: POSITIVE — AB
Ketones, UA: NEGATIVE
Leukocytes, UA: NEGATIVE
Nitrite, UA: NEGATIVE
Protein, UA: NEGATIVE
Spec Grav, UA: 1.01 (ref 1.010–1.025)
Urobilinogen, UA: 0.2 E.U./dL
pH, UA: 6 (ref 5.0–8.0)

## 2022-08-26 MED ORDER — SULFAMETHOXAZOLE-TRIMETHOPRIM 800-160 MG PO TABS: 1.0000 | ORAL_TABLET | Freq: Two times a day (BID) | ORAL | 0 refills | Status: DC

## 2022-08-26 NOTE — Patient Instructions (Addendum)
We will request latest note from Dr Sharyn Lull.  Urine overall looking ok.  Increase water intake. Limit bladder irritants like caffeine, dark soda, spicy foods.  Take bactrim twice daily for 3 days. Urine culture sent.  We will be in touch with culture results.

## 2022-08-26 NOTE — Telephone Encounter (Signed)
Spoke with pt scheduling OV today at 4:00 with Dr. Copland.  

## 2022-08-26 NOTE — Progress Notes (Addendum)
Ph: [phone] Fax: [phone]   Patient ID: [name], female    DOB: 09-May-1960, 62 y.o.   MRN: [id number]  This visit was conducted in person.  BP 116/64   Pulse (!) 102   Temp (!) 97.3 F (36.3 C) (Temporal)   Ht 5' 5.5" (1.664 m)   Wt 181 lb (82.1 kg)   LMP 06/17/2011   SpO2 94%   BMI 29.66 kg/m    CC: UTI symptoms Subjective:   HPI: KONNOR VANBERGEN is a 62 y.o. female presenting on 08/26/2022 for Dysuria (C/o burning when urinating and urinary urgency. Sxs started about 1.5 wks ago. Tried AZO- helpful. )   1+ wk h/o dysuria, urgency, frequency. Treated at home with AZO and cranberry tablets which was helpful.  She is on Jardiance 25mg  daily through cardiology Sharyn Lull) I don't have access to those records so requested latest OV today.   No hematuria, abd pain or flank pain, fevers/chills, nausea/vomiting.  No urethral discharge or vaginal/vulvar rash or cellulitis changes to skin.   No recent UTI.  No recent antibiotics.   Lab Results  Component Value Date   HGBA1C 7.7 (A) 04/12/2022        Relevant past medical, surgical, family and social history reviewed and updated as indicated. Interim medical history since our last visit reviewed. Allergies and medications reviewed and updated. Outpatient Medications Prior to Visit  Medication Sig Dispense Refill   Accu-Chek Softclix Lancets lancets USE AS DIRECTED TO CHECK BLOOD SUGAR ONCE DAILY 100 each 3   APPLE CIDER VINEGAR PO Take by mouth. Gummy, take one by mouth daily     ascorbic acid (VITAMIN C) 500 MG tablet Take 500 mg by mouth 2 (two) times daily.     ASPIRIN PO Take 1 tablet by mouth daily.     atorvastatin (LIPITOR) 40 MG tablet Take 40 mg by mouth daily.     Biotin 95284 MCG TBDP Take by mouth. Take one by mouth daily     Cholecalciferol (VITAMIN D3) 50 MCG (2000 UT) capsule Take 2,000 Units by mouth daily.     Coenzyme Q10 (CO Q10 PO) Take by mouth daily.     colchicine 0.6 MG tablet Take 1  tablet (0.6 mg total) by mouth daily.     Cyanocobalamin (VITAMIN B-12) 1000 MCG SUBL Place under the tongue daily. Liquid     cyclobenzaprine (FLEXERIL) 5 MG tablet TAKE 1 TABLET BY MOUTH AT BEDTIME AS NEEDED FOR MUSCLE SPASMS. 30 tablet 1   desvenlafaxine (PRISTIQ) 25 MG 24 hr tablet Take 1 tablet (25 mg total) by mouth daily. 90 tablet 1   Dulaglutide (TRULICITY) 3 MG/0.5ML SOPN Inject 3 mg as directed once a week. 6 mL 3   ezetimibe (ZETIA) 10 MG tablet Take 10 mg by mouth daily.     glucose blood (ACCU-CHEK GUIDE) test strip Use as instructed to check blood sugar once daily 100 each 12   hydrOXYzine (ATARAX) 25 MG tablet Take 0.5-1 tablets (12.5-25 mg total) by mouth 3 (three) times daily as needed for anxiety (sedation precautions). 30 tablet 1   JARDIANCE 25 MG TABS tablet Take 25 mg by mouth daily.     magnesium oxide (MAG-OX) 400 (240 Mg) MG tablet Take 400 mg by mouth daily.     metFORMIN (GLUCOPHAGE-XR) 500 MG 24 hr tablet Take 1 tablet (500 mg total) by mouth daily with breakfast AND 2 tablets (1,000 mg total) daily with supper. 270 tablet 2  Multiple Vitamins-Minerals (EQ MULTIVITAMINS ADULT GUMMY PO) Take by mouth. Take two by month daily     Omega-3 Fatty Acids (FISH OIL) 1200 MG CPDR Take 1,200 mg by mouth daily.     omeprazole (PRILOSEC) 40 MG capsule Take 1 capsule (40 mg total) by mouth daily. 90 capsule 3   triamcinolone cream (KENALOG) 0.1 % APPLY 1 APPLICATION TOPICALLY 2 (TWO) TIMES DAILY. 30 g 1   zinc gluconate 50 MG tablet Take 50 mg by mouth daily.     fluticasone (FLONASE) 50 MCG/ACT nasal spray PLACE 2 SPRAYS INTO BOTH NOSTRILS AS NEEDED FOR ALLERGIES. 48 mL 1   metoprolol succinate (TOPROL-XL) 25 MG 24 hr tablet Take 25 mg by mouth daily.     No facility-administered medications prior to visit.     Per HPI unless specifically indicated in ROS section below Review of Systems  Objective:  BP 116/64   Pulse (!) 102   Temp (!) 97.3 F (36.3 C) (Temporal)   Ht  5' 5.5" (1.664 m)   Wt 181 lb (82.1 kg)   LMP 06/17/2011   SpO2 94%   BMI 29.66 kg/m   Wt Readings from Last 3 Encounters:  08/26/22 181 lb (82.1 kg)  04/12/22 178 lb 8 oz (81 kg)  10/30/21 174 lb (78.9 kg)      Physical Exam Vitals and nursing note reviewed.  Constitutional:      Appearance: Normal appearance. She is not ill-appearing.  Cardiovascular:     Rate and Rhythm: Normal rate and regular rhythm.     Pulses: Normal pulses.     Heart sounds: Normal heart sounds. No murmur heard. Pulmonary:     Effort: Pulmonary effort is normal. No respiratory distress.     Breath sounds: Normal breath sounds. No wheezing or rhonchi.  Abdominal:     General: Bowel sounds are normal. There is no distension.     Palpations: Abdomen is soft. There is no mass.     Tenderness: There is no abdominal tenderness. There is no right CVA tenderness, left CVA tenderness, guarding or rebound.     Hernia: No hernia is present.  Musculoskeletal:     Right lower leg: No edema.     Left lower leg: No edema.  Skin:    General: Skin is warm and dry.     Capillary Refill: Capillary refill takes less than 2 seconds.  Neurological:     Mental Status: She is alert.  Psychiatric:        Mood and Affect: Mood normal.        Behavior: Behavior normal.       Results for orders placed or performed in visit on 08/26/22  POCT Urinalysis Dipstick (Automated)  Result Value Ref Range   Color, UA yellow    Clarity, UA clear    Glucose, UA Positive (A) Negative   Bilirubin, UA negative    Ketones, UA negative    Spec Grav, UA 1.010 1.010 - 1.025   Blood, UA +/-    pH, UA 6.0 5.0 - 8.0   Protein, UA Negative Negative   Urobilinogen, UA 0.2 0.2 or 1.0 E.U./dL   Nitrite, UA negative    Leukocytes, UA Negative Negative   Lab Results  Component Value Date   CREATININE 0.69 04/12/2022   BUN 16 04/12/2022   NA 141 04/12/2022   K 4.4 04/12/2022   CL 102 04/12/2022   CO2 26 04/12/2022    Assessment &  Plan:  Problem List Items Addressed This Visit     Dysuria - Primary    1+ wk h/o typical UTI symptoms. UA largely normal. UCx sent.  Given typical symptoms Rx bactrim 3d course. Discussed if UCx negative, would stop antibiotic. In interim, avoid bladder irritants.  No h/o interstitial cystitis, no recent UTI or antibiotic use. Jardiance could cause frequency however wouldn't expect dysuria.  No symptoms of yeast infection.       Relevant Orders   POCT Urinalysis Dipstick (Automated) (Completed)   Urine Culture     Meds ordered this encounter  Medications   sulfamethoxazole-trimethoprim (BACTRIM DS) 800-160 MG tablet    Sig: Take 1 tablet by mouth 2 (two) times daily.    Dispense:  6 tablet    Refill:  0    Orders Placed This Encounter  Procedures   Urine Culture   POCT Urinalysis Dipstick (Automated)    Patient Instructions  We will request latest note from Dr Sharyn Lull.  Urine overall looking ok.  Increase water intake. Limit bladder irritants like caffeine, dark soda, spicy foods.  Take bactrim twice daily for 3 days. Urine culture sent.  We will be in touch with culture results.   Follow up plan: Return if symptoms worsen or fail to improve.  Eustaquio Boyden, MD

## 2022-08-27 DIAGNOSIS — B9689 Other specified bacterial agents as the cause of diseases classified elsewhere: Secondary | ICD-10-CM | POA: Insufficient documentation

## 2022-08-27 DIAGNOSIS — N390 Urinary tract infection, site not specified: Secondary | ICD-10-CM | POA: Insufficient documentation

## 2022-08-27 DIAGNOSIS — R300 Dysuria: Secondary | ICD-10-CM | POA: Insufficient documentation

## 2022-08-27 NOTE — Assessment & Plan Note (Addendum)
1+ wk h/o typical UTI symptoms. UA largely normal. UCx sent.  Given typical symptoms Rx bactrim 3d course. Discussed if UCx negative, would stop antibiotic. In interim, avoid bladder irritants.  No h/o interstitial cystitis, no recent UTI or antibiotic use. Jardiance could cause frequency however wouldn't expect dysuria.  No symptoms of yeast infection.

## 2022-08-28 LAB — URINE CULTURE
MICRO NUMBER:: 15020337
SPECIMEN QUALITY:: ADEQUATE

## 2022-08-29 ENCOUNTER — Encounter: Payer: Self-pay | Admitting: Family Medicine

## 2022-08-29 ENCOUNTER — Other Ambulatory Visit: Payer: Self-pay | Admitting: Family Medicine

## 2022-08-29 MED ORDER — AMOXICILLIN-POT CLAVULANATE 875-125 MG PO TABS: 1.0000 | ORAL_TABLET | Freq: Two times a day (BID) | ORAL | 0 refills | Status: AC

## 2022-08-30 ENCOUNTER — Other Ambulatory Visit: Payer: Self-pay | Admitting: Family Medicine

## 2022-08-30 NOTE — Telephone Encounter (Signed)
Last filled 08-04-22 #30 Last OV Acute 08-26-22 No Future OV [facility]

## 2022-10-07 ENCOUNTER — Other Ambulatory Visit: Payer: Self-pay | Admitting: Family Medicine

## 2022-10-08 NOTE — Telephone Encounter (Signed)
Patient has been scheduled

## 2022-10-08 NOTE — Telephone Encounter (Signed)
Pt needs 6 month diabetes follow-up with Dr Sharen Hones. Please schedule. Thanks.

## 2022-10-08 NOTE — Telephone Encounter (Signed)
Noted  

## 2022-10-20 ENCOUNTER — Encounter: Payer: Self-pay | Admitting: Family Medicine

## 2022-10-20 ENCOUNTER — Ambulatory Visit: Payer: 59 | Admitting: Family Medicine

## 2022-10-20 VITALS — BP 128/78 | HR 65 | Temp 97.2°F | Ht 65.5 in | Wt 172.5 lb

## 2022-10-20 DIAGNOSIS — Z6828 Body mass index (BMI) 28.0-28.9, adult: Secondary | ICD-10-CM

## 2022-10-20 DIAGNOSIS — E1169 Type 2 diabetes mellitus with other specified complication: Secondary | ICD-10-CM | POA: Diagnosis not present

## 2022-10-20 DIAGNOSIS — E663 Overweight: Secondary | ICD-10-CM | POA: Diagnosis not present

## 2022-10-20 DIAGNOSIS — Z7985 Long-term (current) use of injectable non-insulin antidiabetic drugs: Secondary | ICD-10-CM | POA: Diagnosis not present

## 2022-10-20 LAB — POCT GLYCOSYLATED HEMOGLOBIN (HGB A1C): Hemoglobin A1C: 7.6 % — AB (ref 4.0–5.6)

## 2022-10-20 NOTE — Assessment & Plan Note (Addendum)
Chronic, overall stable period with A1c 7.6%.  She has been off trulicity for the past 3 weeks due to shortage/affordability.  I asked her to call insurance/pharmacy to inquire if Ozempic is better covered, more affordable. She will let me know what she finds out.

## 2022-10-20 NOTE — Assessment & Plan Note (Signed)
7 lb weight loss since starting [employer] meal replacement program. Discussed this.

## 2022-10-20 NOTE — Progress Notes (Signed)
Ph: [phone] Fax: [phone]   Patient ID: Whitney [name], female    DOB: 12-20-1960, 62 y.o.   MRN: [id number]  This visit was conducted in person.  BP 128/78   Pulse 65   Temp (!) 97.2 F (36.2 C) (Temporal)   Ht 5' 5.5" (1.664 m)   Wt 172 lb 8 oz (78.2 kg)   LMP 06/17/2011   SpO2 96%   BMI 28.27 kg/m    CC: 6 mo DM f/u visit  Subjective:   HPI: Whitney [name] is a 62 y.o. female presenting on 10/20/2022 for Medical Management of Chronic Issues (Here for 6 mo DM f/u.)   She's started using [employer] meal replacement program - 5:1. Started this 08/2022.  S/p hiatal hernia surgery.   Notes diabetic neuropathy has improved with physical therapy course.   DM - does regularly check sugars: 167 fasting this morning. Compliant with antihyperglycemic regimen which includes: metformin XR 500/1000mg  daily, jardiance 25mg  daily, trulicity 3mg  weekly. Having some trouble getting Trulicity - ran out 3 wks ago. Denies low sugars or hypoglycemic symptoms. Denies paresthesias, blurry vision. Last diabetic eye exam DUE. Glucometer brand: accucheck. Last foot exam: DUE. DSME: 2016. Lab Results  Component Value Date   HGBA1C 7.6 (A) 10/20/2022   Diabetic Foot Exam - Simple   Simple Foot Form Diabetic Foot exam was performed with the following findings: Yes 10/20/2022  9:55 AM  Visual Inspection No deformities, no ulcerations, no other skin breakdown bilaterally: Yes Sensation Testing Intact to touch and monofilament testing bilaterally: Yes Pulse Check Posterior Tibialis and Dorsalis pulse intact bilaterally: Yes Comments No claudication  Neuropathy to feet    Lab Results  Component Value Date   MICROALBUR <0.7 05/29/2019         Relevant past medical, surgical, family and social history reviewed and updated as indicated. Interim medical history since our last visit reviewed. Allergies and medications reviewed and updated. Outpatient Medications Prior to Visit  Medication  Sig Dispense Refill   Accu-Chek Softclix Lancets lancets USE AS DIRECTED TO CHECK BLOOD SUGAR ONCE DAILY 100 each 3   APPLE CIDER VINEGAR PO Take by mouth. Gummy, take one by mouth daily     ascorbic acid (VITAMIN C) 500 MG tablet Take 500 mg by mouth 2 (two) times daily.     ASPIRIN PO Take 1 tablet by mouth daily.     atorvastatin (LIPITOR) 40 MG tablet Take 40 mg by mouth daily.     Biotin 84696 MCG TBDP Take by mouth. Take one by mouth daily     Cholecalciferol (VITAMIN D3) 50 MCG (2000 UT) capsule Take 2,000 Units by mouth daily.     Coenzyme Q10 (CO Q10 PO) Take by mouth daily.     colchicine 0.6 MG tablet Take 1 tablet (0.6 mg total) by mouth daily.     Cyanocobalamin (VITAMIN B-12) 1000 MCG SUBL Place under the tongue daily. Liquid     cyclobenzaprine (FLEXERIL) 5 MG tablet TAKE 1 TABLET BY MOUTH AT BEDTIME AS NEEDED FOR MUSCLE SPASMS. 30 tablet 1   desvenlafaxine (PRISTIQ) 25 MG 24 hr tablet Take 1 tablet (25 mg total) by mouth daily. 90 tablet 1   ezetimibe (ZETIA) 10 MG tablet Take 10 mg by mouth daily.     glucose blood (ACCU-CHEK GUIDE) test strip Use as instructed to check blood sugar once daily 100 each 12   hydrOXYzine (ATARAX) 25 MG tablet Take 0.5-1 tablets (12.5-25 mg total) by mouth  3 (three) times daily as needed for anxiety (sedation precautions). 30 tablet 1   JARDIANCE 25 MG TABS tablet Take 25 mg by mouth daily.     magnesium oxide (MAG-OX) 400 (240 Mg) MG tablet Take 400 mg by mouth daily.     metFORMIN (GLUCOPHAGE-XR) 500 MG 24 hr tablet Take 1 tablet (500 mg total) by mouth daily with breakfast AND 2 tablets (1,000 mg total) daily with supper. 270 tablet 2   metoprolol succinate (TOPROL-XL) 25 MG 24 hr tablet Take 25 mg by mouth daily.     Multiple Vitamins-Minerals (EQ MULTIVITAMINS ADULT GUMMY PO) Take by mouth. Take two by month daily     Omega-3 Fatty Acids (FISH OIL) 1200 MG CPDR Take 1,200 mg by mouth daily.     omeprazole (PRILOSEC) 40 MG capsule Take 1  capsule (40 mg total) by mouth daily. 90 capsule 3   triamcinolone cream (KENALOG) 0.1 % APPLY 1 APPLICATION TOPICALLY 2 (TWO) TIMES DAILY. 30 g 1   zinc gluconate 50 MG tablet Take 50 mg by mouth daily.     sulfamethoxazole-trimethoprim (BACTRIM DS) 800-160 MG tablet Take 1 tablet by mouth 2 (two) times daily. 6 tablet 0   Dulaglutide (TRULICITY) 3 MG/0.5ML SOPN INJECT 3 MG AS DIRECTED ONCE A WEEK. (Patient not taking: Reported on 10/20/2022) 2 mL 1   fluticasone (FLONASE) 50 MCG/ACT nasal spray PLACE 2 SPRAYS INTO BOTH NOSTRILS AS NEEDED FOR ALLERGIES. 48 mL 1   No facility-administered medications prior to visit.     Per HPI unless specifically indicated in ROS section below Review of Systems  Objective:  BP 128/78   Pulse 65   Temp (!) 97.2 F (36.2 C) (Temporal)   Ht 5' 5.5" (1.664 m)   Wt 172 lb 8 oz (78.2 kg)   LMP 06/17/2011   SpO2 96%   BMI 28.27 kg/m   Wt Readings from Last 3 Encounters:  10/20/22 172 lb 8 oz (78.2 kg)  08/26/22 181 lb (82.1 kg)  04/12/22 178 lb 8 oz (81 kg)      Physical Exam Vitals and nursing note reviewed.  Constitutional:      Appearance: Normal appearance. She is not ill-appearing.  Eyes:     Extraocular Movements: Extraocular movements intact.     Conjunctiva/sclera: Conjunctivae normal.     Pupils: Pupils are equal, round, and reactive to light.  Cardiovascular:     Rate and Rhythm: Normal rate and regular rhythm.     Pulses: Normal pulses.     Heart sounds: Normal heart sounds. No murmur heard. Pulmonary:     Effort: Pulmonary effort is normal. No respiratory distress.     Breath sounds: Normal breath sounds. No wheezing, rhonchi or rales.  Musculoskeletal:     Right lower leg: No edema.     Left lower leg: No edema.  Skin:    General: Skin is warm and dry.     Findings: No rash.  Neurological:     Mental Status: She is alert.  Psychiatric:        Mood and Affect: Mood normal.        Behavior: Behavior normal.       Results  for orders placed or performed in visit on 10/20/22  POCT glycosylated hemoglobin (Hb A1C)  Result Value Ref Range   Hemoglobin A1C 7.6 (A) 4.0 - 5.6 %   HbA1c POC (<> result, manual entry)     HbA1c, POC (prediabetic range)  HbA1c, POC (controlled diabetic range)      Assessment & Plan:   Problem List Items Addressed This Visit     Type 2 diabetes mellitus with other specified complication (HCC) - Primary    Chronic, overall stable period with A1c 7.6%.  She has been off trulicity for the past 3 weeks due to shortage/affordability.  I asked her to call insurance/pharmacy to inquire if Ozempic is better covered, more affordable. She will let me know what she finds out.       Relevant Orders   POCT glycosylated hemoglobin (Hb A1C) (Completed)   Overweight (BMI 25.0-29.9)    7 lb weight loss since starting [employer] meal replacement program. Discussed this.         No orders of the defined types were placed in this encounter.   Orders Placed This Encounter  Procedures   POCT glycosylated hemoglobin (Hb A1C)    Patient Instructions  Check with insurance/pharmacy on what changed regarding Trulicity coverage, check if Ozempic is cheaper/more affordable. Let me know what you find out.  Continue other medicines.  Good to see you today Return as needed or in 4 months for physical.   Follow up plan: Return in about 4 months (around 02/20/2023), or if symptoms worsen or fail to improve, for annual exam, prior fasting for blood work.  Eustaquio Boyden, MD

## 2022-10-20 NOTE — Patient Instructions (Addendum)
Check with insurance/pharmacy on what changed regarding Trulicity coverage, check if Ozempic is cheaper/more affordable. Let me know what you find out.  Continue other medicines.  Good to see you today Return as needed or in 4 months for physical.

## 2022-10-21 MED ORDER — TRULICITY 3 MG/0.5ML ~~LOC~~ SOAJ: 3.0000 mg | SUBCUTANEOUS | 2 refills | Status: DC

## 2022-10-27 ENCOUNTER — Other Ambulatory Visit: Payer: Self-pay | Admitting: Family Medicine

## 2022-10-27 DIAGNOSIS — M5136 Other intervertebral disc degeneration, lumbar region: Secondary | ICD-10-CM

## 2022-10-27 NOTE — Telephone Encounter (Signed)
Flexeril Last filled:  09/30/22, #30 Last OV:  10/20/22, 6 mo DM f/u Next OV:  none

## 2022-10-29 ENCOUNTER — Other Ambulatory Visit: Payer: Self-pay | Admitting: Family Medicine

## 2022-10-29 ENCOUNTER — Other Ambulatory Visit: Payer: Self-pay | Admitting: Nurse Practitioner

## 2022-10-29 DIAGNOSIS — E1169 Type 2 diabetes mellitus with other specified complication: Secondary | ICD-10-CM

## 2022-10-29 DIAGNOSIS — F418 Other specified anxiety disorders: Secondary | ICD-10-CM

## 2022-12-31 ENCOUNTER — Other Ambulatory Visit: Payer: Self-pay | Admitting: Family Medicine

## 2022-12-31 DIAGNOSIS — M51369 Other intervertebral disc degeneration, lumbar region without mention of lumbar back pain or lower extremity pain: Secondary | ICD-10-CM

## 2022-12-31 NOTE — Telephone Encounter (Signed)
LAST APPOINTMENT DATE: 10/20/22 f/u DM    NEXT APPOINTMENT DATE: Visit date not found    LAST REFILL: 10/29/22  QTY: #30 no rf

## 2023-01-18 LAB — HM MAMMOGRAPHY

## 2023-01-20 LAB — HM PAP SMEAR: HPV, high-risk: NEGATIVE

## 2023-01-25 ENCOUNTER — Other Ambulatory Visit: Payer: Self-pay | Admitting: Nurse Practitioner

## 2023-04-03 ENCOUNTER — Encounter: Payer: Self-pay | Admitting: Family Medicine

## 2023-04-22 ENCOUNTER — Telehealth: Payer: Self-pay | Admitting: Family Medicine

## 2023-04-22 ENCOUNTER — Other Ambulatory Visit: Payer: Self-pay | Admitting: Family Medicine

## 2023-04-22 ENCOUNTER — Encounter: Payer: Self-pay | Admitting: Oncology

## 2023-04-22 ENCOUNTER — Other Ambulatory Visit: Payer: Self-pay | Admitting: Nurse Practitioner

## 2023-04-22 DIAGNOSIS — F418 Other specified anxiety disorders: Secondary | ICD-10-CM

## 2023-04-22 NOTE — Telephone Encounter (Signed)
Noted. Vaccine already documented in pt's chart.

## 2023-04-22 NOTE — Telephone Encounter (Signed)
Patients spouse dropped of vaccine information in providers box at front desk

## 2023-04-26 ENCOUNTER — Other Ambulatory Visit: Payer: Self-pay | Admitting: Family Medicine

## 2023-04-26 DIAGNOSIS — E1169 Type 2 diabetes mellitus with other specified complication: Secondary | ICD-10-CM

## 2023-04-29 ENCOUNTER — Encounter: Payer: Self-pay | Admitting: Oncology

## 2023-04-29 ENCOUNTER — Encounter: Payer: Self-pay | Admitting: Nurse Practitioner

## 2023-04-29 ENCOUNTER — Ambulatory Visit: Payer: 59 | Admitting: Nurse Practitioner

## 2023-04-29 VITALS — BP 90/60 | HR 108 | Ht 65.5 in | Wt 171.4 lb

## 2023-04-29 DIAGNOSIS — R1111 Vomiting without nausea: Secondary | ICD-10-CM | POA: Diagnosis not present

## 2023-04-29 DIAGNOSIS — D509 Iron deficiency anemia, unspecified: Secondary | ICD-10-CM

## 2023-04-29 DIAGNOSIS — K449 Diaphragmatic hernia without obstruction or gangrene: Secondary | ICD-10-CM | POA: Diagnosis not present

## 2023-04-29 DIAGNOSIS — R1310 Dysphagia, unspecified: Secondary | ICD-10-CM

## 2023-04-29 NOTE — Patient Instructions (Addendum)
You have been scheduled for an endoscopy. Please follow written instructions given to you at your visit today.  If you use inhalers (even only as needed), please bring them with you on the day of your procedure.  If you take any of the following medications, they will need to be adjusted prior to your procedure:   DO NOT TAKE 7 DAYS PRIOR TO TEST- Trulicity (dulaglutide) Ozempic, Wegovy (semaglutide) Mounjaro (tirzepatide) Bydureon Bcise (exanatide extended release) ____________________________________________ Avoid eating large pieces of meat,bread, and rice.   Due to recent changes in healthcare laws, you may see the results of your imaging and laboratory studies on MyChart before your provider has had a chance to review them.  We understand that in some cases there may be results that are confusing or concerning to you. Not all laboratory results come back in the same time frame and the provider may be waiting for multiple results in order to interpret others.  Please give [place] 48 hours in order for your provider to thoroughly review all the results before contacting the office for clarification of your results.   Thank you for trusting me with your gastrointestinal care!   Alcide Evener, CRNP

## 2023-04-29 NOTE — Progress Notes (Signed)
04/29/2023 CAMILLE DRAGAN [id number] April 11, 1960   Chief Complaint: Difficulty swallowing, occasional vomiting  History of Present Illness: Whitney [name] is a 63 year old female with a past medical history of anxiety, DM II 2016, pericarditis secondary to COVID, diverticulosis, constipation, GERD and Barrett's esophagus. S/P cholecystectomy in 2009. Complex paraesophageal hernia s/p Nissen Fundoplication in 2010.  She presents today for her annual review and she also has concerns regarding persistent dysphagia and intermittent vomiting.  She questions if her hiatal hernia has further progress.  She describes having food such as meat or rice which gets stuck to the mid esophagus which sometimes passes down the esophagus if she waits a few minutes and other times she gags and vomits out the stuck food.  She endorsed having 3 episodes of vomiting associated with diarrhea for 1 or 2 days which has occurred over the past 4 months.  She initially attributed the symptoms to likely having a viral illness.  No bloody or black stools.  She takes Goody powder 1 packet every other day for headaches.  She takes ASA 81 mg daily.  She remains on omeprazole 40 mg daily.  Her most recent EGD and colonoscopy were 04/11/2020. The EGD identified a large 6cm hiatal hernia with a loosened fundoplication, grade B reflux esophagitis with an ulcer at the GEJ with mild stenosis and multiple benign appearing gastric polyps. Dr. Adela Lank assessed her esophagitis/esophageal ulcer was likely the cause of her IDA in the setting of a large recurrent hiatal hernia and the angulated turn at the GEJ with mild stenosis which was widely patent was likely the cause of her dysphagia. Esophageal dilatation was not done due to the presences of an esophageal ulcer/inflammation. Biopsies showed reactive gastropathy without evidence of H. Pylori. The colonoscopy identified a tortuous colon or diverticulosis to the transverse and left colon.        Latest Ref Rng & Units 04/12/2022    2:44 PM 08/19/2021    9:32 AM 05/20/2020    4:02 PM  CBC  WBC 3.8 - 10.8 Thousand/uL 9.9  7.7  11.7   Hemoglobin 11.7 - 15.5 g/dL 82.9  56.2  13.0   Hematocrit 35.0 - 45.0 % 49.8  45.4  42.7   Platelets 140 - 400 Thousand/uL 382  402  482        Latest Ref Rng & Units 04/12/2022    2:44 PM 08/19/2021    9:32 AM 05/20/2020    4:02 PM  CMP  Glucose 65 - 99 mg/dL 865  784  696   BUN 7 - 25 mg/dL 16  21  15    Creatinine 0.50 - 1.05 mg/dL 2.95  2.84  1.32   Sodium 135 - 146 mmol/L 141  140  137   Potassium 3.5 - 5.3 mmol/L 4.4  4.9  3.9   Chloride 98 - 110 mmol/L 102  104  101   CO2 20 - 32 mmol/L 26  23  24    Calcium 8.6 - 10.4 mg/dL 44.0  9.7  9.7   Total Protein 6.1 - 8.1 g/dL  6.6    Total Bilirubin 0.2 - 1.2 mg/dL  0.3    AST 10 - 35 U/L  19    ALT 6 - 29 U/L  27      PAST GI PROCEDURES:  EGD 04/11/2020: - Esophagogastric landmarks identified. - 6 cm hiatal hernia with loosened fundoplication - LA Grade B reflux esophagitis with ulcer at the GEJ.  Mild stenosis there but widely patent. Angulated turn entering hernia sac. - Multiple benign appearing gastric polyps. Biopsied. - Normal stomach otherwise - biopsies taken to rule out H pylori  1. Surgical [P], gastric polyp - FUNDIC GLAND POLYP(S). - NO INTESTINAL METAPLASIA, DYSPLASIA, OR MALIGNANCY. 2. Surgical [P], gastric antrum and gastric body - REACTIVE GASTROPATHY. [place] IS NEGATIVE FOR HELICOBACTER PYLORI. - NO INTESTINAL METAPLASIA, DYSPLASIA, OR MALIGNANCY.    Colonoscopy 04/11/2020: - The examined portion of the ileum was normal. - Diverticulosis in the transverse colon and in the left colon. - Tortuous colon. - Internal hemorrhoids. - The examination was otherwise normal. - No polyps. - 10 year colonoscopy recall No cause for iron deficiency on this exam   EGD 11/21/2013 Dr. Juanda Chance: 1. small reducible hernia 2. is from squamocolumnar Junction to  followup on Barrett's esophagus   Colonoscopy 11/21/2013 by Dr. Juanda Chance: 1.Sessile polyp ranging between 3-44mm in size was found in the sigmoid colon; polypectomy was performed with cold forceps 2. long redundant colon. Difficult exam. 3. low volume hematochezia is likely from a rectal source Biopsy Report: 1. Surgical [P], GE junction, biopsy - GASTROESOPHAGEAL JUNCTION MUCOSA WITH MILD INFLAMMATION CONSISTENT WITH GASTROESOPHAGEAL REFLUX. NO INTESTINAL METAPLASIA, DYSPLASIA OR MALIGNANCY IDENTIFIED. 2. Surgical [P], at 15 cm, biopsy - HYPERPLASTIC POLYP(S). NO ADENOMATOUS CHANGE OR MALIGNANCY IDENTIFIED.   Esophageal manometry 12/23/2008: Showed a normal lower esophageal sphincter, 6/8 peristaltic contractions, one simultaneous contraction and one retrograde contraction.    EGD 10/14/2008: Hiatal hernia Esophagitis in the distal esophagus Otherwise normal examination Status post biopsies  Biopsy Report: GE junction to rule out Barrett's esophagus: I suspect the dysphagia is due to dysmotility. ESOPHAGUS: INTESTINAL METAPLASIA (GOBLET CELL METAPLASIA)  CONSISTENT WITH BARRETT' S ESOPHAGUS.  NO DYSPLASIA OR MALIGNANCY  IDENTIFIED.    EGD May 2006 Patient participated in a clinical study on gastroesophageal reflux.  EGD by Dr. Arlyce Dice  was reported as normal.     Colonoscopy 02/06/2002: Sigmoid diverticulosis Internal hemorrhoids  Current Outpatient Medications on File Prior to Visit  Medication Sig Dispense Refill   Accu-Chek Softclix Lancets lancets USE AS DIRECTED TO CHECK BLOOD SUGAR ONCE DAILY 100 each 3   APPLE CIDER VINEGAR PO Take by mouth. Gummy, take one by mouth daily     ascorbic acid (VITAMIN C) 500 MG tablet Take 500 mg by mouth 2 (two) times daily.     ASPIRIN PO Take 1 tablet by mouth daily.     atorvastatin (LIPITOR) 40 MG tablet Take 40 mg by mouth daily.     Biotin 76734 MCG TBDP Take by mouth. Take one by mouth daily     Cholecalciferol (VITAMIN D3) 50 MCG  (2000 UT) capsule Take 2,000 Units by mouth daily.     Coenzyme Q10 (CO Q10 PO) Take by mouth daily.     colchicine 0.6 MG tablet Take 1 tablet (0.6 mg total) by mouth daily.     Cyanocobalamin (VITAMIN B-12) 1000 MCG SUBL Place under the tongue daily. Liquid     cyclobenzaprine (FLEXERIL) 5 MG tablet TAKE 1 TABLET BY MOUTH AT BEDTIME AS NEEDED FOR MUSCLE SPASMS. 30 tablet 1   desvenlafaxine (PRISTIQ) 25 MG 24 hr tablet TAKE 1 TABLET (25 MG TOTAL) BY MOUTH DAILY. 90 tablet 1   Dulaglutide (TRULICITY) 3 MG/0.5ML SOPN Inject 3 mg as directed once a week. 6 mL 2   estradiol (ESTRACE) 0.1 MG/GM vaginal cream Place 1 Applicatorful vaginally 3 (three) times a week.  ezetimibe (ZETIA) 10 MG tablet Take 10 mg by mouth daily.     fluticasone (FLONASE) 50 MCG/ACT nasal spray PLACE 2 SPRAYS INTO BOTH NOSTRILS AS NEEDED FOR ALLERGIES. 48 mL 1   glucose blood (ACCU-CHEK GUIDE) test strip Use as instructed to check blood sugar once daily 100 each 12   hydrOXYzine (ATARAX) 25 MG tablet Take 0.5-1 tablets (12.5-25 mg total) by mouth 3 (three) times daily as needed for anxiety (sedation precautions). 30 tablet 1   JARDIANCE 25 MG TABS tablet Take 25 mg by mouth daily.     magnesium oxide (MAG-OX) 400 (240 Mg) MG tablet Take 400 mg by mouth daily.     metFORMIN (GLUCOPHAGE-XR) 500 MG 24 hr tablet TAKE 1 TABLET BY MOUTH DAILY WITH BREAKFAST AND 2 TABLETS DAILY WITH SUPPER. 270 tablet 1   metoprolol succinate (TOPROL-XL) 25 MG 24 hr tablet Take 25 mg by mouth daily.     Multiple Vitamins-Minerals (EQ MULTIVITAMINS ADULT GUMMY PO) Take by mouth. Take two by month daily     Omega-3 Fatty Acids (FISH OIL) 1200 MG CPDR Take 1,200 mg by mouth daily.     omeprazole (PRILOSEC) 40 MG capsule TAKE 1 CAP BY MOUTH DAILY. PLEASE CALL [phone] TO SCHEDULE AN OFFICE VISIT FOR MORE REFILLS. 90 capsule 0   triamcinolone cream (KENALOG) 0.1 % APPLY 1 APPLICATION TOPICALLY 2 (TWO) TIMES DAILY. 30 g 1   zinc gluconate 50 MG  tablet Take 50 mg by mouth daily.     No current facility-administered medications on file prior to visit.   Allergies  Allergen Reactions   Sertraline Hcl     REACTION: Headache/ vision changes    Current Medications, Allergies, Past Medical History, Past Surgical History, Family History and Social History were reviewed in [employer] record.  Review of Systems:   Constitutional: Negative for fever, sweats, chills or weight loss.  Respiratory: Negative for shortness of breath.   Cardiovascular: Negative for chest pain, palpitations and leg swelling.  Gastrointestinal: See HPI.  Musculoskeletal: Negative for back pain or muscle aches.  Neurological: Negative for dizziness, headaches or paresthesias.   Physical Exam: BP 90/60 (BP Location: Left Arm, Patient Position: Sitting, Cuff Size: Normal)   Pulse (!) 108   Ht 5' 5.5" (1.664 m)   Wt 171 lb 6 oz (77.7 kg)   LMP 06/17/2011   BMI 28.08 kg/m  General: 63 year old female in no acute distress. Head: Normocephalic and atraumatic. Eyes: No scleral icterus. Conjunctiva pink . Ears: Normal auditory acuity. Mouth: Dentition intact. No ulcers or lesions.  Lungs: Clear throughout to auscultation. Heart: Regular rate and rhythm, no murmur. Abdomen: Soft, nontender and nondistended. No masses or hepatomegaly. Normal bowel sounds x 4 quadrants.  Rectal: Deferred.  Musculoskeletal: Symmetrical with no gross deformities. Extremities: No edema. Neurological: Alert oriented x 4. No focal deficits.  Psychological: Alert and cooperative. Normal mood and affect  Assessment and Recommendations:  63 year old female with a history of GERD, complex paraesophageal hernia s/p Nissen Fundoplication in 2010. EGD 04/11/2020 identified a 6cm hiatal hernia with a loosened fundoplication, reflux esophagitis with ulcer at the GEJ with mild stenosis.  Persistent intermittent dysphagia (angulated turn at the GEJ with mild stenosis per  EGD 04/11/2020 possible causing her dysphagia). Patient endorses having 3 episodes of vomiting within the past 4 months in setting of loosened fundoplication wrap.  On ASA daily and taking Goody powder 1 packet every other day for headaches. -Continue Omeprazole 40mg   QD  -EGD to reassess reflux esophagitis and GE junction for ulcer healing prior to considering possible esophageal dilatation -Patient aware EGD tentatively scheduled, Dr. Adela Lank to further review prior to the patient proceeding with an EGD -Patient to hold Trulicity for 1 week prior to EGD date -Patient instructed to avoid eating large pieces of meat/rice -Patient instructed to stop Goody powder use.    IDA, thought to be due to her large recurrent hiatal hernia. No overt GI bleeding. No evidence of Cameron lesions per EGD 03/2020.  -Continue routine CBC and iron levels with PCP   Colon cancer screening. Colonoscopy 04/11/2020 identified a tortuous colon or diverticulosis to the transverse and left colon. -Next colonoscopy due 03/2030

## 2023-04-30 NOTE — Progress Notes (Signed)
Agree with assessment and plan as outlined. Would pursue EGD for these symptoms, assess for interval healing of esophageal ulceration and consider empiric dilation. With these symptoms she needs to avoid NSAIDs including goody powder. Thanks

## 2023-05-01 NOTE — Progress Notes (Signed)
Whitney [name], pls contact patient and let her know Dr. Adela Lank wanted her to proceed with her EGD as scheduled and he also advised no further Goody powder use. THX.

## 2023-05-02 ENCOUNTER — Telehealth: Payer: Self-pay

## 2023-05-02 NOTE — Telephone Encounter (Signed)
Pt was made aware of Dr. Adela Lank and Alcide Evener NP recommendations. Pt verbalized understanding with all questions answered.

## 2023-05-02 NOTE — Telephone Encounter (Signed)
Message Received: Whitney [name], Whitney Carl, NP  Emeline Darling, RN      Previous Messages  Routed Note  Author: Arnaldo Natal, NP Service: Gastroenterology Author Type: Nurse Practitioner  Filed: 05/01/2023  1:47 PM Encounter Date: 04/29/2023 Status: Signed  Editor: Arnaldo Natal, NP (Nurse Practitioner)  Viviann Spare, pls contact patient and let her know Dr. Adela Lank wanted her to proceed with her EGD as scheduled and he also advised no further Goody powder use. THX.     Office Visit for Gastroesophageal Reflux 04/29/2023 Arnaldo Natal, NP - [facility] Diagnoses  Hiatal Hernia (Primary) Dysphagia, unspecified type Orders Signed This Visit  (1) Ambulatory referral to Gastroenterology Orders Pended This Visit  None Progress Notes  Arnaldo Natal, NP at 04/29/2023  3:00 PM  Status: Signed  Viviann Spare, pls contact patient and let her know Dr. Adela Lank wanted her to proceed with her EGD as scheduled and he also advised no further Goody powder use. THX.    Benancio Deeds, MD at 04/29/2023  3:00 PM  Status: Signed  Agree with assessment and plan as outlined. Would pursue EGD for these symptoms, assess for interval healing of esophageal ulceration and consider empiric dilation. With these symptoms she needs to avoid NSAIDs including goody powder. Thanks

## 2023-05-16 ENCOUNTER — Encounter: Payer: Self-pay | Admitting: Family Medicine

## 2023-05-24 ENCOUNTER — Encounter: Payer: Self-pay | Admitting: Gastroenterology

## 2023-06-02 ENCOUNTER — Ambulatory Visit: Payer: 59 | Admitting: Gastroenterology

## 2023-06-02 ENCOUNTER — Encounter: Payer: Self-pay | Admitting: Gastroenterology

## 2023-06-02 ENCOUNTER — Telehealth: Payer: Self-pay

## 2023-06-02 VITALS — BP 106/66 | HR 74 | Temp 97.5°F | Resp 12 | Ht 65.0 in | Wt 171.0 lb

## 2023-06-02 DIAGNOSIS — K2100 Gastro-esophageal reflux disease with esophagitis, without bleeding: Secondary | ICD-10-CM | POA: Diagnosis present

## 2023-06-02 DIAGNOSIS — R1310 Dysphagia, unspecified: Secondary | ICD-10-CM | POA: Diagnosis not present

## 2023-06-02 DIAGNOSIS — Q399 Congenital malformation of esophagus, unspecified: Secondary | ICD-10-CM | POA: Diagnosis not present

## 2023-06-02 DIAGNOSIS — K449 Diaphragmatic hernia without obstruction or gangrene: Secondary | ICD-10-CM

## 2023-06-02 DIAGNOSIS — K317 Polyp of stomach and duodenum: Secondary | ICD-10-CM | POA: Diagnosis not present

## 2023-06-02 MED ORDER — OMEPRAZOLE 40 MG PO CPDR
40.0000 mg | DELAYED_RELEASE_CAPSULE | Freq: Two times a day (BID) | ORAL | 3 refills | Status: AC
Start: 2023-06-02 — End: ?

## 2023-06-02 MED ORDER — SODIUM CHLORIDE 0.9 % IV SOLN: 500.0000 mL | Freq: Once | INTRAVENOUS | Status: DC

## 2023-06-02 NOTE — Progress Notes (Signed)
 [facility] History and Physical   Primary Care Physician:  Eustaquio Boyden, MD   Reason for Procedure:   GERD, dysphagia  Plan:    EGD     HPI: Whitney [name] is a 63 y.o. female  here for EGD to evaluate history of GERD and dysphagia.  Her most recent EGD and colonoscopy were 04/11/2020. The EGD identified a large 6cm hiatal hernia with a loosened fundoplication, grade B reflux esophagitis with an ulcer at the GEJ with mild stenosis and multiple benign appearing gastric polyps. Thought the angulated turn at the GEJ with mild stenosis which was widely patent was likely the cause of her dysphagia. Esophageal dilatation was not done due to the presences of an esophageal ulcer/inflammation.   On omeprazole , has been taking goody powder daily in recent months for headaches. History of vomiting in recent months. Also on Trulicity. History of IDA due to large hiatal hernia. EGD to reassess and ensure mucosal healing of ulcer.    I have discussed risks / benefits of anesthesia and endoscopic procedure with Whitney [name] and they wish to proceed with the exams as outlined today.    Past Medical History:  Diagnosis Date   Allergy    Seasonal   Anxiety    Attention deficit disorder without mention of hyperactivity    Barrett's esophagus    Cancer (HCC)    skin cancer on nose   CHOLELITHIASIS 05/26/2007   Qualifier: Diagnosis of  By: Whitney [name], Whitney [name]    Congenital anomalies of foot, not elsewhere classified    CONSTIPATION 10/09/2008   Qualifier: Diagnosis of  By: Whitney [name], Whitney [name]    Depression    Diabetes mellitus without complication (HCC)    Displacement of intervertebral disc, site unspecified, without myelopathy    DIVERTICULAR DISEASE 02/06/2002   Qualifier: Diagnosis of  By: Whitney [name], Whitney [name]    Diverticulosis    Elevated blood pressure reading without diagnosis of hypertension    Esophageal reflux    Gallstones     Gastroenteritis 07/27/2016   Hiatal hernia    HIATAL HERNIA 10/16/2008   Qualifier: Diagnosis of  By: Whitney [name], Whitney [name]    HLD (hyperlipidemia)    Hypoglycemia, unspecified    Internal hemorrhoids without mention of complication    Other abnormal blood chemistry     Past Surgical History:  Procedure Laterality Date   BREAST CYST EXCISION Left    CARDIAC CATHETERIZATION     [facility]   CESAREAN SECTION     CHOLECYSTECTOMY  06/16/2007   Dr. Lindie Spruce   COLONOSCOPY  02/06/2002   diverticula and internal hemorroids   COLONOSCOPY  2015   COLONOSCOPY  03/2020   diverticulosis, tortuous colon, int hem, rpt 10 yrs (Whitney [name]).   ESOPHAGOGASTRODUODENOSCOPY  03/2020   recurrent large HH with esophagitis with ulceration (Whitney [name])   HIATAL HERNIA REPAIR     LUMBAR LAMINECTOMY     L5-S1    Prior to Admission medications   Medication Sig Start Date End Date Taking? Authorizing Provider  Accu-Chek Softclix Lancets lancets USE AS DIRECTED TO CHECK BLOOD SUGAR ONCE DAILY 08/18/22  Yes Eustaquio Boyden, MD  APPLE CIDER VINEGAR PO Take by mouth. Gummy, take one by mouth daily   Yes [provider]  ASPIRIN PO Take 1 tablet by mouth daily.   Yes [provider]  atorvastatin (LIPITOR) 40 MG tablet Take 40 mg by mouth daily.   Yes [provider]  colchicine  0.6 MG tablet Take 1 tablet (0.6 mg total) by mouth daily. 05/22/21  Yes Eustaquio Boyden, MD  Cyanocobalamin (VITAMIN B-12) 1000 MCG SUBL Place under the tongue daily. Liquid   Yes [provider]  glucose blood (ACCU-CHEK GUIDE) test strip Use as instructed to check blood sugar once daily 04/08/22  Yes Eustaquio Boyden, MD  Multiple Vitamins-Minerals (EQ MULTIVITAMINS ADULT GUMMY PO) Take by mouth. Take two by month daily   Yes [provider]  Omega-3 Fatty Acids (FISH OIL) 1200 MG CPDR Take 1,200 mg by mouth daily.   Yes [provider]  omeprazole (PRILOSEC) 40 MG capsule TAKE  1 CAP BY MOUTH DAILY. PLEASE CALL [phone] TO SCHEDULE AN OFFICE VISIT FOR MORE REFILLS. 04/22/23  Yes Arnaldo Natal, NP  ascorbic acid (VITAMIN C) 500 MG tablet Take 500 mg by mouth 2 (two) times daily.    [provider]  Biotin 62952 MCG TBDP Take by mouth. Take one by mouth daily    [provider]  Cholecalciferol (VITAMIN D3) 50 MCG (2000 UT) capsule Take 2,000 Units by mouth daily.    [provider]  Coenzyme Q10 (CO Q10 PO) Take by mouth daily.    [provider]  cyclobenzaprine (FLEXERIL) 5 MG tablet TAKE 1 TABLET BY MOUTH AT BEDTIME AS NEEDED FOR MUSCLE SPASMS. 01/04/23   Eustaquio Boyden, MD  desvenlafaxine (PRISTIQ) 25 MG 24 hr tablet TAKE 1 TABLET (25 MG TOTAL) BY MOUTH DAILY. 04/22/23   Eustaquio Boyden, MD  Dulaglutide (TRULICITY) 3 MG/0.5ML SOPN Inject 3 mg as directed once a week. 10/21/22   Eustaquio Boyden, MD  estradiol (ESTRACE) 0.1 MG/GM vaginal cream Place 1 Applicatorful vaginally 3 (three) times a week. 01/18/23   [provider]  ezetimibe (ZETIA) 10 MG tablet Take 10 mg by mouth daily. 05/01/21   [provider]  fluticasone (FLONASE) 50 MCG/ACT nasal spray PLACE 2 SPRAYS INTO BOTH NOSTRILS AS NEEDED FOR ALLERGIES. 12/07/19 04/29/23  Theadore Nan, NP  hydrOXYzine (ATARAX) 25 MG tablet Take 0.5-1 tablets (12.5-25 mg total) by mouth 3 (three) times daily as needed for anxiety (sedation precautions). 10/30/21   Eustaquio Boyden, MD  JARDIANCE 25 MG TABS tablet Take 25 mg by mouth daily. 03/11/21   [provider]  magnesium oxide (MAG-OX) 400 (240 Mg) MG tablet Take 400 mg by mouth daily.    [provider]  metFORMIN (GLUCOPHAGE-XR) 500 MG 24 hr tablet TAKE 1 TABLET BY MOUTH DAILY WITH BREAKFAST AND 2 TABLETS DAILY WITH SUPPER. 04/26/23   Eustaquio Boyden, MD  metoprolol succinate (TOPROL-XL) 25 MG 24 hr tablet Take 25 mg by mouth daily.    [provider]  triamcinolone cream  (KENALOG) 0.1 % APPLY 1 APPLICATION TOPICALLY 2 (TWO) TIMES DAILY. 04/26/17   Dianne Dun, MD  zinc gluconate 50 MG tablet Take 50 mg by mouth daily.    [provider]    Current Outpatient Medications  Medication Sig Dispense Refill   Accu-Chek Softclix Lancets lancets USE AS DIRECTED TO CHECK BLOOD SUGAR ONCE DAILY 100 each 3   APPLE CIDER VINEGAR PO Take by mouth. Gummy, take one by mouth daily     ASPIRIN PO Take 1 tablet by mouth daily.     atorvastatin (LIPITOR) 40 MG tablet Take 40 mg by mouth daily.     colchicine 0.6 MG tablet Take 1 tablet (0.6 mg total) by mouth daily.     Cyanocobalamin (VITAMIN B-12) 1000 MCG SUBL Place  under the tongue daily. Liquid     glucose blood (ACCU-CHEK GUIDE) test strip Use as instructed to check blood sugar once daily 100 each 12   Multiple Vitamins-Minerals (EQ MULTIVITAMINS ADULT GUMMY PO) Take by mouth. Take two by month daily     Omega-3 Fatty Acids (FISH OIL) 1200 MG CPDR Take 1,200 mg by mouth daily.     omeprazole (PRILOSEC) 40 MG capsule TAKE 1 CAP BY MOUTH DAILY. PLEASE CALL [phone] TO SCHEDULE AN OFFICE VISIT FOR MORE REFILLS. 90 capsule 0   ascorbic acid (VITAMIN C) 500 MG tablet Take 500 mg by mouth 2 (two) times daily.     Biotin 10272 MCG TBDP Take by mouth. Take one by mouth daily     Cholecalciferol (VITAMIN D3) 50 MCG (2000 UT) capsule Take 2,000 Units by mouth daily.     Coenzyme Q10 (CO Q10 PO) Take by mouth daily.     cyclobenzaprine (FLEXERIL) 5 MG tablet TAKE 1 TABLET BY MOUTH AT BEDTIME AS NEEDED FOR MUSCLE SPASMS. 30 tablet 1   desvenlafaxine (PRISTIQ) 25 MG 24 hr tablet TAKE 1 TABLET (25 MG TOTAL) BY MOUTH DAILY. 90 tablet 1   Dulaglutide (TRULICITY) 3 MG/0.5ML SOPN Inject 3 mg as directed once a week. 6 mL 2   estradiol (ESTRACE) 0.1 MG/GM vaginal cream Place 1 Applicatorful vaginally 3 (three) times a week.     ezetimibe (ZETIA) 10 MG tablet Take 10 mg by mouth daily.     fluticasone (FLONASE) 50 MCG/ACT  nasal spray PLACE 2 SPRAYS INTO BOTH NOSTRILS AS NEEDED FOR ALLERGIES. 48 mL 1   hydrOXYzine (ATARAX) 25 MG tablet Take 0.5-1 tablets (12.5-25 mg total) by mouth 3 (three) times daily as needed for anxiety (sedation precautions). 30 tablet 1   JARDIANCE 25 MG TABS tablet Take 25 mg by mouth daily.     magnesium oxide (MAG-OX) 400 (240 Mg) MG tablet Take 400 mg by mouth daily.     metFORMIN (GLUCOPHAGE-XR) 500 MG 24 hr tablet TAKE 1 TABLET BY MOUTH DAILY WITH BREAKFAST AND 2 TABLETS DAILY WITH SUPPER. 270 tablet 1   metoprolol succinate (TOPROL-XL) 25 MG 24 hr tablet Take 25 mg by mouth daily.     triamcinolone cream (KENALOG) 0.1 % APPLY 1 APPLICATION TOPICALLY 2 (TWO) TIMES DAILY. 30 g 1   zinc gluconate 50 MG tablet Take 50 mg by mouth daily.     Current Facility-Administered Medications  Medication Dose Route Frequency Provider Last Rate Last Admin   0.9 %  sodium chloride infusion  500 mL Intravenous Once Rayder Sullenger, Willaim Rayas, MD        Allergies as of 06/02/2023 - Review Complete 06/02/2023  Allergen Reaction Noted   Sertraline hcl      Family History  Problem Relation Age of Onset   Colon polyps Mother    Hypertension Father    Diabetes Father    Heart disease Father    Colon polyps Maternal Grandmother    Heart attack Maternal Grandmother    Heart attack Paternal Grandfather    Diabetes Maternal Aunt    Prostate cancer Maternal Grandfather    Colon cancer Paternal Grandmother    Stomach cancer Neg Hx    Esophageal cancer Neg Hx    Pancreatic cancer Neg Hx    Rectal cancer Neg Hx     Social History   Socioeconomic History   Marital status: Married    Spouse name: Not on file   Number of children: 2  Years of education: Not on file   Highest education level: Associate degree: occupational, Scientist, product/process development, or vocational program  Occupational History   Occupation: Self employeed- Biomedical engineer  Tobacco Use   Smoking status: Former    Current packs/day: 0.00     Types: Cigarettes    Quit date: 1979    Years since quitting: 46.2   Smokeless tobacco: Never   Tobacco comments:    2 years as a teenager  Vaping Use   Vaping status: Never Used  Substance and Sexual Activity   Alcohol use: Not Currently    Alcohol/week: 0.0 standard drinks of alcohol   Drug use: No   Sexual activity: Yes  Other Topics Concern   Not on file  Social History Narrative   Not on file   Social Drivers of Health   Financial Resource Strain: Low Risk  (08/26/2022)   Overall Financial Resource Strain (CARDIA)    Difficulty of Paying Living Expenses: Not hard at all  Food Insecurity: No Food Insecurity (08/26/2022)   Hunger Vital Sign    Worried About Running Out of Food in the Last Year: Never true    Ran Out of Food in the Last Year: Never true  Transportation Needs: No Transportation Needs (08/26/2022)   PRAPARE - Administrator, Civil Service (Medical): No    Lack of Transportation (Non-Medical): No  Physical Activity: Insufficiently Active (08/26/2022)   Exercise Vital Sign    Days of Exercise per Week: 2 days    Minutes of Exercise per Session: 30 min  Stress: Stress Concern Present (08/26/2022)   [employer] of Occupational Health - Occupational Stress Questionnaire    Feeling of Stress : To some extent  Social Connections: Moderately Integrated (08/26/2022)   Social Connection and Isolation Panel [NHANES]    Frequency of Communication with Friends and Family: More than three times a week    Frequency of Social Gatherings with Friends and Family: Twice a week    Attends Religious Services: More than 4 times per year    Active Member of [employer] or Organizations: No    Attends Engineer, structural: Not on file    Marital Status: Married  Catering manager Violence: Not on file    Review of Systems: All other review of systems negative except as mentioned in the HPI.  Physical Exam: Vital signs BP 127/73   Pulse 85   Temp (!) 97.5  F (36.4 C)   Ht 5\' 5"  (1.651 m)   Wt 171 lb (77.6 kg)   LMP 06/17/2011   SpO2 98%   BMI 28.46 kg/m   General:   Alert,  Well-developed, pleasant and cooperative in NAD Lungs:  Clear throughout to auscultation.   Heart:  Regular rate and rhythm Abdomen:  Soft, nontender and nondistended.   Neuro/Psych:  Alert and cooperative. Normal mood and affect. A and O x 3  Harlin Rain, MD [facility]

## 2023-06-02 NOTE — Op Note (Signed)
 [facility] Patient Name: Whitney [name] Procedure Date: 06/02/2023 4:12 PM MRN: [id number] Endoscopist: Viviann Spare P. Adela Lank , MD, [id number] Age: 63 Referring MD:  Date of Birth: 14-Jun-1960 Gender: Female Account #: [id number] Procedure:                Upper GI endoscopy Indications:              Dysphagia, Follow-up of reflux esophagitis - LA                            Grade B esophagitis noted on the last exam. Now on                            omeprazole daily which has helped symptoms, still                            has intermittent dysphagia and some reflux at                            times. EGD to ensure mucosal healing, rule out                            Barrett's. History of complex hiatal hernia repair                            with Nissen fundoplication in 2010. Medicines:                Monitored Anesthesia Care Procedure:                Pre-Anesthesia Assessment:                           - Prior to the procedure, a History and Physical                            was performed, and patient medications and                            allergies were reviewed. The patient's tolerance of                            previous anesthesia was also reviewed. The risks                            and benefits of the procedure and the sedation                            options and risks were discussed with the patient.                            All questions were answered, and informed consent                            was obtained. Prior Anticoagulants: The patient has  taken no anticoagulant or antiplatelet agents. ASA                            Grade Assessment: II - A patient with mild systemic                            disease. After reviewing the risks and benefits,                            the patient was deemed in satisfactory condition to                            undergo the procedure.                           After obtaining  informed consent, the endoscope was                            passed under direct vision. Throughout the                            procedure, the patient's blood pressure, pulse, and                            oxygen saturations were monitored continuously. The                            GIF [id number] #[id number] was introduced through the                            mouth, and advanced to the second part of duodenum.                            The upper GI endoscopy was accomplished without                            difficulty. The patient tolerated the procedure                            well. Scope In: Scope Out: Findings:                 Esophagogastric landmarks were identified: the                            Z-line was found at 31 cm, the gastroesophageal                            junction was found at 31 cm and the upper extent of                            the gastric folds was found at 37 cm from the  incisors.                           A 6 cm hiatal hernia was present.                           LA Grade A esophagitis was found in the distal                            esophagus.                           The distal esophagus was tortuous - very angulated                            turn entering the hernia sac, likely cause of                            dysphagia. Pooled secretions noted there and frank                            reflux of bile noted during the procedure. .                           The exam of the esophagus was otherwise normal. No                            Barrett's esophagus.                           Multiple sessile polyps were found in the gastric                            body. The patient has known fundic gland polyps                            previously biopsied - no further biopsies taken                            today.                           The exam of the stomach was otherwise normal. Of                             note, given the large hiatal hernia the patient                            continously belched air, could not retain air well                            enough to perform retroflexed views. Multiple                            attempts made but not  successful secondary to poor                            air retention of the stomach.                           The examined duodenum was normal. Complications:            No immediate complications. Estimated blood loss:                            Minimal. Estimated Blood Loss:     Estimated blood loss was minimal. Impression:               - Esophagogastric landmarks identified.                           - 6 cm hiatal hernia.                           - LA Grade A reflux esophagitis.                           - Tortuous esophagus with angulated turn into the                            hernia sac, likely cause of dysphagia.                           - No Barrett's esophagus.                           - Multiple gastric polyps - stable since the last                            exam (fundic gland polyps).                           - Poor gastric distension due to hernia,                            retroflexed views of the cardia not able to be                            obtained.                           - Normal examined duodenum. Recommendation:           - Patient has a contact number available for                            emergencies. The signs and symptoms of potential                            delayed complications were discussed with the  patient. Return to normal activities tomorrow.                            Written discharge instructions were provided to the                            patient.                           - Resume previous diet.                           - Continue present medications.                           - Increase omeprazole to twice daily dosing or                             consider transition to Vonoprazan                           - Patient may benefit from hiatal hernia repair and                            redo Nissen - will discuss with her how aggressive                            she wishes to be with management of this Viviann Spare P. Jillian Pianka, MD 06/02/2023 4:46:11 PM This report has been signed electronically.

## 2023-06-02 NOTE — Patient Instructions (Addendum)
 Resume previous diet Continue present medications--increase omeprazole to twice daily, new Rx sent  Referral to Dr Andrey Campanile  Handouts/information given for Hiatal hernia  YOU HAD AN ENDOSCOPIC PROCEDURE TODAY AT THE [facility]:   Refer to the procedure report that was given to you for any specific questions about what was found during the examination.  If the procedure report does not answer your questions, please call your gastroenterologist to clarify.  If you requested that your care partner not be given the details of your procedure findings, then the procedure report has been included in a sealed envelope for you to review at your convenience later.  YOU SHOULD EXPECT: Some feelings of bloating in the abdomen. Passage of more gas than usual.  Walking can help get rid of the air that was put into your GI tract during the procedure and reduce the bloating. If you had a lower endoscopy (such as a colonoscopy or flexible sigmoidoscopy) you may notice spotting of blood in your stool or on the toilet paper. If you underwent a bowel prep for your procedure, you may not have a normal bowel movement for a few days.  Please Note:  You might notice some irritation and congestion in your nose or some drainage.  This is from the oxygen used during your procedure.  There is no need for concern and it should clear up in a day or so.  SYMPTOMS TO REPORT IMMEDIATELY:  Following upper endoscopy (EGD)  Vomiting of blood or coffee ground material  New chest pain or pain under the shoulder blades  Painful or persistently difficult swallowing  New shortness of breath  Fever of 100F or higher  Black, tarry-looking stools  For urgent or emergent issues, a gastroenterologist can be reached at any hour by calling [phone]. Do not use MyChart messaging for urgent concerns.   DIET:  We do recommend a small meal at first, but then you may proceed to your regular diet.  Drink plenty of fluids but  you should avoid alcoholic beverages for 24 hours.  ACTIVITY:  You should plan to take it easy for the rest of today and you should NOT DRIVE or use heavy machinery until tomorrow (because of the sedation medicines used during the test).    FOLLOW UP: Our staff will call the number listed on your records the next business day following your procedure.  We will call around 7:15- 8:00 am to check on you and address any questions or concerns that you may have regarding the information given to you following your procedure. If we do not reach you, we will leave a message.      SIGNATURES/CONFIDENTIALITY: You and/or your care partner have signed paperwork which will be entered into your electronic medical record.  These signatures attest to the fact that that the information above on your After Visit Summary has been reviewed and is understood.  Full responsibility of the confidentiality of this discharge information lies with you and/or your care-partner.

## 2023-06-02 NOTE — Progress Notes (Signed)
 Sedate, gd SR, tolerated procedure well, VSS, report to RN

## 2023-06-02 NOTE — Telephone Encounter (Signed)
-----   Message from Benancio Deeds sent at 06/02/2023  4:54 PM EST ----- Regarding: referral to [facility] can you please refer this patient to Dr. Andrey Campanile - [facility] - recurrent hiatal hernia / refractory reflux. Thanks

## 2023-06-03 NOTE — Telephone Encounter (Signed)
  Follow up Call-     06/02/2023    3:04 PM  Call back number  Post procedure Call Back phone  # [phone]  Permission to leave phone message Yes     Patient questions:  Do you have a fever, pain , or abdominal swelling? No. Pain Score  0 *  Have you tolerated food without any problems? Yes.    Have you been able to return to your normal activities? Yes.    Do you have any questions about your discharge instructions: Diet   No. Medications  No. Follow up visit  No.  Do you have questions or concerns about your Care? No.  Actions: * If pain score is 4 or above: No action needed, pain <4.

## 2023-06-13 ENCOUNTER — Encounter: Payer: Self-pay | Admitting: Gastroenterology

## 2023-06-20 ENCOUNTER — Telehealth: Payer: Self-pay | Admitting: Gastroenterology

## 2023-06-20 NOTE — Telephone Encounter (Signed)
 Noted.

## 2023-06-20 NOTE — Telephone Encounter (Signed)
 [facility] called and stated that this patient has been schedule for April the 8 th at 3 PM with Dr. Andrey Campanile. Please advise.

## 2023-07-06 ENCOUNTER — Encounter: Payer: Self-pay | Admitting: Family Medicine

## 2023-07-06 ENCOUNTER — Telehealth: Payer: Self-pay | Admitting: *Deleted

## 2023-07-06 ENCOUNTER — Other Ambulatory Visit: Payer: Self-pay | Admitting: *Deleted

## 2023-07-06 DIAGNOSIS — K449 Diaphragmatic hernia without obstruction or gangrene: Secondary | ICD-10-CM

## 2023-07-06 NOTE — Telephone Encounter (Signed)
-----   Message from Benancio Deeds sent at 07/05/2023  5:36 PM EDT ----- Regarding: RE: recurrent hiatal hernia pt - needs manometry Thanks Minerva Areola, appreciate you seeing her. Yes we can order that, I am not sure what wait time is for manometry, will check.  POD A RN - can you please refer this patient for manometry and can you let us know when first available booking is for this? Thanks  Brett Canales ----- Message ----- From: Gaynelle Adu, MD Sent: 07/05/2023   4:45 PM EDT To: Campbell Stall; Benancio Deeds, MD Subject: recurrent hiatal hernia pt - needs manometry   Hey  I saw this lady; h/o lap PEH with nissen in 2010 by Dr Daphine Deutscher; now with recurrence. Note in care everywhere.   Can you order esophageal manometry?    Thanks Minerva Areola

## 2023-07-06 NOTE — Telephone Encounter (Signed)
 Esophageal Manometry has been scheduled at [facility] for first available date, 11/16/23 at 830 am. I have spoken to patient to advise of this information and have given her generalized prep instructions as well as written prep instructions via mychart for her review. She verbalizes understanding.

## 2023-07-06 NOTE — Telephone Encounter (Signed)
 Thanks Dottie.   Minerva Areola - FYI - first available opening for manometry unfortunately not open until August.

## 2023-07-07 NOTE — Telephone Encounter (Signed)
 Okay that's fine, I understand.

## 2023-08-02 ENCOUNTER — Ambulatory Visit: Payer: Self-pay

## 2023-08-02 ENCOUNTER — Ambulatory Visit: Admitting: Internal Medicine

## 2023-08-02 VITALS — BP 102/66 | HR 97 | Temp 97.7°F | Ht 65.5 in | Wt 173.0 lb

## 2023-08-02 DIAGNOSIS — R0781 Pleurodynia: Secondary | ICD-10-CM

## 2023-08-02 NOTE — Assessment & Plan Note (Signed)
 Mechanism of injury and PE argue against fracture May be contusion Doesn't seem like rib dislocation Heat might help Can try aleve 2 twice a day for 1-2 weeks Topical lidocaine may be best

## 2023-08-02 NOTE — Telephone Encounter (Signed)
 Copied from CRM [id number]. Topic: Clinical - Red Word Triage >> Aug 02, 2023  8:57 AM Annelle Kiel wrote: Red Word that prompted transfer to Nurse Triage: patient was doing some work yesterday and was bending over and now has a sharp pain in her lower rib area causing her to not be able to sleep and when she takes deep breaths it is bothering her  Chief Complaint: right lower rib pain Symptoms: pain Frequency: constant x 2 1/2 weeks Pertinent Negatives: Patient denies bowel/bladder issues, numbness/tingling in legs and arms Disposition: [] ED /[] Urgent Care (no appt availability in office) / [x] Appointment(In office/virtual)/ []  [facility]/ [] Home Care/ [] Refused Recommended Disposition /[] [facility]/ []  Follow-up with PCP Additional Notes: per protocol apt made for today; care advice given, denies questions; instructed to go to ER if becomes worse.   Reason for Disposition  [1] SEVERE pain (e.g., excruciating) AND [2] not improved 2 hours after pain medicine/ice packs  Answer Assessment - Initial Assessment Questions 1. MECHANISM: "How did the injury happen?" (Consider the possibility of domestic violence or elder abuse)     Doing some house work and developed lower rib pain on right side 2. ONSET: "When did the injury happen?" (Minutes or hours ago)     Two and half weeks ago 3. LOCATION: "What part of the back is injured?"     Right lower rib 4. SEVERITY: "Can you move the back normally?"     States can't get comfortable at night 5. PAIN: "Is there any pain?" If Yes, ask: "How bad is the pain?"   (Scale 1-10; or mild, moderate, severe)     severe 6. CORD SYMPTOMS: Any weakness or numbness of the arms or legs?"     no 7. SIZE: For cuts, bruises, or swelling, ask: "How large is it?" (e.g., inches or centimeters)     no 8. TETANUS: For any breaks in the skin, ask: "When was the last tetanus booster?"     na 9. OTHER SYMPTOMS: "Do you have any other symptoms?" (e.g.,  abdomen pain, blood in urine)     no 10. PREGNANCY: "Is there any chance you are pregnant?" "When was your last menstrual period?"       na  Protocols used: Back Injury-A-AH

## 2023-08-02 NOTE — Patient Instructions (Signed)
 I would recommend aleve (naproxen 220mg ) 2 tabs twice a day with food for the next week or so. You can try topical lidocaine--patch or gel--especially at night --so you can sleep better

## 2023-08-02 NOTE — Progress Notes (Signed)
 Subjective:    Patient ID: Whitney [name], female    DOB: 1961-01-01, 63 y.o.   MRN: [id number]  HPI Here due to right rib pain  Was painting daughter's bathroom Leaning down to get behind commode--was resting her right chest against--then sudden pain Stood up--pain persisted No fall or pressure against Kept painting--went to different position  Fairly constant pain--along anterior axillary line of lower ribs Worse with deep breath No cough Not resting well at night--bothers her at night  Tried 2 extra strength tylenol--no clear help No topical Rx  Current Outpatient Medications on File Prior to Visit  Medication Sig Dispense Refill   Accu-Chek Softclix Lancets lancets USE AS DIRECTED TO CHECK BLOOD SUGAR ONCE DAILY 100 each 3   APPLE CIDER VINEGAR PO Take by mouth. Gummy, take one by mouth daily     ascorbic acid (VITAMIN C) 500 MG tablet Take 500 mg by mouth 2 (two) times daily.     ASPIRIN PO Take 1 tablet by mouth daily.     atorvastatin (LIPITOR) 40 MG tablet Take 40 mg by mouth daily.     Cholecalciferol (VITAMIN D3) 50 MCG (2000 UT) capsule Take 2,000 Units by mouth daily.     colchicine 0.6 MG tablet Take 1 tablet (0.6 mg total) by mouth daily.     Cyanocobalamin (VITAMIN B-12) 1000 MCG SUBL Place under the tongue daily. Liquid     cyclobenzaprine  (FLEXERIL ) 5 MG tablet TAKE 1 TABLET BY MOUTH AT BEDTIME AS NEEDED FOR MUSCLE SPASMS. 30 tablet 1   Dulaglutide  (TRULICITY ) 3 MG/0.5ML SOPN Inject 3 mg as directed once a week. 6 mL 2   estradiol (ESTRACE) 0.1 MG/GM vaginal cream Place 1 Applicatorful vaginally 3 (three) times a week.     ezetimibe (ZETIA) 10 MG tablet Take 10 mg by mouth daily.     glucose blood (ACCU-CHEK GUIDE) test strip Use as instructed to check blood sugar once daily 100 each 12   JARDIANCE 25 MG TABS tablet Take 25 mg by mouth daily.     magnesium  oxide (MAG-OX) 400 (240 Mg) MG tablet Take 400 mg by mouth daily.     metFORMIN  (GLUCOPHAGE -XR) 500 MG 24  hr tablet TAKE 1 TABLET BY MOUTH DAILY WITH BREAKFAST AND 2 TABLETS DAILY WITH SUPPER. 270 tablet 1   metoprolol succinate (TOPROL-XL) 25 MG 24 hr tablet Take 25 mg by mouth daily.     Multiple Vitamins-Minerals (EQ MULTIVITAMINS ADULT GUMMY PO) Take by mouth. Take two by month daily     Omega-3 Fatty Acids (FISH OIL) 1200 MG CPDR Take 1,200 mg by mouth daily.     omeprazole  (PRILOSEC) 40 MG capsule Take 1 capsule (40 mg total) by mouth 2 (two) times daily before lunch and supper. 180 capsule 3   triamcinolone  cream (KENALOG ) 0.1 % APPLY 1 APPLICATION TOPICALLY 2 (TWO) TIMES DAILY. 30 g 1   Biotin 10000 MCG TBDP Take by mouth. Take one by mouth daily (Patient not taking: Reported on 08/02/2023)     Coenzyme Q10 (CO Q10 PO) Take by mouth daily. (Patient not taking: Reported on 08/02/2023)     fluticasone  (FLONASE ) 50 MCG/ACT nasal spray PLACE 2 SPRAYS INTO BOTH NOSTRILS AS NEEDED FOR ALLERGIES. 48 mL 1   zinc gluconate 50 MG tablet Take 50 mg by mouth daily. (Patient not taking: Reported on 08/02/2023)     No current facility-administered medications on file prior to visit.    Allergies  Allergen Reactions   Sertraline  Hcl     REACTION: Headache/ vision changes    Past Medical History:  Diagnosis Date   Allergy    Seasonal   Anxiety    Attention deficit disorder without mention of hyperactivity    Barrett's esophagus    Cancer (HCC)    skin cancer on nose   CHOLELITHIASIS 05/26/2007   Qualifier: Diagnosis of  By: Mildred All FNP, Billie-Lynn Daniels    Congenital anomalies of foot, not elsewhere classified    CONSTIPATION 10/09/2008   Qualifier: Diagnosis of  By: Mildred All FNP, Gwyn Leos    Depression    Diabetes mellitus without complication (HCC)    Displacement of intervertebral disc, site unspecified, without myelopathy    DIVERTICULAR DISEASE 02/06/2002   Qualifier: Diagnosis of  By: Mildred All FNP, Billie-Lynn Daniels    Diverticulosis    Elevated blood pressure reading without  diagnosis of hypertension    Esophageal reflux    Gallstones    Gastroenteritis 07/27/2016   Hiatal hernia    HIATAL HERNIA 10/16/2008   Qualifier: Diagnosis of  By: Mildred All FNP, Gwyn Leos    HLD (hyperlipidemia)    Hypoglycemia, unspecified    Internal hemorrhoids without mention of complication    Other abnormal blood chemistry     Past Surgical History:  Procedure Laterality Date   BREAST CYST EXCISION Left    CARDIAC CATHETERIZATION     [facility]   CESAREAN SECTION     CHOLECYSTECTOMY  06/16/2007   Dr. Mammie Sears   COLONOSCOPY  02/06/2002   diverticula and internal hemorroids   COLONOSCOPY  2015   COLONOSCOPY  03/2020   diverticulosis, tortuous colon, int hem, rpt 10 yrs ([facility]).   ESOPHAGOGASTRODUODENOSCOPY  03/2020   recurrent large HH with esophagitis with ulceration ([facility])   HIATAL HERNIA REPAIR     LUMBAR LAMINECTOMY     L5-S1    Family History  Problem Relation Age of Onset   Colon polyps Mother    Hypertension Father    Diabetes Father    Heart disease Father    Colon polyps Maternal Grandmother    Heart attack Maternal Grandmother    Heart attack Paternal Grandfather    Diabetes Maternal Aunt    Prostate cancer Maternal Grandfather    Colon cancer Paternal Grandmother    Stomach cancer Neg Hx    Esophageal cancer Neg Hx    Pancreatic cancer Neg Hx    Rectal cancer Neg Hx     Social History   Socioeconomic History   Marital status: Married    Spouse name: Not on file   Number of children: 2   Years of education: Not on file   Highest education level: Associate degree: occupational, Scientist, product/process development, or vocational program  Occupational History   Occupation: Self employeed- Biomedical engineer  Tobacco Use   Smoking status: Former    Current packs/day: 0.00    Types: Cigarettes    Quit date: 1979    Years since quitting: 46.3   Smokeless tobacco: Never   Tobacco comments:    2 years as a teenager  Vaping Use   Vaping status: Never Used   Substance and Sexual Activity   Alcohol use: Not Currently    Alcohol/week: 0.0 standard drinks of alcohol   Drug use: No   Sexual activity: Yes  Other Topics Concern   Not on file  Social History Narrative   Not on file   Social Drivers of Health   Financial Resource Strain: Low Risk  (08/02/2023)  Overall Financial Resource Strain (CARDIA)    Difficulty of Paying Living Expenses: Not hard at all  Food Insecurity: No Food Insecurity (08/02/2023)   Hunger Vital Sign    Worried About Running Out of Food in the Last Year: Never true    Ran Out of Food in the Last Year: Never true  Transportation Needs: No Transportation Needs (08/02/2023)   PRAPARE - Administrator, Civil Service (Medical): No    Lack of Transportation (Non-Medical): No  Physical Activity: Insufficiently Active (08/02/2023)   Exercise Vital Sign    Days of Exercise per Week: 2 days    Minutes of Exercise per Session: 20 min  Stress: No Stress Concern Present (08/02/2023)   [employer] of Occupational Health - Occupational Stress Questionnaire    Feeling of Stress : Not at all  Social Connections: Moderately Integrated (08/02/2023)   Social Connection and Isolation Panel [NHANES]    Frequency of Communication with Friends and Family: Three times a week    Frequency of Social Gatherings with Friends and Family: Three times a week    Attends Religious Services: More than 4 times per year    Active Member of Clubs or Organizations: No    Attends Engineer, structural: Not on file    Marital Status: Married  Catering manager Violence: Not on file   Review of Systems Seems bloated after eating now--is seeing GI about hiatal hernia symptoms No SOB    Objective:   Physical Exam Constitutional:      Appearance: Normal appearance.  Pulmonary:     Effort: Pulmonary effort is normal.     Breath sounds: Normal breath sounds. No wheezing or rales.     Comments: Point tenderness along T11-12 at  anterior axillary line No obvious rib deformity No posterior rib tenderness--even with compression Neurological:     Mental Status: She is alert.            Assessment & Plan:

## 2023-08-02 NOTE — Telephone Encounter (Addendum)
 Appreciate Dr Joelle Musca seeing this pleasant patient today.

## 2023-08-02 NOTE — Telephone Encounter (Signed)
 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

## 2023-08-02 NOTE — Telephone Encounter (Signed)
 FYI for appt today with Dr. Letvak

## 2023-08-23 ENCOUNTER — Other Ambulatory Visit: Payer: Self-pay | Admitting: Family Medicine

## 2023-08-23 NOTE — Telephone Encounter (Unsigned)
 Copied from CRM [id number]. Topic: Clinical - Medication Refill >> Aug 23, 2023 10:58 AM El Gravely T wrote: Medication: Dulaglutide  (TRULICITY ) 3 MG/0.5ML SOPN  Patient requesting 3 month supply  Has the patient contacted their pharmacy? Yes (Agent: If no, request that the patient contact the pharmacy for the refill. If patient does not wish to contact the pharmacy document the reason why and proceed with request.) (Agent: If yes, when and what did the pharmacy advise?)  This is the patient's preferred pharmacy:  [facility] [phone] [facility] [place] - [address] [place] [address] Phone: [phone] Fax: [phone]  Is this the correct pharmacy for this prescription? Yes If no, delete pharmacy and type the correct one.   Has the prescription been filled recently? Yes  Is the patient out of the medication? Yes  Has the patient been seen for an appointment in the last year OR does the patient have an upcoming appointment? Yes  Can we respond through MyChart? Yes  Agent: Please be advised that Rx refills may take up to 3 business days. We ask that you follow-up with your pharmacy.

## 2023-08-24 NOTE — Telephone Encounter (Signed)
 Spoke to pt, scheduled DM f/u for 09/23/23, next available slot for ov. Pt requested for rx to be sent for 90 day suuply. Pt states it'll be cheaper for her if she can receive 90 days instead

## 2023-08-24 NOTE — Telephone Encounter (Signed)
 Trulicity  Last rx:  10/21/22, #6 mL Last OV:  10/20/22, 6 mo DM f/u Next OV:  09/23/23, DM f/u

## 2023-08-24 NOTE — Telephone Encounter (Signed)
 Per 10/20/22 OV notes, pt was to schedule CPE around 02/20/23. Then received MyChart message from Dr Crissie Dome on 07/22/23- which shows read on 07/28/23 (see 07/06/23 pt msg) reminding pt she's due for CPE or at the least DM f/u.   Plz schedule DM f/u, then return request to [facility] for refill.

## 2023-08-26 MED ORDER — TRULICITY 3 MG/0.5ML ~~LOC~~ SOAJ: 3.0000 mg | SUBCUTANEOUS | 2 refills | Status: DC

## 2023-08-26 NOTE — Telephone Encounter (Signed)
 ERx

## 2023-09-20 LAB — BASIC METABOLIC PANEL WITH GFR
BUN: 17 (ref 4–21)
Chloride: 100 (ref 99–108)
Creatinine: 0.7 (ref 0.5–1.1)
Potassium: 4.4 meq/L (ref 3.5–5.1)
Sodium: 139 (ref 137–147)

## 2023-09-20 LAB — LIPID PANEL
Cholesterol: 121 (ref 0–200)
HDL: 53 (ref 35–70)
LDL Cholesterol: 42
Triglycerides: 184 — AB (ref 40–160)

## 2023-09-20 LAB — COMPREHENSIVE METABOLIC PANEL WITH GFR
Albumin: 4.3 (ref 3.5–5.0)
Calcium: 9.8 (ref 8.7–10.7)
eGFR: 92

## 2023-09-20 LAB — HEPATIC FUNCTION PANEL
ALT: 35 U/L (ref 7–35)
AST: 25 (ref 13–35)
Alkaline Phosphatase: 112 (ref 25–125)
Bilirubin, Direct: 0.1 (ref 0.01–0.4)
Bilirubin, Total: 0.3

## 2023-09-20 LAB — HEMOGLOBIN A1C: Hemoglobin A1C: 8.1

## 2023-09-23 ENCOUNTER — Encounter: Payer: Self-pay | Admitting: Family Medicine

## 2023-09-23 ENCOUNTER — Ambulatory Visit: Admitting: Family Medicine

## 2023-09-23 VITALS — BP 118/66 | HR 85 | Temp 98.3°F | Ht 65.5 in | Wt 171.4 lb

## 2023-09-23 DIAGNOSIS — E785 Hyperlipidemia, unspecified: Secondary | ICD-10-CM | POA: Diagnosis not present

## 2023-09-23 DIAGNOSIS — Z794 Long term (current) use of insulin: Secondary | ICD-10-CM | POA: Diagnosis not present

## 2023-09-23 DIAGNOSIS — E1169 Type 2 diabetes mellitus with other specified complication: Secondary | ICD-10-CM | POA: Diagnosis not present

## 2023-09-23 DIAGNOSIS — Z7984 Long term (current) use of oral hypoglycemic drugs: Secondary | ICD-10-CM

## 2023-09-23 DIAGNOSIS — Z7985 Long-term (current) use of injectable non-insulin antidiabetic drugs: Secondary | ICD-10-CM

## 2023-09-23 MED ORDER — INSULIN GLARGINE-YFGN 100 UNIT/ML ~~LOC~~ SOPN: 5.0000 [IU] | PEN_INJECTOR | Freq: Every day | SUBCUTANEOUS | 3 refills | Status: AC

## 2023-09-23 MED ORDER — METFORMIN HCL ER 500 MG PO TB24: ORAL_TABLET | ORAL | 3 refills | Status: AC

## 2023-09-23 MED ORDER — INSULIN PEN NEEDLE 31G X 5 MM MISC: 3 refills | Status: AC

## 2023-09-23 MED ORDER — TRULICITY 3 MG/0.5ML ~~LOC~~ SOAJ: 3.0000 mg | SUBCUTANEOUS | 2 refills | Status: AC

## 2023-09-23 NOTE — Assessment & Plan Note (Addendum)
 Chronic, remains uncontrolled despite 3 drug regimen although seems to be tolerating current regimen well. Latest A1c 8.1%. Hesitant to increase trulicity  dose due to increased risk of side effects. Discussed adjuvant treatment options - she would prefer to try daily basal insulin injections.  Start semglee which seems to be insurance preferred insulin.  Start at 5u daily with slow titration as tolerated/needed by 1 u every 3d if average fasting cbg >130.  Discussed returning for nurse visit for insulin teaching.  If daily insulin started, she will be eligible for insurance coverage for continuous glucose monitoring.  Foot exam today, will request latest diabetic eye exam.  She is not on ACEI/ARB - check Umicroalb/cr next labs.

## 2023-09-23 NOTE — Patient Instructions (Addendum)
 Continue current medicines. Start Semglee (glargine basal insulin daily) 5 units daily. May increase by 1 unit if 3 day average fasting sugar >130.  Let me know if any questions.  Schedule appointment for insulin teaching with nurse or pharmacy up front.  Return in 3 months for diabetes follow up visit

## 2023-09-23 NOTE — Assessment & Plan Note (Signed)
 Chronic, on atorvastatin and zetia.  Latest FLP at goal with LDL 42, non-HDL 68 .

## 2023-09-23 NOTE — Progress Notes (Signed)
 Ph: [phone] Fax: [phone]   Patient ID: Whitney [name], female    DOB: May 28, 1960, 63 y.o.   MRN: [id number]  This visit was conducted in person.  BP 118/66   Pulse 85   Temp 98.3 F (36.8 C) (Oral)   Ht 5' 5.5 (1.664 m)   Wt 171 lb 6 oz (77.7 kg)   LMP 06/17/2011   SpO2 94%   BMI 28.08 kg/m    CC: DM f/u visit  Subjective:   HPI: Whitney [name] is a 63 y.o. female presenting on 09/23/2023 for Medical Management of Chronic Issues (Here for DM f/u. Pt provided copy [to keep] of recent lab results.)   Sees cardiology Dr Levern - h/o post-COVID pericarditis.   Brings labs which were reviewed - TC 121, HDL 53,Trig 184, LDL 42, Cr 0.73 eGFR 92, A1c 8.1, slightly elevated ALT 35, AST 25.   Notes some dizziness, trouble with focus.   T2DM diagnosed 2010 - does regularly check sugars - fasting 130-150s, postprandial 150s. Compliant with antihyperglycemic regimen which includes: metformin  XR 500mg  1 in am and 2 in pm, trulicity  3mg  weekly, jardiance 25mg  daily. Tolerating without UTIs or yeast infection. Doesn't note significant sugar changes despite healthy diet choices. Denies low sugars or hypoglycemic symptoms. Denies blurry vision. + foot paresthesias. Last diabetic eye exam due [facility] 05/2023 - records requested. Glucometer brand: accuchek guide. Last foot exam: 09/2022- update today. DSME: completed 2016, refresher course 2023. Lab Results  Component Value Date   HGBA1C 7.6 (A) 10/20/2022  Latest A1c 8.1% (09/20/2023) through [facility] Diabetic Foot Exam - Simple   Simple Foot Form Diabetic Foot exam was performed with the following findings: Yes 09/23/2023 12:45 PM  Visual Inspection No deformities, no ulcerations, no other skin breakdown bilaterally: Yes Sensation Testing Intact to touch and monofilament testing bilaterally: Yes Pulse Check Posterior Tibialis and Dorsalis pulse intact bilaterally: Yes Comments No claudication symptoms    Lab  Results  Component Value Date   MICROALBUR <0.7 05/29/2019         Relevant past medical, surgical, family and social history reviewed and updated as indicated. Interim medical history since our last visit reviewed. Allergies and medications reviewed and updated. Outpatient Medications Prior to Visit  Medication Sig Dispense Refill   Accu-Chek Softclix Lancets lancets USE AS DIRECTED TO CHECK BLOOD SUGAR ONCE DAILY 100 each 3   APPLE CIDER VINEGAR PO Take by mouth. Gummy, take one by mouth daily     ascorbic acid (VITAMIN C) 500 MG tablet Take 500 mg by mouth 2 (two) times daily.     ASPIRIN PO Take 1 tablet by mouth daily.     atorvastatin (LIPITOR) 40 MG tablet Take 40 mg by mouth daily.     Biotin 89999 MCG TBDP Take by mouth. Take one by mouth daily     Cholecalciferol (VITAMIN D3) 50 MCG (2000 UT) capsule Take 2,000 Units by mouth daily.     Coenzyme Q10 (CO Q10 PO) Take by mouth daily.     colchicine 0.6 MG tablet Take 1 tablet (0.6 mg total) by mouth daily.     Cyanocobalamin (VITAMIN B-12) 1000 MCG SUBL Place under the tongue daily. Liquid     cyclobenzaprine  (FLEXERIL ) 5 MG tablet TAKE 1 TABLET BY MOUTH AT BEDTIME AS NEEDED FOR MUSCLE SPASMS. 30 tablet 1   estradiol (ESTRACE) 0.1 MG/GM vaginal cream Place 1 Applicatorful vaginally 3 (three) times a week.     ezetimibe (  ZETIA) 10 MG tablet Take 10 mg by mouth daily.     fluticasone  (FLONASE ) 50 MCG/ACT nasal spray PLACE 2 SPRAYS INTO BOTH NOSTRILS AS NEEDED FOR ALLERGIES. 48 mL 1   glucose blood (ACCU-CHEK GUIDE) test strip Use as instructed to check blood sugar once daily 100 each 12   JARDIANCE 25 MG TABS tablet Take 25 mg by mouth daily.     magnesium  oxide (MAG-OX) 400 (240 Mg) MG tablet Take 400 mg by mouth daily.     metoprolol succinate (TOPROL-XL) 25 MG 24 hr tablet Take 25 mg by mouth daily.     Multiple Vitamins-Minerals (EQ MULTIVITAMINS ADULT GUMMY PO) Take by mouth. Take two by month daily     Omega-3 Fatty Acids  (FISH OIL) 1200 MG CPDR Take 1,200 mg by mouth daily.     omeprazole  (PRILOSEC) 40 MG capsule Take 1 capsule (40 mg total) by mouth 2 (two) times daily before lunch and supper. 180 capsule 3   triamcinolone  cream (KENALOG ) 0.1 % APPLY 1 APPLICATION TOPICALLY 2 (TWO) TIMES DAILY. 30 g 1   zinc gluconate 50 MG tablet Take 50 mg by mouth daily.     Dulaglutide  (TRULICITY ) 3 MG/0.5ML SOAJ Inject 3 mg as directed once a week. 6 mL 2   metFORMIN  (GLUCOPHAGE -XR) 500 MG 24 hr tablet TAKE 1 TABLET BY MOUTH DAILY WITH BREAKFAST AND 2 TABLETS DAILY WITH SUPPER. 270 tablet 1   No facility-administered medications prior to visit.     Per HPI unless specifically indicated in ROS section below Review of Systems  Objective:  BP 118/66   Pulse 85   Temp 98.3 F (36.8 C) (Oral)   Ht 5' 5.5 (1.664 m)   Wt 171 lb 6 oz (77.7 kg)   LMP 06/17/2011   SpO2 94%   BMI 28.08 kg/m   Wt Readings from Last 3 Encounters:  09/23/23 171 lb 6 oz (77.7 kg)  08/02/23 173 lb (78.5 kg)  06/02/23 171 lb (77.6 kg)      Physical Exam Vitals and nursing note reviewed.  Constitutional:      Appearance: Normal appearance. She is not ill-appearing.  HENT:     Head: Normocephalic and atraumatic.     Mouth/Throat:     Mouth: Mucous membranes are moist.     Pharynx: Oropharynx is clear. No oropharyngeal exudate or posterior oropharyngeal erythema.   Eyes:     Extraocular Movements: Extraocular movements intact.     Conjunctiva/sclera: Conjunctivae normal.     Pupils: Pupils are equal, round, and reactive to light.    Cardiovascular:     Rate and Rhythm: Normal rate and regular rhythm.     Pulses: Normal pulses.     Heart sounds: Normal heart sounds. No murmur heard. Pulmonary:     Effort: Pulmonary effort is normal. No respiratory distress.     Breath sounds: Normal breath sounds. No wheezing, rhonchi or rales.  Abdominal:     General: Abdomen is flat. There is no distension.     Palpations: Abdomen is soft.  There is no mass.     Tenderness: There is no abdominal tenderness. There is no guarding or rebound.     Hernia: No hernia is present.   Musculoskeletal:     Right lower leg: No edema.     Left lower leg: No edema.     Comments:  See HPI for diabetic foot exam if done   Skin:    General: Skin is warm and  dry.     Findings: No rash.   Neurological:     Mental Status: She is alert.   Psychiatric:        Mood and Affect: Mood normal.        Behavior: Behavior normal.       Results for orders placed or performed in visit on 10/20/22  POCT glycosylated hemoglobin (Hb A1C)   Collection Time: 10/20/22  9:40 AM  Result Value Ref Range   Hemoglobin A1C 7.6 (A) 4.0 - 5.6 %   HbA1c POC (<> result, manual entry)     HbA1c, POC (prediabetic range)     HbA1c, POC (controlled diabetic range)    A1c 8.1% (09/20/2023)  Lab Results  Component Value Date   LABMICR <3.0 05/22/2021   MICROALBUR <0.7 05/29/2019   MICROALBUR 1.1 03/31/2018   Assessment & Plan:   Problem List Items Addressed This Visit     Hyperlipidemia associated with type 2 diabetes mellitus (HCC)   Chronic, on atorvastatin and zetia.  Latest FLP at goal with LDL 42, non-HDL 68 .       Relevant Medications   metFORMIN  (GLUCOPHAGE -XR) 500 MG 24 hr tablet   Dulaglutide  (TRULICITY ) 3 MG/0.5ML SOAJ   insulin glargine-yfgn (SEMGLEE) 100 UNIT/ML Pen   Type 2 diabetes mellitus with other specified complication (HCC) - Primary   Chronic, remains uncontrolled despite 3 drug regimen although seems to be tolerating current regimen well. Latest A1c 8.1%. Hesitant to increase trulicity  dose due to increased risk of side effects. Discussed adjuvant treatment options - she would prefer to try daily basal insulin injections.  Start semglee which seems to be insurance preferred insulin.  Start at 5u daily with slow titration as tolerated/needed by 1 u every 3d if average fasting cbg >130.  Discussed returning for nurse visit for  insulin teaching.  If daily insulin started, she will be eligible for insurance coverage for continuous glucose monitoring.  Foot exam today, will request latest diabetic eye exam.  She is not on ACEI/ARB - check Umicroalb/cr next labs.       Relevant Medications   metFORMIN  (GLUCOPHAGE -XR) 500 MG 24 hr tablet   Dulaglutide  (TRULICITY ) 3 MG/0.5ML SOAJ   insulin glargine-yfgn (SEMGLEE) 100 UNIT/ML Pen     Meds ordered this encounter  Medications   metFORMIN  (GLUCOPHAGE -XR) 500 MG 24 hr tablet    Sig: Take 1 tablet (500 mg total) by mouth daily with breakfast AND 2 tablets (1,000 mg total) daily with supper.    Dispense:  270 tablet    Refill:  3   Dulaglutide  (TRULICITY ) 3 MG/0.5ML SOAJ    Sig: Inject 3 mg as directed once a week.    Dispense:  6 mL    Refill:  2   insulin glargine-yfgn (SEMGLEE) 100 UNIT/ML Pen    Sig: Inject 5 Units into the skin daily.    Dispense:  3 mL    Refill:  3   Insulin Pen Needle 31G X 5 MM MISC    Sig: Use as directed to inject Semglee    Dispense:  100 each    Refill:  3    No orders of the defined types were placed in this encounter.   Patient Instructions  Continue current medicines. Start Semglee (glargine basal insulin daily) 5 units daily. May increase by 1 unit if 3 day average fasting sugar >130.  Let me know if any questions.  Schedule appointment for insulin teaching with nurse or pharmacy up  front.  Return in 3 months for diabetes follow up visit   Follow up plan: Return in about 3 months (around 12/24/2023) for follow up visit.  Anton Blas, MD

## 2023-10-12 LAB — HM DIABETES EYE EXAM

## 2023-11-11 ENCOUNTER — Encounter: Payer: Self-pay | Admitting: Oncology

## 2023-11-16 ENCOUNTER — Ambulatory Visit (HOSPITAL_COMMUNITY)
Admission: RE | Admit: 2023-11-16 | Discharge: 2023-11-16 | Disposition: A | Discharge status: Home / Self Care | Attending: Gastroenterology | Admitting: Gastroenterology

## 2023-11-16 ENCOUNTER — Encounter (HOSPITAL_COMMUNITY)
Admission: RE | Disposition: A | Discharge status: Home / Self Care | Payer: Self-pay | Source: Home / Self Care | Attending: Gastroenterology

## 2023-11-16 DIAGNOSIS — K449 Diaphragmatic hernia without obstruction or gangrene: Secondary | ICD-10-CM | POA: Diagnosis present

## 2023-11-16 DIAGNOSIS — R1319 Other dysphagia: Secondary | ICD-10-CM

## 2023-11-16 HISTORY — PX: ESOPHAGEAL MANOMETRY: SHX5429

## 2023-11-16 SURGERY — MANOMETRY, ESOPHAGUS
Anesthesia: Choice

## 2023-11-16 MED ORDER — LIDOCAINE VISCOUS HCL 2 % MT SOLN
OROMUCOSAL | Status: AC
  Filled 2023-11-16: qty 15

## 2023-11-16 SURGICAL SUPPLY — 2 items
FACESHIELD LNG OPTICON STERILE (SAFETY) IMPLANT
GLOVE BIO SURGEON STRL SZ8 (GLOVE) ×2 IMPLANT

## 2023-11-16 NOTE — Progress Notes (Signed)
 Esophageal Manometry done per protocol. Patient tolerated well without distress or complication.

## 2023-11-18 ENCOUNTER — Encounter (HOSPITAL_COMMUNITY): Payer: Self-pay | Admitting: Gastroenterology

## 2023-12-05 ENCOUNTER — Telehealth: Payer: Self-pay | Admitting: Gastroenterology

## 2023-12-05 NOTE — Telephone Encounter (Signed)
 Received a call from patient regarding 8/20 Manometry, Is requesting fu call with nurse to discuss results. Please review and advise   Thank you

## 2023-12-05 NOTE — Telephone Encounter (Signed)
 Dr Shila-  Have you gotten esophageal manometry info on this patient?

## 2023-12-12 NOTE — Telephone Encounter (Signed)
 Whitney [name] - non urgent I know you were covering the hospital last week.  Was curious if you have the report for this patient's manometry? Thanks

## 2023-12-14 NOTE — Telephone Encounter (Signed)
 Thank you Veena, really appreciate it.  Dottie can you please let the patient know and send the manometry report to Dr. Tanda? Thank you

## 2023-12-14 NOTE — Telephone Encounter (Signed)
 Patient has weak ineffective peristalsis likely secondary to chronic GERD and hiatal hernia.  He does have adequate contractile reserve to undergo hiatal hernia repair.

## 2023-12-15 NOTE — Telephone Encounter (Signed)
 I have spoken to patient to advise her of manometry showing weakened, ineffective peristalsis possibly related to chronic reflux and HH. Discussed that she does have adequate contractile reserve though that should allow for successful [facility] repair. Patient verbalizes understanding and states she may hold off on surgery right now but is thinking about it. Advised we will send a copy of manometry report to Dr Tanda for him to have on hand.

## 2023-12-17 ENCOUNTER — Ambulatory Visit: Payer: Self-pay | Admitting: Gastroenterology

## 2023-12-19 ENCOUNTER — Encounter: Payer: Self-pay | Admitting: Family Medicine

## 2023-12-28 ENCOUNTER — Other Ambulatory Visit: Payer: Self-pay | Admitting: Family Medicine

## 2023-12-28 DIAGNOSIS — E1169 Type 2 diabetes mellitus with other specified complication: Secondary | ICD-10-CM

## 2023-12-28 DIAGNOSIS — M51369 Other intervertebral disc degeneration, lumbar region without mention of lumbar back pain or lower extremity pain: Secondary | ICD-10-CM

## 2023-12-28 NOTE — Telephone Encounter (Signed)
 LOV 09/23/23 DM f/u  01/04/23 # 30 w/ 1 refill   NOV none

## 2023-12-29 ENCOUNTER — Ambulatory Visit: Payer: Self-pay

## 2023-12-29 NOTE — Telephone Encounter (Signed)
 FYI Only or Action Required?: FYI only for provider.  Patient was last seen in primary care on 09/23/2023 by Rilla Baller, MD.  Called Nurse Triage reporting Fatigue/weakness.  Symptoms began several months ago.  Interventions attempted: Other: Nitroglycerin x 1 .  Symptoms are: stable.  Triage Disposition: See PCP When Office is Open (Within 3 Days)  Patient/caregiver understands and will follow disposition?: yes        Copied from CRM #[id number]. Topic: Clinical - Red Word Triage >> Dec 29, 2023 12:21 PM Drema MATSU wrote: Reason for Triage: Patient doesn't feel well. She is weak, not sleeping, and light headed. She also stated that she took nitroglycerin.   ----------------------------------------------------------------------- From previous Reason for Contact - Scheduling: Patient/patient representative is calling to schedule an appointment. Refer to attachments for appointment information. Reason for Disposition  [1] MODERATE dizziness (e.g., interferes with normal activities) AND [2] has been evaluated by doctor (or NP/PA) for this  Answer Assessment - Initial Assessment Questions 1. DESCRIPTION: Describe your dizziness.     Weak  2. LIGHTHEADED: Do you feel lightheaded? (e.g., somewhat faint, woozy, weak upon standing)     yes 3. VERTIGO: Do you feel like either you or the room is spinning or tilting? (i.e., vertigo)     no 4. SEVERITY: How bad is it?  Do you feel like you are going to faint? Can you stand and walk?     Yes  5. ONSET:  When did the dizziness begin?     Lightheadedness off and on for weeks  6. AGGRAVATING FACTORS: Does anything make it worse? (e.g., standing, change in head position)     standing 7. HEART RATE: Can you tell me your heart rate? How many beats in 15 seconds?  (Note: Not all patients can do this.)       *No Answer* 8. CAUSE: What do you think is causing the dizziness? (e.g., decreased fluids or food, diarrhea,  emotional distress, heat exposure, new medicine, sudden standing, vomiting; unknown)     *No Answer* 9. RECURRENT SYMPTOM: Have you had dizziness before? If Yes, ask: When was the last time? What happened that time?     *No Answer* 10. OTHER SYMPTOMS: Do you have any other symptoms? (e.g., fever, chest pain, vomiting, diarrhea, bleeding)       Not sleeping well , blood sugar labile  11. PREGNANCY: Is there any chance you are pregnant? When was your last menstrual period?       N/a  Answer Assessment - Initial Assessment Questions 1. LOCATION: Where does it hurt?       Upper central left side  2. RADIATION: Does the pain go anywhere else? (e.g., into neck, jaw, arms, back)     no 3. ONSET: When did the chest pain begin? (Minutes, hours or days)      NTG 15 minutes  4. PATTERN: Does the pain come and go, or has it been constant since it started?  Does it get worse with exertion?      comes 5. DURATION: How long does it last (e.g., seconds, minutes, hours)     30 minutes 6. SEVERITY: How bad is the pain?  (e.g., Scale 1-10; mild, moderate, or severe)     No pressure at present  7. CARDIAC RISK FACTORS: Do you have any history of heart problems or risk factors for heart disease? (e.g., angina, prior heart attack; diabetes, high blood pressure, high cholesterol, smoker, or strong family history of heart disease)     *  No Answer* 8. PULMONARY RISK FACTORS: Do you have any history of lung disease?  (e.g., blood clots in lung, asthma, emphysema, birth control pills)     *No Answer* 9. CAUSE: What do you think is causing the chest pain?     *No Answer* 10. OTHER SYMPTOMS: Do you have any other symptoms? (e.g., dizziness, nausea, vomiting, sweating, fever, difficulty breathing, cough)       Exhausted and tired, brain fog /blurred vision  Protocols used: Dizziness - Lightheadedness-A-AH, Chest Pain-A-AH

## 2023-12-30 ENCOUNTER — Encounter: Payer: Self-pay | Admitting: Family Medicine

## 2023-12-30 ENCOUNTER — Ambulatory Visit: Admitting: Family Medicine

## 2023-12-30 VITALS — BP 122/70 | HR 108 | Temp 97.9°F | Ht 65.5 in | Wt 165.2 lb

## 2023-12-30 DIAGNOSIS — D751 Secondary polycythemia: Secondary | ICD-10-CM

## 2023-12-30 DIAGNOSIS — R0789 Other chest pain: Secondary | ICD-10-CM

## 2023-12-30 DIAGNOSIS — R4189 Other symptoms and signs involving cognitive functions and awareness: Secondary | ICD-10-CM

## 2023-12-30 DIAGNOSIS — R5383 Other fatigue: Secondary | ICD-10-CM

## 2023-12-30 DIAGNOSIS — E611 Iron deficiency: Secondary | ICD-10-CM

## 2023-12-30 DIAGNOSIS — E1169 Type 2 diabetes mellitus with other specified complication: Secondary | ICD-10-CM | POA: Diagnosis not present

## 2023-12-30 DIAGNOSIS — Z794 Long term (current) use of insulin: Secondary | ICD-10-CM

## 2023-12-30 DIAGNOSIS — R42 Dizziness and giddiness: Secondary | ICD-10-CM

## 2023-12-30 LAB — TROPONIN I: Troponin I: <3 ng/L (ref <=47)

## 2023-12-30 NOTE — Progress Notes (Signed)
 Ph: [phone] Fax: [phone]   Patient ID: Whitney [name], female    DOB: 1960/08/06, 63 y.o.   MRN: [id number]  This visit was conducted in person.  BP 122/70   Pulse (!) 108   Temp 97.9 F (36.6 C) (Oral)   Ht 5' 5.5 (1.664 m)   Wt 165 lb 4 oz (75 kg)   LMP 06/17/2011   SpO2 97%   BMI 27.08 kg/m    Chief Complaint  Patient presents with   Fatigue    C/o fatigue, weakness, dizziness and brain fog. Sxs started several wks ago.     Subjective:   Discussed the use of AI scribe software for clinical note transcription with the patient, who gave verbal consent to proceed.  History of Present Illness   Whitney [name] is a 63 year old female with diabetes and history of post-COVID pericarditis who presents with several weeks of fatigue, weakness, dizziness, and brain fog.  She experiences increased dizziness, lightheadedness, and significant brain fog, affecting her ability to perform daily activities. Dizziness occurs with quick movements, presenting as lightheadedness and unsteadiness, without vertigo, room spinning, or syncope. She notes blurry vision and imbalance, again worse with sudden movements.  Her blood sugar levels fluctuate between 117 and over 200. She is on Trulicity , Jardiance, Semglee  insulin , and metformin  but has not been optimally adherent with insulin . She notes she regularly takes insulin  at least every other day but can frequently miss alternate day doses.   She describes infrequent episodes of chest pressure in the mid to left upper chest and recently took nitroglycerin for these symptoms with relief. Her chest discomfort is not exertional or reproducible or relieved with rest. She has not informed her cardiologist about these symptoms. She experiences racing heart palpitations at night.  She reports disrupted sleep, waking multiple times at night, and finds magnesium  ineffective for sleep. She notes recent loose stools, possibly due to magnesium . She  ran out of liquid vitamin B12 supplementation two weeks ago, but symptoms predate this change. She mentions cold feet and numbness to the bottoms of both feet.  No recent colds or respiratory infections. She had a potential tick exposure a month ago with bites on her back but denies rash or fever. No recent cold symptoms, sore throat, or viral infections. She experiences daily headaches to the forehead and to the back of her head, managed with frequent Goody Powders. Denies slurred speech, one-sided tingling, or numbness. No new joint pain or active synovitis noted.      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 Interim medical history since our last visit reviewed. Allergies and medications reviewed and updated. Outpatient Medications Prior to Visit  Medication Sig Dispense Refill   ACCU-CHEK GUIDE TEST test strip USE AS DIRECTED TO CHECK BLOOD SUGAR ONCE DAILY 100 strip 12   Accu-Chek Softclix Lancets lancets USE AS DIRECTED TO CHECK BLOOD SUGAR ONCE DAILY 100 each 3   ASPIRIN PO Take 1 tablet by mouth daily.     atorvastatin (LIPITOR) 40 MG tablet Take 40 mg by mouth daily.     Biotin 89999 MCG TBDP Take by mouth. Take one by mouth daily     Cholecalciferol (VITAMIN D3) 50 MCG (2000 UT) capsule Take 2,000 Units by mouth daily.     Coenzyme Q10 (CO Q10 PO) Take by mouth daily.     colchicine 0.6 MG tablet Take  1 tablet (0.6 mg total) by mouth daily.     Cyanocobalamin (VITAMIN B-12) 1000 MCG SUBL Place under the tongue daily. Liquid     cyclobenzaprine  (FLEXERIL ) 5 MG tablet TAKE 1 TABLET BY MOUTH AT BEDTIME AS NEEDED FOR MUSCLE SPASMS. 30 tablet 1   Dulaglutide  (TRULICITY ) 3 MG/0.5ML SOAJ Inject 3 mg as directed once a week. 6 mL 2   estradiol (ESTRACE) 0.1 MG/GM vaginal cream Place 1 Applicatorful vaginally 3 (three) times a week.     ezetimibe (ZETIA) 10 MG tablet Take 10 mg by mouth daily.      fluticasone  (FLONASE ) 50 MCG/ACT nasal spray PLACE 2 SPRAYS INTO BOTH NOSTRILS AS NEEDED FOR ALLERGIES. 48 mL 1   insulin  glargine-yfgn (SEMGLEE ) 100 UNIT/ML Pen Inject 5 Units into the skin daily. 3 mL 3   Insulin  Pen Needle 31G X 5 MM MISC Use as directed to inject Semglee  100 each 3   JARDIANCE 25 MG TABS tablet Take 25 mg by mouth daily.     magnesium  oxide (MAG-OX) 400 (240 Mg) MG tablet Take 400 mg by mouth daily.     metFORMIN  (GLUCOPHAGE -XR) 500 MG 24 hr tablet Take 1 tablet (500 mg total) by mouth daily with breakfast AND 2 tablets (1,000 mg total) daily with supper. 270 tablet 3   metoprolol succinate (TOPROL-XL) 25 MG 24 hr tablet Take 25 mg by mouth daily.     Multiple Vitamins-Minerals (EQ MULTIVITAMINS ADULT GUMMY PO) Take by mouth. Take two by month daily     omeprazole  (PRILOSEC) 40 MG capsule Take 1 capsule (40 mg total) by mouth 2 (two) times daily before lunch and supper. 180 capsule 3   triamcinolone  cream (KENALOG ) 0.1 % APPLY 1 APPLICATION TOPICALLY 2 (TWO) TIMES DAILY. 30 g 1   zinc gluconate 50 MG tablet Take 50 mg by mouth daily.     APPLE CIDER VINEGAR PO Take by mouth. Gummy, take one by mouth daily     ascorbic acid (VITAMIN C) 500 MG tablet Take 500 mg by mouth 2 (two) times daily.     Omega-3 Fatty Acids (FISH OIL) 1200 MG CPDR Take 1,200 mg by mouth daily.     No facility-administered medications prior to visit.    Family History  Problem Relation Age of Onset   Colon polyps Mother    Hypertension Father    Diabetes Father    Heart disease Father    Brain cancer Father 55       Glioblastoma   Colon polyps Maternal Grandmother    Heart attack Maternal Grandmother    Prostate cancer Maternal Grandfather    Colon cancer Paternal Grandmother    Heart attack Paternal Grandfather    Diabetes Maternal Aunt    Stomach cancer Neg Hx    Esophageal cancer Neg Hx    Pancreatic cancer Neg Hx    Rectal cancer Neg Hx    Per HPI unless specifically indicated in ROS  section below Review of Systems  Objective:  BP 122/70   Pulse (!) 108   Temp 97.9 F (36.6 C) (Oral)   Ht 5' 5.5 (1.664 m)   Wt 165 lb 4 oz (75 kg)   LMP 06/17/2011   SpO2 97%   BMI 27.08 kg/m   Wt Readings from Last 3 Encounters:  12/30/23 165 lb 4 oz (75 kg)  09/23/23 171 lb 6 oz (77.7 kg)  08/02/23 173 lb (78.5 kg)      Physical Exam Vitals and nursing  note reviewed.  Constitutional:      Appearance: Normal appearance. She is not ill-appearing.  HENT:     Head: Normocephalic and atraumatic.     Mouth/Throat:     Mouth: Mucous membranes are moist.     Pharynx: Oropharynx is clear. No oropharyngeal exudate or posterior oropharyngeal erythema.  Eyes:     Extraocular Movements: Extraocular movements intact.     Conjunctiva/sclera: Conjunctivae normal.     Pupils: Pupils are equal, round, and reactive to light.  Neck:     Thyroid : No thyroid  mass or thyromegaly.  Cardiovascular:     Rate and Rhythm: Regular rhythm. Tachycardia present.     Pulses: Normal pulses.     Heart sounds: Normal heart sounds. No murmur heard. Pulmonary:     Effort: Pulmonary effort is normal. No respiratory distress.     Breath sounds: Normal breath sounds. No wheezing, rhonchi or rales.  Abdominal:     General: Bowel sounds are normal. There is no distension.     Palpations: Abdomen is soft. There is no mass.     Tenderness: There is no abdominal tenderness. There is no guarding or rebound.     Hernia: No hernia is present.  Musculoskeletal:     Cervical back: Normal range of motion and neck supple.     Right lower leg: No edema.     Left lower leg: No edema.  Skin:    General: Skin is warm and dry.     Findings: No rash.  Neurological:     Mental Status: She is alert.     Cranial Nerves: Cranial nerves 2-12 are intact.     Sensory: Sensation is intact.     Motor: Motor function is intact.     Coordination: Coordination is intact. Romberg sign negative. Coordination normal.  Finger-Nose-Finger Test normal.     Gait: Gait is intact.     Comments:  CN 2-12 intact FTN intact EOMI  Psychiatric:        Mood and Affect: Mood normal.        Behavior: Behavior normal.    Diabetic Foot Exam - Simple   Simple Foot Form Diabetic Foot exam was performed with the following findings: Yes 12/30/2023  4:33 PM  Visual Inspection No deformities, no ulcerations, no other skin breakdown bilaterally: Yes Sensation Testing Intact to touch and monofilament testing bilaterally: Yes Pulse Check Posterior Tibialis and Dorsalis pulse intact bilaterally: Yes Comments No claudication 2+ DP bilaterally Sensation intact to monofilament testing        Results           Results for orders placed or performed in visit on 11/22/23  HM DIABETES EYE EXAM   Collection Time: 10/12/23  8:46 AM  Result Value Ref Range   HM Diabetic Eye Exam No Retinopathy No Retinopathy   EKG - sinus tachycardia rate 100s, LAD, normal intervals, nonspecific ST flattening inferiorly and septally   Assessment & Plan:      Fatigue, weakness, dizziness, and brain fog Symptoms affecting daily activities, more pronounced in the morning but present throughout the day. Lightheadedness with quick movements, history of vertigo but no recent episodes. Blurry vision noted. She is concern about these symptoms due to father's glioblastoma history. Stress from work noted. - Order blood work including vitamin levels. - Encouraged try to reduce work-related stress.  Chest pressure Intermittent non-sharp chest pressure, more noticeable under stress. Took nitroglycerin for relief. Follow-up with cardiologist pending. Overall atypical description of  chest discomfort.  - Order stat troponin - if elevated, will refer to ER  - Order EKG.  Type 2 diabetes mellitus Diagnosed in 2010. Blood sugar levels fluctuating. Not consistently taking insulin . - Reinforce adherence to insulin  regimen - rec daily 5 units,  holding if hypoglycemia develops. - Discussed option to titration basal insulin  according to average fasting sugars.  - Check hemoglobin A1c with blood work today.  Peripheral neuropathy symptoms Numbness and cold feelings in feet, consistent with peripheral neuropathy, possibly related to diabetes. - Monitor symptoms and adjust diabetes management as needed.  Chronic daily headaches Chronic daily headaches primarily in the forehead, has been managing with Goody powders on average every other day. Discussed possible Medication Overuse Headache [facility]). - Recommend limit Goody Powders - May use flexeril  as needed for headaches.   Sleep disturbance/insomnia Significant sleep disturbance with difficulty staying asleep. Magnesium  for sleep ineffective and may cause loose stools. - Discontinue magnesium  supplementation.  Possible tick exposure Possible tick exposure a month ago with subsequent symptoms. No new rash or fever reported. - Check Lyme disease titers.      Problem List Items Addressed This Visit     Type 2 diabetes mellitus with other specified complication (HCC)   Relevant Orders   Comprehensive metabolic panel with GFR   Hemoglobin A1c   Iron  deficiency   Fatigue   Relevant Orders   CBC with Differential/Platelet   TSH   Vitamin B12   VITAMIN D  25 Hydroxy (Vit-D Deficiency, Fractures)   Sedimentation rate   B. burgdorfi antibodies by WB   Polycythemia   Relevant Orders   CBC with Differential/Platelet   Other Visit Diagnoses       Brain fog    -  Primary     Dizziness         Chest discomfort       Relevant Orders   EKG 12-Lead   Troponin I        No orders of the defined types were placed in this encounter.   Orders Placed This Encounter  Procedures   CBC with Differential/Platelet   Comprehensive metabolic panel with GFR   TSH   Vitamin B12   VITAMIN D  25 Hydroxy (Vit-D Deficiency, Fractures)   Hemoglobin A1c   Sedimentation rate   B. burgdorfi  antibodies by WB   Troponin I   EKG 12-Lead    Patient Instructions  VISIT SUMMARY: Today, we discussed your ongoing symptoms of fatigue, weakness, dizziness, and brain fog, which have been affecting your daily activities. We also reviewed your chest pressure, diabetes management, peripheral neuropathy symptoms, chronic headaches, sleep disturbances, recent diarrhea, and possible tick exposure.  YOUR PLAN: -FATIGUE, WEAKNESS, DIZZINESS, AND BRAIN FOG: These symptoms are affecting your daily activities. We will order blood work to check your vitamin levels and heart enzymes. If your symptoms persist, we may consider head imaging. Work on reducing your work-related stress.  -CHEST PRESSURE: You have been experiencing intermittent chest pressure, especially under stress. We will order an EKG to check your heart's electrical activity. Please review these symptoms with your cardiologist at your upcoming appointment.  -TYPE 2 DIABETES MELLITUS: Your blood sugar levels have been fluctuating, and you have not been consistently taking your insulin . We will check your hemoglobin A1c with your blood work today.  -PERIPHERAL NEUROPATHY SYMPTOMS: You have been experiencing numbness and cold feelings in your feet, which may be related to your diabetes. We will monitor these symptoms.   -SLEEP DISTURBANCE/INSOMNIA:  You have been having significant sleep disturbances and difficulty staying asleep. Magnesium  has been ineffective and may be causing loose stools, so we will discontinue it.  -RECENT DIARRHEA: You have had recent diarrhea, possibly related to magnesium  supplementation. We will discontinue the magnesium  to see if your symptoms improve.  -POSSIBLE TICK EXPOSURE: You had a possible tick exposure a month ago but have not developed a rash or fever. We will check for any tick-borne diseases with blood tests.  INSTRUCTIONS: Please follow up with your cardiologist for chest pressure symptoms. If  worsening chest discomfort, seek care at ER. We will also need to review your blood work results once they are available. I have ordered a heart enzyme today as well as ottre labwork. If your symptoms of dizziness and brain fog persist, we may need to consider head imaging. Continue to monitor your blood sugar levels and adhere to your insulin  regimen as prescribed.  Follow up plan: Return if symptoms worsen or fail to improve.  Anton Blas, MD

## 2023-12-30 NOTE — Telephone Encounter (Signed)
Will see at OV 

## 2023-12-30 NOTE — Patient Instructions (Addendum)
 VISIT SUMMARY: Today, we discussed your ongoing symptoms of fatigue, weakness, dizziness, and brain fog, which have been affecting your daily activities. We also reviewed your chest pressure, diabetes management, peripheral neuropathy symptoms, chronic headaches, sleep disturbances, recent diarrhea, and possible tick exposure.  YOUR PLAN: -FATIGUE, WEAKNESS, DIZZINESS, AND BRAIN FOG: These symptoms are affecting your daily activities. We will order blood work to check your vitamin levels and heart enzymes. If your symptoms persist, we may consider head imaging. Work on reducing your work-related stress.  -CHEST PRESSURE: You have been experiencing intermittent chest pressure, especially under stress. We will order an EKG to check your heart's electrical activity. Please review these symptoms with your cardiologist at your upcoming appointment.  -TYPE 2 DIABETES MELLITUS: Your blood sugar levels have been fluctuating, and you have not been consistently taking your insulin . We will check your hemoglobin A1c with your blood work today.  -PERIPHERAL NEUROPATHY SYMPTOMS: You have been experiencing numbness and cold feelings in your feet, which may be related to your diabetes. We will monitor these symptoms.   -SLEEP DISTURBANCE/INSOMNIA: You have been having significant sleep disturbances and difficulty staying asleep. Magnesium  has been ineffective and may be causing loose stools, so we will discontinue it.  -RECENT DIARRHEA: You have had recent diarrhea, possibly related to magnesium  supplementation. We will discontinue the magnesium  to see if your symptoms improve.  -POSSIBLE TICK EXPOSURE: You had a possible tick exposure a month ago but have not developed a rash or fever. We will check for any tick-borne diseases with blood tests.  INSTRUCTIONS: Please follow up with your cardiologist for chest pressure symptoms. If worsening chest discomfort, seek care at ER. We will also need to review your  blood work results once they are available. I have ordered a heart enzyme today as well as ottre labwork. If your symptoms of dizziness and brain fog persist, we may need to consider head imaging. Continue to monitor your blood sugar levels and adhere to your insulin  regimen as prescribed.

## 2024-01-02 ENCOUNTER — Other Ambulatory Visit (HOSPITAL_COMMUNITY): Payer: Self-pay | Admitting: Cardiology

## 2024-01-02 ENCOUNTER — Encounter: Payer: Self-pay | Admitting: Family Medicine

## 2024-01-02 DIAGNOSIS — R079 Chest pain, unspecified: Secondary | ICD-10-CM

## 2024-01-04 LAB — CBC WITH DIFFERENTIAL/PLATELET
Absolute Lymphocytes: 2507 {cells}/uL (ref 850–3900)
Absolute Monocytes: 828 {cells}/uL (ref 200–950)
Basophils Absolute: 58 {cells}/uL (ref 0–200)
Basophils Relative: 0.5 %
Eosinophils Absolute: 115 {cells}/uL (ref 15–500)
Eosinophils Relative: 1 %
HCT: 52.1 % — ABNORMAL HIGH (ref 35.0–45.0)
Hemoglobin: 17.6 g/dL — ABNORMAL HIGH (ref 11.7–15.5)
MCH: 30.8 pg (ref 27.0–33.0)
MCHC: 33.8 g/dL (ref 32.0–36.0)
MCV: 91.2 fL (ref 80.0–100.0)
MPV: 9.4 fL (ref 7.5–12.5)
Monocytes Relative: 7.2 %
Neutro Abs: 7993 {cells}/uL — ABNORMAL HIGH (ref 1500–7800)
Neutrophils Relative %: 69.5 %
Platelets: 399 Thousand/uL (ref 140–400)
RBC: 5.71 Million/uL — ABNORMAL HIGH (ref 3.80–5.10)
RDW: 12.7 % (ref 11.0–15.0)
Total Lymphocyte: 21.8 %
WBC: 11.5 Thousand/uL — ABNORMAL HIGH (ref 3.8–10.8)

## 2024-01-04 LAB — COMPREHENSIVE METABOLIC PANEL WITH GFR
AG Ratio: 1.8 (calc) (ref 1.0–2.5)
ALT: 31 U/L — ABNORMAL HIGH (ref 6–29)
AST: 19 U/L (ref 10–35)
Albumin: 4.6 g/dL (ref 3.6–5.1)
Alkaline phosphatase (APISO): 96 U/L (ref 37–153)
BUN: 17 mg/dL (ref 7–25)
CO2: 25 mmol/L (ref 20–32)
Calcium: 10 mg/dL (ref 8.6–10.4)
Chloride: 100 mmol/L (ref 98–110)
Creat: 0.87 mg/dL (ref 0.50–1.05)
Globulin: 2.5 g/dL (ref 1.9–3.7)
Glucose, Bld: 121 mg/dL — ABNORMAL HIGH (ref 65–99)
Potassium: 4.4 mmol/L (ref 3.5–5.3)
Sodium: 138 mmol/L (ref 135–146)
Total Bilirubin: 0.5 mg/dL (ref 0.2–1.2)
Total Protein: 7.1 g/dL (ref 6.1–8.1)
eGFR: 74 mL/min/1.73m2 (ref >=60)

## 2024-01-04 LAB — B. BURGDORFI ANTIBODIES BY WB

## 2024-01-04 LAB — TSH: TSH: 1.82 m[IU]/L (ref 0.40–4.50)

## 2024-01-04 LAB — HEMOGLOBIN A1C
Hgb A1c MFr Bld: 7.8 % — ABNORMAL HIGH (ref <5.7)
Mean Plasma Glucose: 177 mg/dL
eAG (mmol/L): 9.8 mmol/L

## 2024-01-04 LAB — VITAMIN D 25 HYDROXY (VIT D DEFICIENCY, FRACTURES): Vit D, 25-Hydroxy: 38 ng/mL (ref 30–100)

## 2024-01-04 LAB — VITAMIN B12: Vitamin B-12: 496 pg/mL (ref 200–1100)

## 2024-01-04 LAB — SEDIMENTATION RATE: Sed Rate: 2 mm/h (ref 0–30)

## 2024-01-09 ENCOUNTER — Ambulatory Visit: Payer: Self-pay | Admitting: Family Medicine

## 2024-01-11 ENCOUNTER — Encounter (HOSPITAL_COMMUNITY): Payer: Self-pay

## 2024-01-11 ENCOUNTER — Encounter (HOSPITAL_COMMUNITY): Admission: RE | Admit: 2024-01-11 | Source: Ambulatory Visit

## 2024-01-23 ENCOUNTER — Ambulatory Visit: Payer: Self-pay

## 2024-01-23 ENCOUNTER — Ambulatory Visit
Admission: EM | Admit: 2024-01-23 | Discharge: 2024-01-23 | Disposition: A | Discharge status: Home / Self Care | Attending: Internal Medicine | Admitting: Internal Medicine

## 2024-01-23 ENCOUNTER — Encounter: Payer: Self-pay | Admitting: *Deleted

## 2024-01-23 ENCOUNTER — Encounter (HOSPITAL_COMMUNITY): Payer: Self-pay

## 2024-01-23 ENCOUNTER — Ambulatory Visit (INDEPENDENT_AMBULATORY_CARE_PROVIDER_SITE_OTHER)

## 2024-01-23 DIAGNOSIS — R0789 Other chest pain: Secondary | ICD-10-CM | POA: Diagnosis not present

## 2024-01-23 MED ORDER — CYCLOBENZAPRINE HCL 5 MG PO TABS: 5.0000 mg | ORAL_TABLET | Freq: Three times a day (TID) | ORAL | 0 refills | Status: DC | PRN

## 2024-01-23 NOTE — ED Provider Notes (Signed)
 [facility]    CSN: [id number] Arrival date & time: 01/23/24  1853      History   Chief Complaint Chief Complaint  Patient presents with   Fall    HPI Whitney [name] is a 63 y.o. female.   63 year old female who presents to urgent care with complaints of right rib pain.  She fell on Monday last week and landed on her knee as well as her right side.  She is not having problems with her knee but reports that she is continuing to have right side pain.  The pain is worse with deep breathing as well as coughing.  The pain is located around the level of the breast on the side and up through the superior aspect.  She denies any shortness of breath.  She has been taking Tylenol with little relief.  She denies any other injury.   Fall Pertinent negatives include no chest pain, no abdominal pain and no shortness of breath.    Past Medical History:  Diagnosis Date   Allergy    Seasonal   Anxiety    Attention deficit disorder without mention of hyperactivity    Barrett's esophagus    Cancer (HCC)    skin cancer on nose   CHOLELITHIASIS 05/26/2007   Qualifier: Diagnosis of  By: Baird FNP, Billie-Lynn Daniels    Congenital anomalies of foot, not elsewhere classified    CONSTIPATION 10/09/2008   Qualifier: Diagnosis of  By: Baird FNP, Frieda Kipper    Depression    Diabetes mellitus without complication (HCC)    Displacement of intervertebral disc, site unspecified, without myelopathy    DIVERTICULAR DISEASE 02/06/2002   Qualifier: Diagnosis of  By: Baird FNP, Billie-Lynn Daniels    Diverticulosis    Elevated blood pressure reading without diagnosis of hypertension    Esophageal reflux    Gallstones    Gastroenteritis 07/27/2016   Hiatal hernia    HIATAL HERNIA 10/16/2008   Qualifier: Diagnosis of  By: Baird FNP, Frieda Kipper    HLD (hyperlipidemia)    Hypoglycemia, unspecified    Internal hemorrhoids without mention of complication    Other abnormal  blood chemistry     Patient Active Problem List   Diagnosis Date Noted   Urinary tract infection due to ESBL Klebsiella 08/27/2022   Polycythemia 04/13/2022   Situational anxiety 10/30/2021   Viral pericarditis 05/22/2021   Post-COVID-19 syndrome 05/22/2021   DDD (degenerative disc disease), lumbar 10/19/2019   Overweight (BMI 25.0-29.9) 09/15/2019   History of Barrett's esophagus 09/15/2019   Frequent headaches 10/14/2015   Fatigue 05/05/2015   Iron  deficiency 12/30/2014   Type 2 diabetes mellitus with other specified complication (HCC) 10/15/2014   Microcytic anemia 10/15/2014   Health maintenance examination 10/08/2014   Hyperlipidemia associated with type 2 diabetes mellitus (HCC) 10/09/2008   GERD (gastroesophageal reflux disease) 09/27/2006    Past Surgical History:  Procedure Laterality Date   BREAST CYST EXCISION Left    CARDIAC CATHETERIZATION     [facility]   CESAREAN SECTION     CHOLECYSTECTOMY  06/16/2007   Dr. Kimble   COLONOSCOPY  02/06/2002   diverticula and internal hemorroids   COLONOSCOPY  2015   COLONOSCOPY  03/2020   diverticulosis, tortuous colon, int hem, rpt 10 yrs (Armbruster).   ESOPHAGEAL MANOMETRY N/A 11/16/2023   normal relaxation of EG junction, ineffective seophageal motility with poor bolus clearance likely 2/2 HH, adequate contractile reserve (Armbruster, Elspeth SQUIBB, MD)   ESOPHAGOGASTRODUODENOSCOPY  03/2020  recurrent large HH with esophagitis with ulceration (Armbruster)   HIATAL HERNIA REPAIR     LUMBAR LAMINECTOMY     L5-S1    OB History   No obstetric history on file.      Home Medications    Prior to Admission medications   Medication Sig Start Date End Date Taking? Authorizing Provider  ASPIRIN PO Take 1 tablet by mouth daily.   Yes [provider]  atorvastatin (LIPITOR) 40 MG tablet Take 40 mg by mouth daily.   Yes [provider]  Biotin 89999 MCG TBDP Take by mouth. Take one by mouth daily   Yes [provider]  Cholecalciferol (VITAMIN D3) 50 MCG (2000 UT) capsule Take 2,000 Units by mouth daily.   Yes [provider]  colchicine 0.6 MG tablet Take 1 tablet (0.6 mg total) by mouth daily. 05/22/21  Yes Rilla Baller, MD  cyclobenzaprine  (FLEXERIL ) 5 MG tablet TAKE 1 TABLET BY MOUTH AT BEDTIME AS NEEDED FOR MUSCLE SPASMS. 12/29/23  Yes Rilla Baller, MD  cyclobenzaprine  (FLEXERIL ) 5 MG tablet Take 1 tablet (5 mg total) by mouth every 8 (eight) hours as needed for muscle spasms. 01/23/24  Yes Quanell Loughney A, PA-C  Dulaglutide  (TRULICITY ) 3 MG/0.5ML SOAJ Inject 3 mg as directed once a week. 09/23/23  Yes Rilla Baller, MD  estradiol (ESTRACE) 0.1 MG/GM vaginal cream Place 1 Applicatorful vaginally 3 (three) times a week. 01/18/23  Yes [provider]  ezetimibe (ZETIA) 10 MG tablet Take 10 mg by mouth daily. 05/01/21  Yes [provider]  fluticasone  (FLONASE ) 50 MCG/ACT nasal spray PLACE 2 SPRAYS INTO BOTH NOSTRILS AS NEEDED FOR ALLERGIES. 12/07/19 01/23/24 Yes Moishe Suzen LABOR, NP  insulin  glargine-yfgn (SEMGLEE ) 100 UNIT/ML Pen Inject 5 Units into the skin daily. 09/23/23  Yes Rilla Baller, MD  JARDIANCE 25 MG TABS tablet Take 25 mg by mouth daily. 03/11/21  Yes [provider]  metFORMIN  (GLUCOPHAGE -XR) 500 MG 24 hr tablet Take 1 tablet (500 mg total) by mouth daily with breakfast AND 2 tablets (1,000 mg total) daily with supper. 09/23/23  Yes Rilla Baller, MD  metoprolol succinate (TOPROL-XL) 25 MG 24 hr tablet Take 25 mg by mouth daily.   Yes [provider]  omeprazole  (PRILOSEC) 40 MG capsule Take 1 capsule (40 mg total) by mouth 2 (two) times daily before lunch and supper. 06/02/23  Yes Armbruster, Elspeth SQUIBB, MD  ACCU-CHEK GUIDE TEST test strip USE AS DIRECTED TO CHECK BLOOD SUGAR ONCE DAILY 12/28/23   Rilla Baller, MD  Accu-Chek Softclix Lancets lancets USE AS DIRECTED TO CHECK BLOOD SUGAR ONCE DAILY 08/18/22   Rilla Baller, MD  Coenzyme Q10 (CO Q10 PO) Take by mouth daily.    [provider]  Cyanocobalamin (VITAMIN B-12) 1000 MCG SUBL Place under the tongue daily. Liquid    [provider]  Insulin  Pen Needle 31G X 5 MM MISC Use as directed to inject Semglee  09/23/23   Rilla Baller, MD  magnesium  oxide (MAG-OX) 400 (240 Mg) MG tablet Take 400 mg by mouth daily.    [provider]  Multiple Vitamins-Minerals (EQ MULTIVITAMINS ADULT GUMMY PO) Take by mouth. Take two by month daily    [provider]  triamcinolone  cream (KENALOG ) 0.1 % APPLY 1 APPLICATION TOPICALLY 2 (TWO) TIMES DAILY. 04/26/17   Aron, Talia M, MD  zinc gluconate 50 MG tablet Take 50 mg by mouth daily.    [provider]    Family History  Family History  Problem Relation Age of Onset   Colon polyps Mother    Hypertension Father    Diabetes Father    Heart disease Father    Brain cancer Father 14       Glioblastoma   Colon polyps Maternal Grandmother    Heart attack Maternal Grandmother    Prostate cancer Maternal Grandfather    Colon cancer Paternal Grandmother    Heart attack Paternal Grandfather    Diabetes Maternal Aunt    Stomach cancer Neg Hx    Esophageal cancer Neg Hx    Pancreatic cancer Neg Hx    Rectal cancer Neg Hx     Social History Social History   Tobacco Use   Smoking status: Former    Current packs/day: 0.00    Types: Cigarettes    Quit date: 1979    Years since quitting: 46.8   Smokeless tobacco: Never   Tobacco comments:    2 years as a teenager  Vaping Use   Vaping status: Never Used  Substance Use Topics   Alcohol use: Not Currently    Alcohol/week: 0.0 standard drinks of alcohol   Drug use: No     Allergies   Sertraline hcl   Review of Systems Review of Systems  Constitutional:  Negative for chills and fever.  HENT:  Negative for ear pain and sore throat.   Eyes:  Negative for pain and visual disturbance.  Respiratory:  Negative for  cough and shortness of breath.   Cardiovascular:  Negative for chest pain and palpitations.  Gastrointestinal:  Negative for abdominal pain and vomiting.  Genitourinary:  Negative for dysuria and hematuria.  Musculoskeletal:  Negative for arthralgias and back pain.       Right rib pain  Skin:  Negative for color change and rash.  Neurological:  Negative for seizures and syncope.  All other systems reviewed and are negative.    Physical Exam Triage Vital Signs ED Triage Vitals  Encounter Vitals Group     BP 01/23/24 1921 112/68     Girls Systolic BP Percentile --      Girls Diastolic BP Percentile --      Boys Systolic BP Percentile --      Boys Diastolic BP Percentile --      Pulse Rate 01/23/24 1921 86     Resp 01/23/24 1921 18     Temp 01/23/24 1921 98.7 F (37.1 C)     Temp Source 01/23/24 1921 Oral     SpO2 01/23/24 1921 97 %     Weight --      Height --      Head Circumference --      Peak Flow --      Pain Score 01/23/24 1918 8     Pain Loc --      Pain Education --      Exclude from Growth Chart --    No data found.  Updated Vital Signs BP 112/68 (BP Location: Left Arm)   Pulse 86   Temp 98.7 F (37.1 C) (Oral)   Resp 18   LMP 06/17/2011   SpO2 97%   Visual Acuity Right Eye Distance:   Left Eye Distance:   Bilateral Distance:    Right Eye Near:   Left Eye Near:    Bilateral Near:     Physical Exam Vitals and nursing note reviewed.  Constitutional:      General: She is not in acute distress.  Appearance: She is well-developed.  HENT:     Head: Normocephalic and atraumatic.  Eyes:     Conjunctiva/sclera: Conjunctivae normal.  Cardiovascular:     Rate and Rhythm: Normal rate and regular rhythm.     Heart sounds: No murmur heard. Pulmonary:     Effort: Pulmonary effort is normal. No tachypnea or respiratory distress.     Breath sounds: Normal breath sounds. No decreased breath sounds or wheezing.  Chest:    Abdominal:     Palpations:  Abdomen is soft.     Tenderness: There is no abdominal tenderness.  Musculoskeletal:        General: No swelling.     Cervical back: Neck supple.  Skin:    General: Skin is warm and dry.     Capillary Refill: Capillary refill takes less than 2 seconds.  Neurological:     Mental Status: She is alert.  Psychiatric:        Mood and Affect: Mood normal.      UC Treatments / Results  Labs (all labs ordered are listed, but only abnormal results are displayed) Labs Reviewed - No data to display  EKG   Radiology No results found.  Procedures Procedures (including critical care time)  Medications Ordered in UC Medications - No data to display  Initial Impression / Assessment and Plan / UC Course  I have reviewed the triage vital signs and the nursing notes.  Pertinent labs & imaging results that were available during my care of the patient were reviewed by me and considered in my medical decision making (see chart for details).     Rib pain on right side - Plan: DG Ribs Unilateral W/Chest Right, DG Ribs Unilateral W/Chest Right   X-ray of the chest and right ribs done today.  Final evaluation by the radiologist is still pending but on brief evaluation there does not appear to be any displaced rib fractures.  The lung evaluation appears clear as well.  Once the radiologist has finalized the report if there is any change in the treatment plan we will contact you.  Injuries to the ribs can be very painful and take several weeks to completely resolve.  Can try the following: Ice the area 2-3 times daily for 10-15 minutes to help with pain and swelling. Do not apply ice directly to the skin.  May alternate ice with heat for comfort Continue Tylenol as needed for pain Flexeril  5 mg every 12 hours as needed for muscle spasms.  Use caution as this medication can cause drowsiness.  Recommend continuing deep breathing exercises to prevent fluid buildup in the lungs Return to urgent care  or PCP if symptoms worsen or fail to resolve.   Final Clinical Impressions(s) / UC Diagnoses   Final diagnoses:  Rib pain on right side     Discharge Instructions      X-ray of the chest and right ribs done today.  Final evaluation by the radiologist is still pending but on brief evaluation there does not appear to be any displaced rib fractures.  The lung evaluation appears clear as well.  Once the radiologist has finalized the report if there is any change in the treatment plan we will contact you.  Injuries to the ribs can be very painful and take several weeks to completely resolve.  Can try the following: Ice the area 2-3 times daily for 10-15 minutes to help with pain and swelling. Do not apply ice directly to the skin.  May alternate ice with heat for comfort Continue Tylenol as needed for pain Flexeril  5 mg every 12 hours as needed for muscle spasms.  Use caution as this medication can cause drowsiness.  Recommend continuing deep breathing exercises to prevent fluid buildup in the lungs Return to urgent care or PCP if symptoms worsen or fail to resolve.       ED Prescriptions     Medication Sig Dispense Auth. Provider   cyclobenzaprine  (FLEXERIL ) 5 MG tablet Take 1 tablet (5 mg total) by mouth every 8 (eight) hours as needed for muscle spasms. 20 tablet Teresa Almarie LABOR, [place]      PDMP not reviewed this encounter.   Teresa Almarie LABOR [place] 01/23/24 2004

## 2024-01-23 NOTE — Discharge Instructions (Addendum)
 X-ray of the chest and right ribs done today.  Final evaluation by the radiologist is still pending but on brief evaluation there does not appear to be any displaced rib fractures.  The lung evaluation appears clear as well.  Once the radiologist has finalized the report if there is any change in the treatment plan we will contact you.  Injuries to the ribs can be very painful and take several weeks to completely resolve.  Can try the following: Ice the area 2-3 times daily for 10-15 minutes to help with pain and swelling. Do not apply ice directly to the skin.  May alternate ice with heat for comfort Continue Tylenol as needed for pain Flexeril  5 mg every 12 hours as needed for muscle spasms.  Use caution as this medication can cause drowsiness.  Recommend continuing deep breathing exercises to prevent fluid buildup in the lungs Return to urgent care or PCP if symptoms worsen or fail to resolve.

## 2024-01-23 NOTE — Telephone Encounter (Signed)
 Agree with UCC recommendation. Plz call tomorrow for update on symptoms.

## 2024-01-23 NOTE — ED Triage Notes (Signed)
 One week ago pt states she tripped and fell and landed on her right knee then landed on her right side full force. C/o continued pain in right ribs, worse with coughing and taking a deep breath. She has been taking tylenol

## 2024-01-23 NOTE — Telephone Encounter (Signed)
 FYI Only or Action Required?: FYI only for provider.  Patient was last seen in primary care on 12/30/2023 by Whitney Baller, MD.  Called Nurse Triage reporting Chest Pain and Fall.  Symptoms began several days ago.  Interventions attempted: OTC medications: Tylenol, aleve, naproxen.  Symptoms are: gradually worsening.  Triage Disposition: Go to ED Now (or PCP Triage)  Patient/caregiver understands and will follow disposition?: Yes  Copied from CRM [id number]. Topic: Clinical - Red Word Triage >> Jan 23, 2024  3:44 PM Vena HERO wrote: Red Word that prompted transfer to Nurse Triage: fall on 10/20(?), starting to get stabbing in chest and unsure if related to fall, also skinned knee and hurt right from shoulder all the way down to knee Reason for Disposition  Taking a deep breath makes pain worse  Answer Assessment - Initial Assessment Questions Clemens on 10/20, tripped and fell on concrete, hit right knee, right shoulder, right chest area. Has not been seen by provider yet. Scrape on knee, no other cuts, bruises or swelling. Advised UC, pt reports her husband could take her today.  1. LOCATION: Where does it hurt?       Right chest above breast  2. RADIATION: Does the pain go anywhere else? (e.g., into neck, jaw, arms, back)     No  3. ONSET: When did the chest pain begin? (Minutes, hours or days)      2-3 days ago  4. PATTERN: Does the pain come and go, or has it been constant since it started?  Does it get worse with exertion?      Only when moving a certain way or coughing  5. DURATION: How long does it last (e.g., seconds, minutes, hours)       6. SEVERITY: How bad is the pain?  (e.g., Scale 1-10; mild, moderate, or severe)     8-9/10 sharp stabbing pain when taking deep breath, coughing or with certain movements, 4/10 pain when resting.  7. CARDIAC RISK FACTORS: Do you have any history of heart problems or risk factors for heart disease? (e.g., angina, prior  heart attack; diabetes, high blood pressure, high cholesterol, smoker, or strong family history of heart disease)     Pericarditis  8. PULMONARY RISK FACTORS: Do you have any history of lung disease?  (e.g., blood clots in lung, asthma, emphysema, birth control pills)     No  9. CAUSE: What do you think is causing the chest pain?     Possibly r/t recent fall.  10. OTHER SYMPTOMS: Do you have any other symptoms? (e.g., dizziness, nausea, vomiting, sweating, fever, difficulty breathing, cough)       No  11. PREGNANCY: Is there any chance you are pregnant? When was your last menstrual period?       No  Protocols used: Chest Pain-A-AH

## 2024-01-24 ENCOUNTER — Ambulatory Visit (HOSPITAL_COMMUNITY): Payer: Self-pay

## 2024-01-24 NOTE — Telephone Encounter (Signed)
 In her history of chronic statin use, cardiac CT with calcium  score is not as useful.  Would suggest f/u with cardiology if ongoing chest pain - looks like she cancelled previously scheduled stress test (Harwani) due to insurance concerns.

## 2024-01-24 NOTE — Telephone Encounter (Addendum)
 Patient was seen at urgent care last night. She is feeling better from fall. She wanted to know if Calcium  score CT would be helpful.

## 2024-01-24 NOTE — Telephone Encounter (Signed)
 Called patient, reviewed all information and states she will f/u with cardiology. Will call if any questions.

## 2024-02-01 ENCOUNTER — Ambulatory Visit

## 2024-02-02 ENCOUNTER — Ambulatory Visit (INDEPENDENT_AMBULATORY_CARE_PROVIDER_SITE_OTHER)

## 2024-02-02 DIAGNOSIS — Z23 Encounter for immunization: Secondary | ICD-10-CM

## 2024-02-02 NOTE — Progress Notes (Signed)
 Patient presented for Pneumonia 20 vaccine given by Harlene Arenas, CMA to left deltoid, patient voiced no concerns nor showed any signs of distress during injection.

## 2024-02-02 NOTE — Addendum Note (Signed)
 Addended by: TENNIE RAISIN B on: 02/02/2024 02:57 PM   Modules accepted: Orders

## 2024-03-14 ENCOUNTER — Other Ambulatory Visit: Payer: Self-pay | Admitting: Family Medicine

## 2024-03-14 DIAGNOSIS — M51369 Other intervertebral disc degeneration, lumbar region without mention of lumbar back pain or lower extremity pain: Secondary | ICD-10-CM

## 2024-03-15 NOTE — Telephone Encounter (Signed)
 Patient last received 30 from you on 10/2 then 20 on 10/27 from another provider. Ok to refill?

## 2024-03-28 DIAGNOSIS — R1319 Other dysphagia: Secondary | ICD-10-CM

## 2024-04-16 ENCOUNTER — Encounter: Payer: Self-pay | Admitting: Family Medicine

## 2024-04-16 DIAGNOSIS — R0789 Other chest pain: Secondary | ICD-10-CM

## 2024-04-16 DIAGNOSIS — B3323 Viral pericarditis: Secondary | ICD-10-CM

## 2024-04-16 DIAGNOSIS — E1169 Type 2 diabetes mellitus with other specified complication: Secondary | ICD-10-CM

## 2024-04-21 ENCOUNTER — Encounter: Payer: Self-pay | Admitting: Oncology

## 2024-04-22 ENCOUNTER — Encounter: Payer: Self-pay | Admitting: Oncology

## 2024-04-26 ENCOUNTER — Ambulatory Visit: Payer: Self-pay

## 2024-05-04 ENCOUNTER — Ambulatory Visit: Admitting: Cardiovascular Disease

## 2024-05-04 ENCOUNTER — Ambulatory Visit: Admission: RE | Admit: 2024-05-04 | Payer: Self-pay | Source: Ambulatory Visit

## 2024-05-04 ENCOUNTER — Encounter: Payer: Self-pay | Admitting: Cardiovascular Disease

## 2024-05-04 VITALS — BP 108/76 | HR 84 | Ht 67.0 in | Wt 168.0 lb

## 2024-05-04 DIAGNOSIS — E785 Hyperlipidemia, unspecified: Secondary | ICD-10-CM

## 2024-05-04 DIAGNOSIS — B3323 Viral pericarditis: Secondary | ICD-10-CM

## 2024-05-04 DIAGNOSIS — E1169 Type 2 diabetes mellitus with other specified complication: Secondary | ICD-10-CM

## 2024-05-04 NOTE — Progress Notes (Signed)
 Cardiology Office Note  Date:  05/04/2024   ID:  Whitney [name], DOB [birth year], MRN [id number]  PCP:  Rilla Baller, MD   Chief Complaint  Patient presents with   New Patient (Initial Visit)  - Chest pain, dizziness/fogginess  HPI:  Whitney [name] is a 64 y.o. female with past medical history of: Past Medical History:  Diagnosis Date   Allergy    Seasonal   Anemia    Anxiety    Attention deficit disorder without mention of hyperactivity    Barrett's esophagus    Cancer (HCC)    skin cancer on nose   CHOLELITHIASIS 05/26/2007   Qualifier: Diagnosis of  By: Baird FNP, Billie-Lynn Daniels    Congenital anomalies of foot, not elsewhere classified    CONSTIPATION 10/09/2008   Qualifier: Diagnosis of  By: Baird FNP, Frieda Kipper    Depression    Diabetes mellitus without complication (HCC)    Displacement of intervertebral disc, site unspecified, without myelopathy    DIVERTICULAR DISEASE 02/06/2002   Qualifier: Diagnosis of  By: Baird FNP, Billie-Lynn Kipper    Diverticulosis    Elevated blood pressure reading without diagnosis of hypertension    Esophageal reflux    Gallstones    Gastroenteritis 07/27/2016   Hiatal hernia    HIATAL HERNIA 10/16/2008   Qualifier: Diagnosis of  By: Baird FNP, Frieda Kipper    HLD (hyperlipidemia)    Hypoglycemia, unspecified    Internal hemorrhoids without mention of complication    Other abnormal blood chemistry   Syncope prior stress tests dating  2006, cardiac catheterization in March 2006 showing no significant CAD  Seen by myself in clinic in 2015 diabetes Who presents by referral from Dr. Rilla for viral pericarditis, hyperlipidemia, chest discomfort, diabetes  Review of prior notes indicates chest pain February 2022, seen in the ER COVID infection end of January Had left-sided chest pain since that time Also reported brain fog for quite some time worse since COVID with malaise fatigue chronic headaches detailed on  her ER visit - Workup reviewed troponin negative, x-ray nonacute, EKG nonacute Discharged home with atypical chest pain  Reports that she was seen by Dr. Levern in [place], told that she had viral pericarditis and was started on colchicine and metoprolol She has been taking these medications for the past 3 years at his direction  Reports continued fatigue, weakness, dizziness, and brain fog  Foggy 1 year Blurry vision at times, scheduled to see ophthalmology  Occasional chest heaviness when walking up stairs and under stress No regular exercise program, gets too fatigued  Trouble exercising, heaviness in the chest  Problems with chronic neuropathy  Concerned about medication side effects particularly metformin   Stress test 2022: low risk Carotid 2017: mild disease  Family hx:  father cad, cabg, smoker Grandmother with CHF  EKG personally reviewed by myself on todays visit EKG Interpretation Date/Time:  Friday May 04 2024 09:38:20 EST Ventricular Rate:  84 PR Interval:  136 QRS Duration:  80 QT Interval:  380 QTC Calculation: 449 R Axis:   -6  Text Interpretation: Normal sinus rhythm Normal ECG When compared with ECG of 20-May-2020 16:31, No significant change was found Confirmed by Perla Lye [phone]) on 05/04/2024 9:45:12 AM    PMH:   has a past medical history of Allergy, Anemia, Anxiety, Attention deficit disorder without mention of hyperactivity, Barrett's esophagus, Cancer (HCC), CHOLELITHIASIS (05/26/2007), Congenital anomalies of foot, not elsewhere classified, CONSTIPATION (10/09/2008), Depression, Diabetes mellitus without complication (HCC),  Displacement of intervertebral disc, site unspecified, without myelopathy, DIVERTICULAR DISEASE (02/06/2002), Diverticulosis, Elevated blood pressure reading without diagnosis of hypertension, Esophageal reflux, Gallstones, Gastroenteritis (07/27/2016), Hiatal hernia, HIATAL HERNIA (10/16/2008), HLD (hyperlipidemia),  Hypoglycemia, unspecified, Internal hemorrhoids without mention of complication, and Other abnormal blood chemistry.   PSH:    Past Surgical History:  Procedure Laterality Date   BREAST CYST EXCISION Left    CARDIAC CATHETERIZATION     [facility]   CESAREAN SECTION     CHOLECYSTECTOMY  06/16/2007   Dr. Kimble   COLONOSCOPY  02/06/2002   diverticula and internal hemorroids   COLONOSCOPY  2015   COLONOSCOPY  03/2020   diverticulosis, tortuous colon, int hem, rpt 10 yrs ([facility]).   ESOPHAGEAL MANOMETRY N/A 11/16/2023   normal relaxation of EG junction, ineffective seophageal motility with poor bolus clearance likely 2/2 HH, adequate contractile reserve ([facility], Elspeth SQUIBB, MD)   ESOPHAGOGASTRODUODENOSCOPY  03/2020   recurrent large HH with esophagitis with ulceration ([facility])   HERNIA REPAIR     HIATAL HERNIA REPAIR     LUMBAR LAMINECTOMY     L5-S1   SPINE SURGERY     Lower back L4 & 5 plus S1    Current Outpatient Medications  Medication Sig Dispense Refill   colchicine 0.6 MG tablet Take 1 tablet (0.6 mg total) by mouth daily.     cyclobenzaprine  (FLEXERIL ) 5 MG tablet TAKE 1 TABLET BY MOUTH AT BEDTIME AS NEEDED FOR MUSCLE SPASMS. (Patient taking differently: as needed.) 30 tablet 1   Dulaglutide  (TRULICITY ) 3 MG/0.5ML SOAJ Inject 3 mg as directed once a week. 6 mL 2   ezetimibe (ZETIA) 10 MG tablet Take 10 mg by mouth daily.     insulin  glargine-yfgn (SEMGLEE ) 100 UNIT/ML Pen Inject 5 Units into the skin daily. 3 mL 3   Insulin  Pen Needle 31G X 5 MM MISC Use as directed to inject Semglee  100 each 3   JARDIANCE 25 MG TABS tablet Take 25 mg by mouth daily.     metFORMIN  (GLUCOPHAGE -XR) 500 MG 24 hr tablet Take 1 tablet (500 mg total) by mouth daily with breakfast AND 2 tablets (1,000 mg total) daily with supper. 270 tablet 3   metoprolol succinate (TOPROL-XL) 25 MG 24 hr tablet Take 25 mg by mouth daily.     omeprazole  (PRILOSEC) 40 MG capsule Take 1 capsule (40 mg total) by  mouth 2 (two) times daily before lunch and supper. 180 capsule 3   triamcinolone  cream (KENALOG ) 0.1 % APPLY 1 APPLICATION TOPICALLY 2 (TWO) TIMES DAILY. (Patient taking differently: Apply 1 application  topically as needed.) 30 g 1   ACCU-CHEK GUIDE TEST test strip USE AS DIRECTED TO CHECK BLOOD SUGAR ONCE DAILY 100 strip 12   Accu-Chek Softclix Lancets lancets USE AS DIRECTED TO CHECK BLOOD SUGAR ONCE DAILY 100 each 3   ASPIRIN PO Take 1 tablet by mouth daily.     atorvastatin (LIPITOR) 40 MG tablet Take 40 mg by mouth daily.     Biotin 89999 MCG TBDP Take by mouth. Take one by mouth daily     Cholecalciferol (VITAMIN D3) 50 MCG (2000 UT) capsule Take 2,000 Units by mouth daily. (Patient not taking: Reported on 05/04/2024)     Coenzyme Q10 (CO Q10 PO) Take by mouth daily. (Patient not taking: Reported on 05/04/2024)     Cyanocobalamin  (VITAMIN B-12) 1000 MCG SUBL Place under the tongue daily. Liquid (Patient not taking: Reported on 05/04/2024)     estradiol (ESTRACE) 0.1 MG/GM  vaginal cream Place 1 Applicatorful vaginally 3 (three) times a week. (Patient not taking: Reported on 05/04/2024)     fluticasone  (FLONASE ) 50 MCG/ACT nasal spray PLACE 2 SPRAYS INTO BOTH NOSTRILS AS NEEDED FOR ALLERGIES. (Patient not taking: Reported on 05/04/2024) 48 mL 1   magnesium  oxide (MAG-OX) 400 (240 Mg) MG tablet Take 400 mg by mouth daily. (Patient not taking: Reported on 05/04/2024)     Multiple Vitamins-Minerals (EQ MULTIVITAMINS ADULT GUMMY PO) Take by mouth. Take two by month daily (Patient not taking: Reported on 05/04/2024)     zinc gluconate 50 MG tablet Take 50 mg by mouth daily. (Patient not taking: Reported on 05/04/2024)     No current facility-administered medications for this visit.     Allergies:   Sertraline hcl, Pollen extract, and Jaysion Ramseyer grass pollen allergen   Social History:  The patient  reports that she quit smoking about 47 years ago. Her smoking use included cigarettes. She has never used smokeless  tobacco. She reports that she does not currently use alcohol. She reports that she does not use drugs.   Family History:   family history includes Anxiety disorder in her sister; Brain cancer (age of onset: 36) in her father; Colon cancer in her paternal grandmother; Colon polyps in her maternal grandmother and mother; Depression in her maternal uncle; Diabetes in her father and maternal aunt; Heart attack in her maternal grandmother and paternal grandfather; Heart disease in her father; Hypertension in her father; Prostate cancer in her maternal grandfather.    Review of Systems: Review of Systems  Constitutional: Negative.   HENT: Negative.    Respiratory: Negative.    Cardiovascular: Negative.   Gastrointestinal: Negative.   Musculoskeletal: Negative.   Neurological: Negative.   Psychiatric/Behavioral: Negative.    All other systems reviewed and are negative.  PHYSICAL EXAM: VS:  BP 108/76 (BP Location: Right Arm, Patient Position: Sitting, Cuff Size: Normal)   Pulse 84   Ht 5' 7 (1.702 m)   Wt 168 lb (76.2 kg)   LMP 06/17/2011   SpO2 97%   BMI 26.31 kg/m  , BMI Body mass index is 26.31 kg/m. GEN: Well nourished, well developed, in no acute distress HEENT: normal Neck: no JVD, carotid bruits, or masses Cardiac: RRR; no murmurs, rubs, or gallops,no edema  Respiratory:  clear to auscultation bilaterally, normal work of breathing GI: soft, nontender, nondistended, + BS MS: no deformity or atrophy Skin: warm and dry, no rash Neuro:  Strength and sensation are intact Psych: euthymic mood, full affect  Recent Labs: 12/30/2023: ALT 31; BUN 17; Creat 0.87; Hemoglobin 17.6; Platelets 399; Potassium 4.4; Sodium 138; TSH 1.82   Lipid Panel Lab Results  Component Value Date   CHOL 121 09/20/2023   HDL 53 09/20/2023   LDLCALC 42 09/20/2023   TRIG 184 (A) 09/20/2023    Wt Readings from Last 3 Encounters:  05/04/24 168 lb (76.2 kg)  12/30/23 165 lb 4 oz (75 kg)  09/23/23 171  lb 6 oz (77.7 kg)     ASSESSMENT AND PLAN:  Problem List Items Addressed This Visit       Cardiology Problems   Hyperlipidemia associated with type 2 diabetes mellitus (HCC)   Viral pericarditis - Primary   Relevant Orders   EKG 12-Lead (Completed)     Other   Type 2 diabetes mellitus with other specified complication (HCC)   History of chest pain Reports she was previously diagnosed with viral pericarditis in 2022 by Dr. Levern -  She denies any recent symptoms concerning for pericarditis at this time - Suggested she stop the metoprolol and colchicine - Chest pain atypical in nature - CT calcium  score for risk stratification  Essential hypertension Blood pressure low on today's visit 106 systolic Reports having some lightheadedness when she sits up, stands up -Recommend she hold the metoprolol succinate - Continue to monitor blood pressure at home  Hyperlipidemia Currently on Lipitor 40 daily Zetia 10 daily Cholesterol is at goal on the current lipid regimen. No changes to the medications were made.  Fogginess/dizziness Suggest she hold the metoprolol, blood pressure running low Suggest she stay hydrated Similar symptoms noted when she was seen in the emergency room February 2022 following COVID -Less likely cardiac etiology given symptoms at rest, change in position  Fatigue/exhaustion Poor exercise tolerance Recommend slow graded exercise program for conditioning Has [employer] at home, recommend she start there and also start walking program on a regular basis   Signed, Velinda Lunger, M.D., Ph.D. [facility] [place], [place] [phone]

## 2024-05-04 NOTE — Patient Instructions (Addendum)
 Medication Instructions:   Please stop the metoprolol and colchicine  If you need a refill on your cardiac medications before your next appointment, please call your pharmacy.   Lab work: No new labs needed  Testing/Procedures:  CT coronary calcium  score.   - $99 out of pocket cost at the time of your test - Call [phone] to schedule at your convenience.  [facility] [address] [place], [place] [address]   Coronary CalciumScan A coronary calcium  scan is an imaging test used to look for deposits of calcium  and other fatty materials (plaques) in the inner lining of the blood vessels of the heart (coronary arteries). These deposits of calcium  and plaques can partly clog and narrow the coronary arteries without producing any symptoms or warning signs. This puts a person at risk for a heart attack. This test can detect these deposits before symptoms develop. Tell a health care provider about: Any allergies you have. All medicines you are taking, including vitamins, herbs, eye drops, creams, and over-the-counter medicines. Any problems you or family members have had with anesthetic medicines. Any blood disorders you have. Any surgeries you have had. Any medical conditions you have. Whether you are pregnant or may be pregnant. What are the risks? Generally, this is a safe procedure. However, problems may occur, including: Harm to a pregnant woman and her unborn baby. This test involves the use of radiation. Radiation exposure can be dangerous to a pregnant woman and her unborn baby. If you are pregnant, you generally should not have this procedure done. Slight increase in the risk of cancer. This is because of the radiation involved in the test. What happens before the procedure? No preparation is needed for this procedure. What happens during the procedure? You will undress and remove any jewelry around your neck or chest. You will  put on a hospital gown. Sticky electrodes will be placed on your chest. The electrodes will be connected to an electrocardiogram (ECG) machine to record a tracing of the electrical activity of your heart. A CT scanner will take pictures of your heart. During this time, you will be asked to lie still and hold your breath for 2-3 seconds while a picture of your heart is being taken. The procedure may vary among health care providers and hospitals. What happens after the procedure? You can get dressed. You can return to your normal activities. It is up to you to get the results of your test. Ask your health care provider, or the department that is doing the test, when your results will be ready. Summary A coronary calcium  scan is an imaging test used to look for deposits of calcium  and other fatty materials (plaques) in the inner lining of the blood vessels of the heart (coronary arteries). Generally, this is a safe procedure. Tell your health care provider if you are pregnant or may be pregnant. No preparation is needed for this procedure. A CT scanner will take pictures of your heart. You can return to your normal activities after the scan is done. This information is not intended to replace advice given to you by your health care provider. Make sure you discuss any questions you have with your health care provider. Document Released: 09/11/2007 Document Revised: 02/02/2016 Document Reviewed: 02/02/2016 Elsevier Interactive Patient Education  2017 [employer].   Follow-Up: At [facility], you and your health needs are our priority.  As part of our continuing mission to provide you with exceptional heart  care, we have created designated Provider Care Teams.  These Care Teams include your primary Cardiologist (physician) and Advanced Practice Providers (APPs -  Physician Assistants and Nurse Practitioners) who all work together to provide you with the care you need, when you need it.  You will  need a follow up appointment as needed  Providers on your designated Care Team:   Lonni Meager, NP Bernardino Bring, PA-C Cadence [place], [place]  COVID-19 Vaccine Information can be found at: [email or web address] For questions related to vaccine distribution or appointments, please email [email or web address] or call [phone].
